# Patient Record
Sex: Male | Born: 1955 | Race: White | Hispanic: No | Smoking: Never smoker
Health system: Southern US, Community
[De-identification: ages and names within clinical notes are randomized; demographics above are authoritative.]

## PROBLEM LIST (undated history)

## (undated) DIAGNOSIS — Z973 Presence of spectacles and contact lenses: Secondary | ICD-10-CM

## (undated) DIAGNOSIS — C61 Malignant neoplasm of prostate: Secondary | ICD-10-CM

## (undated) DIAGNOSIS — Z8679 Personal history of other diseases of the circulatory system: Secondary | ICD-10-CM

## (undated) DIAGNOSIS — Z8781 Personal history of (healed) traumatic fracture: Secondary | ICD-10-CM

## (undated) DIAGNOSIS — R002 Palpitations: Secondary | ICD-10-CM

## (undated) DIAGNOSIS — Z87442 Personal history of urinary calculi: Secondary | ICD-10-CM

## (undated) DIAGNOSIS — I4891 Unspecified atrial fibrillation: Secondary | ICD-10-CM

## (undated) HISTORY — PX: APPENDECTOMY: SHX54

## (undated) HISTORY — PX: OTHER SURGICAL HISTORY: SHX169

---

## 1998-02-09 ENCOUNTER — Ambulatory Visit (HOSPITAL_COMMUNITY): Admission: RE | Admit: 1998-02-09 | Discharge: 1998-02-09 | Payer: Self-pay | Admitting: Orthopedic Surgery

## 2003-01-09 ENCOUNTER — Emergency Department (HOSPITAL_COMMUNITY): Admission: EM | Admit: 2003-01-09 | Discharge: 2003-01-10 | Payer: Self-pay | Admitting: Emergency Medicine

## 2003-03-08 ENCOUNTER — Emergency Department (HOSPITAL_COMMUNITY): Admission: EM | Admit: 2003-03-08 | Discharge: 2003-03-08 | Payer: Self-pay | Admitting: Emergency Medicine

## 2003-03-08 ENCOUNTER — Encounter: Payer: Self-pay | Admitting: Emergency Medicine

## 2015-01-25 ENCOUNTER — Ambulatory Visit: Payer: Self-pay | Admitting: Orthopedic Surgery

## 2015-01-26 ENCOUNTER — Ambulatory Visit (HOSPITAL_COMMUNITY)
Admission: RE | Admit: 2015-01-26 | Discharge: 2015-01-26 | Disposition: A | Discharge status: Home / Self Care | Payer: BLUE CROSS/BLUE SHIELD | Source: Ambulatory Visit | Attending: Orthopedic Surgery | Admitting: Orthopedic Surgery

## 2015-01-26 ENCOUNTER — Encounter (HOSPITAL_COMMUNITY): Payer: Self-pay

## 2015-01-26 ENCOUNTER — Ambulatory Visit: Payer: Self-pay | Admitting: Orthopedic Surgery

## 2015-01-26 ENCOUNTER — Encounter (HOSPITAL_COMMUNITY)
Admission: RE | Admit: 2015-01-26 | Discharge: 2015-01-26 | Disposition: A | Discharge status: Home / Self Care | Payer: BLUE CROSS/BLUE SHIELD | Source: Ambulatory Visit | Attending: Specialist | Admitting: Specialist

## 2015-01-26 DIAGNOSIS — M5126 Other intervertebral disc displacement, lumbar region: Secondary | ICD-10-CM | POA: Insufficient documentation

## 2015-01-26 DIAGNOSIS — Z01812 Encounter for preprocedural laboratory examination: Secondary | ICD-10-CM | POA: Diagnosis not present

## 2015-01-26 DIAGNOSIS — Z01818 Encounter for other preprocedural examination: Secondary | ICD-10-CM | POA: Diagnosis not present

## 2015-01-26 LAB — CBC
HCT: 49.5 % (ref 39.0–52.0)
HEMOGLOBIN: 16.8 g/dL (ref 13.0–17.0)
MCH: 31.3 pg (ref 26.0–34.0)
MCHC: 33.9 g/dL (ref 30.0–36.0)
MCV: 92.4 fL (ref 78.0–100.0)
Platelets: 191 10*3/uL (ref 150–400)
RBC: 5.36 MIL/uL (ref 4.22–5.81)
RDW: 12.8 % (ref 11.5–15.5)
WBC: 6.8 10*3/uL (ref 4.0–10.5)

## 2015-01-26 LAB — BASIC METABOLIC PANEL
Anion gap: 9 (ref 5–15)
BUN: 19 mg/dL (ref 6–23)
CALCIUM: 9.2 mg/dL (ref 8.4–10.5)
CO2: 29 mmol/L (ref 19–32)
CREATININE: 1.26 mg/dL (ref 0.50–1.35)
Chloride: 99 mmol/L (ref 96–112)
GFR calc Af Amer: 71 mL/min — ABNORMAL LOW (ref >90)
GFR calc non Af Amer: 61 mL/min — ABNORMAL LOW (ref >90)
Glucose, Bld: 100 mg/dL — ABNORMAL HIGH (ref 70–99)
Potassium: 4.5 mmol/L (ref 3.5–5.1)
SODIUM: 137 mmol/L (ref 135–145)

## 2015-01-26 LAB — SURGICAL PCR SCREEN
MRSA, PCR: NEGATIVE
Staphylococcus aureus: NEGATIVE

## 2015-01-26 NOTE — Progress Notes (Signed)
Your patient has screened at an elevated risk for Obstructive Sleep Apnea using the Stop-Bang Tool during a pre-surgical vist. A score of 4 or greater is an elevated risk. Score of 4. 

## 2015-01-26 NOTE — H&P (Signed)
Arthur Barrett is an 59 y.o. male.   Chief Complaint: back and L leg pain HPI: The patient is a 59 year old male who presents today for follow up of their back. The patient is being followed for their low back pain. They are now 4 week(s) out from injury. Symptoms reported today include: pain. Current treatment includes: NSAIDs (Mobic). The following medication has been used for pain control: Percocet. The patient presents today following MRI.  [name] follows up to discuss his MRI. He actually has a disc herniation at L5-S1, compressing his S1 nerve root. He reports increasing numbness and pain radiating down to the leg, requiring Percocet. He is here with his wife. He has significantly reduced his level of activity. Has not had much in terms of improvement.    No past medical history on file.  Past Surgical History  Procedure Laterality Date  . Appendectomy    . Colonscopy       No family history on file. Social History:  reports that he has never smoked. He has never used smokeless tobacco. He reports that he does not drink alcohol or use illicit drugs.  Allergies: No Known Allergies   (Not in a hospital admission)  Results for orders placed or performed during the hospital encounter of 01/26/15 (from the past 48 hour(s))  Surgical pcr screen     Status: None   Collection Time: 01/26/15  9:31 AM  Result Value Ref Range   MRSA, PCR NEGATIVE NEGATIVE   Staphylococcus aureus NEGATIVE NEGATIVE    Comment:        The Xpert SA Assay (FDA approved for NASAL specimens in patients over 65 years of age), is one component of a comprehensive surveillance program.  Test performance has been validated by [facility] for patients greater than or equal to 77 year old. It is not intended to diagnose infection nor to guide or monitor treatment.   Basic metabolic panel     Status: Abnormal   Collection Time: 01/26/15  9:50 AM  Result Value Ref Range   Sodium 137 135 - 145  mmol/L   Potassium 4.5 3.5 - 5.1 mmol/L   Chloride 99 96 - 112 mmol/L   CO2 29 19 - 32 mmol/L   Glucose, Bld 100 (H) 70 - 99 mg/dL   BUN 19 6 - 23 mg/dL   Creatinine, Ser 1.26 0.50 - 1.35 mg/dL   Calcium 9.2 8.4 - 10.5 mg/dL   GFR calc non Af Amer 61 (L) >90 mL/min   GFR calc Af Amer 71 (L) >90 mL/min    Comment: (NOTE) The eGFR has been calculated using the CKD EPI equation. This calculation has not been validated in all clinical situations. eGFR's persistently <90 mL/min signify possible Chronic Kidney Disease.    Anion gap 9 5 - 15  CBC     Status: None   Collection Time: 01/26/15  9:50 AM  Result Value Ref Range   WBC 6.8 4.0 - 10.5 K/uL   RBC 5.36 4.22 - 5.81 MIL/uL   Hemoglobin 16.8 13.0 - 17.0 g/dL   HCT 49.5 39.0 - 52.0 %   MCV 92.4 78.0 - 100.0 fL   MCH 31.3 26.0 - 34.0 pg   MCHC 33.9 30.0 - 36.0 g/dL   RDW 12.8 11.5 - 15.5 %   Platelets 191 150 - 400 K/uL   Dg Lumbar Spine 2-3 Views  01/26/2015   CLINICAL DATA:  59 year old male preoperative study for spine surgery.  Initial encounter.  EXAM: LUMBAR SPINE - 2-3 VIEW  COMPARISON:  None.  FINDINGS: Normal lumbar segmentation, with small ribs at T12 and full size ribs at T11. Mild to moderate dextro convex lumbar scoliosis. Chronic wedging of the L3 vertebral body. Incidental spina bifida occulta at L5. No acute osseous abnormality identified.  IMPRESSION: No acute osseous abnormality identified in the lumbar spine. Normal lumbar segmentation. The lumbar levels on these images were numbered in anticipation of surgery.   Electronically Signed   By: Genevie Ann M.D.   On: 01/26/2015 10:15    Review of Systems  Constitutional: Negative.   HENT: Negative.   Eyes: Negative.   Respiratory: Negative.   Cardiovascular: Negative.   Gastrointestinal: Negative.   Genitourinary: Negative.   Musculoskeletal: Positive for back pain.  Skin: Negative.   Neurological: Positive for sensory change and focal weakness.   Psychiatric/Behavioral: Negative.     There were no vitals taken for this visit. Physical Exam  Constitutional: He is oriented to person, place, and time. He appears well-developed and well-nourished.  HENT:  Head: Normocephalic and atraumatic.  Eyes: Conjunctivae and EOM are normal. Pupils are equal, round, and reactive to light.  Neck: Normal range of motion. Neck supple.  Cardiovascular: Normal rate and regular rhythm.   Respiratory: Effort normal and breath sounds normal.  GI: Soft. Bowel sounds are normal.  Musculoskeletal:  On exam, moderate distress. Walks with an antalgic gait. Mood and affect is anxious. His straight leg raises, buttocks, thigh, and calf pain on the left, negative on the right. He has an absent Achilles reflex on the left. He has decreased sensation on S1 dermatome. Diminished plantar flexion, EHL weakness. Pain will essentially remain in the lumbar spine. Lumbar spine exam reveals no evidence of soft tissue swelling, deformity or skin ecchymosis. On palpation there is no tenderness of the lumbar spine. No flank pain with percussion. The abdomen is soft and nontender. Nontender over the trochanters. No cellulitis or lymphadenopathy. Good range of motion of the lumbar spine. Straight leg raise is negative. Motor is 5/5 including tibialis anterior, quadriceps and hamstrings. Patient is normoreflexic. There is no Babinski or clonus. Patient has good distal pulses. No DVT. No pain and normal range of motion without instability of the hips, knees and ankles.   Neurological: He is alert and oriented to person, place, and time. He displays abnormal reflex.  Skin: Skin is warm and dry.    MRI demonstrates a paracentral disc herniation at L5-S1 with an extruded fragment compressing the S1 nerve root. There is perhaps a Schmorl node at S1. L3 has a chronic fracture.  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  Given the severity of this pain, the presence of a neurologic deficit as well as a neural compressive lesion at four weeks status post, it would be wise to consider lumbar decompressions.  I had an extensive discussion of the risks and benefits of the lumbar decompression with the patient including bleeding, infection, damage to neurovascular structures, epidural fibrosis, CSF leak requiring repair. We also discussed increase in pain, adjacent segment disease, recurrent disc herniation, need for future surgery including repeat decompression and/or fusion. We also discussed risks of postoperative hematoma, paralysis, anesthetic complications including DVT, PE, death, cardiopulmonary dysfunction. In addition, the perioperative and postoperative courses were discussed in detail including the rehabilitative time and  return to functional activity and work. I provided the patient with an illustrated handout and utilized the appropriate surgical models.  I have sent him to physical therapy first for a visit to see if there is any help that can do with a Mckenzie type of an approach, but he has been fairly painful. His L3 fracture is old and is not new. I discussed this with he and his wife. I did discuss the postoperative course in detail overnight in the hospital. We did discuss it would take some time for the nerve to recover. I do feel it will.  Plan microlumbar decompression L5-S1 left  Aundrey Elahi M. [name] for Dr. Tonita Cong 01/26/2015, 2:31 PM

## 2015-01-26 NOTE — Patient Instructions (Signed)
[place]  01/26/2015   Your procedure is scheduled on:    Wednesday January 31, 2015   Report to [facility] Main Entrance and follow signs to  [place] arrive at 9:30 AM.   Call this number if you have problems the morning of surgery [phone] or Presurgical Testing [phone].   Remember:  Do not eat food or drink liquids :After Midnight.  For Living Will and/or Health Care Power Attorney Forms: please provide copy for your medical record, may bring AM of surgery (forms should be already notarized-we do not provide this service).     Take these medicines the morning of surgery with A SIP OF WATER: Oxycodone-Acetaminophen if needed                               You may not have any metal on your body including hair pins and piercings  Do not wear jewelry, colognes,lotions, powders, or deodorant.  Men may shave face and neck.               Do not bring valuables to the hospital. [place].  Contacts, dentures or bridgework may not be worn into surgery.  Leave suitcase in the car. After surgery it may be brought to your room.  For patients admitted to the hospital, checkout time is 11:00 AM the day of discharge.     Special Instructions: review fact sheets for MRSA information, Incentive Spirometry. ________________________________________________________________________  [facility] - Preparing for Surgery Before surgery, you can play an important role.  Because skin is not sterile, your skin needs to be as free of germs as possible.  You can reduce the number of germs on your skin by washing with CHG (chlorahexidine gluconate) soap before surgery.  CHG is an antiseptic cleaner which kills germs and bonds with the skin to continue killing germs even after washing. Please DO NOT use if you have an allergy to CHG or antibacterial soaps.  If your skin becomes reddened/irritated stop using the CHG and inform your nurse when  you arrive at Short Stay. Do not shave (including legs and underarms) for at least 48 hours prior to the first CHG shower.  You may shave your face/neck. Please follow these instructions carefully:  1.  Shower with CHG Soap the night before surgery and the  morning of Surgery.  2.  If you choose to wash your hair, wash your hair first as usual with your  normal  shampoo.  3.  After you shampoo, rinse your hair and body thoroughly to remove the  shampoo.                           4.  Use CHG as you would any other liquid soap.  You can apply chg directly  to the skin and wash                       Gently with a scrungie or clean washcloth.  5.  Apply the CHG Soap to your body ONLY FROM THE NECK DOWN.   Do not use on face/ open                           Wound or open sores. Avoid contact with eyes, ears mouth and genitals (private  parts).                       Wash face,  Genitals (private parts) with your normal soap.             6.  Wash thoroughly, paying special attention to the area where your surgery  will be performed.  7.  Thoroughly rinse your body with warm water from the neck down.  8.  DO NOT shower/wash with your normal soap after using and rinsing off  the CHG Soap.                9.  Pat yourself dry with a clean towel.            10.  Wear clean pajamas.            11.  Place clean sheets on your bed the night of your first shower and do not  sleep with pets. Day of Surgery : Do not apply any lotions/deodorants the morning of surgery.  Please wear clean clothes to the hospital/surgery center.  FAILURE TO FOLLOW THESE INSTRUCTIONS MAY RESULT IN THE CANCELLATION OF YOUR SURGERY PATIENT SIGNATURE_________________________________  NURSE SIGNATURE__________________________________  ________________________________________________________________________   Adam Phenix  An incentive spirometer is a tool that can help keep your lungs clear and active. This tool measures  how well you are filling your lungs with each breath. Taking long deep breaths may help reverse or decrease the chance of developing breathing (pulmonary) problems (especially infection) following:  A long period of time when you are unable to move or be active. BEFORE THE PROCEDURE   If the spirometer includes an indicator to show your best effort, your nurse or respiratory therapist will set it to a desired goal.  If possible, sit up straight or lean slightly forward. Try not to slouch.  Hold the incentive spirometer in an upright position. INSTRUCTIONS FOR USE  1. Sit on the edge of your bed if possible, or sit up as far as you can in bed or on a chair. 2. Hold the incentive spirometer in an upright position. 3. Breathe out normally. 4. Place the mouthpiece in your mouth and seal your lips tightly around it. 5. Breathe in slowly and as deeply as possible, raising the piston or the ball toward the top of the column. 6. Hold your breath for 3-5 seconds or for as long as possible. Allow the piston or ball to fall to the bottom of the column. 7. Remove the mouthpiece from your mouth and breathe out normally. 8. Rest for a few seconds and repeat Steps 1 through 7 at least 10 times every 1-2 hours when you are awake. Take your time and take a few normal breaths between deep breaths. 9. The spirometer may include an indicator to show your best effort. Use the indicator as a goal to work toward during each repetition. 10. After each set of 10 deep breaths, practice coughing to be sure your lungs are clear. If you have an incision (the cut made at the time of surgery), support your incision when coughing by placing a pillow or rolled up towels firmly against it. Once you are able to get out of bed, walk around indoors and cough well. You may stop using the incentive spirometer when instructed by your caregiver.  RISKS AND COMPLICATIONS  Take your time so you do not get dizzy or light-headed.  If  you are in pain, you may need  to take or ask for pain medication before doing incentive spirometry. It is harder to take a deep breath if you are having pain. AFTER USE  Rest and breathe slowly and easily.  It can be helpful to keep track of a log of your progress. Your caregiver can provide you with a simple table to help with this. If you are using the spirometer at home, follow these instructions: [place] IF:   You are having difficultly using the spirometer.  You have trouble using the spirometer as often as instructed.  Your pain medication is not giving enough relief while using the spirometer.  You develop fever of 100.5 F (38.1 C) or higher. SEEK IMMEDIATE MEDICAL CARE IF:   You cough up bloody sputum that had not been present before.  You develop fever of 102 F (38.9 C) or greater.  You develop worsening pain at or near the incision site. MAKE SURE YOU:   Understand these instructions.  Will watch your condition.  Will get help right away if you are not doing well or get worse. Document Released: 02/16/2007 Document Revised: 12/29/2011 Document Reviewed: 04/19/2007 [facility] Patient Information 2014 [place], [place].   ________________________________________________________________________

## 2015-01-31 ENCOUNTER — Ambulatory Visit (HOSPITAL_COMMUNITY): Payer: BLUE CROSS/BLUE SHIELD | Admitting: Certified Registered Nurse Anesthetist

## 2015-01-31 ENCOUNTER — Encounter (HOSPITAL_COMMUNITY)
Admission: RE | Disposition: A | Discharge status: Home / Self Care | Payer: Self-pay | Source: Ambulatory Visit | Attending: Specialist

## 2015-01-31 ENCOUNTER — Ambulatory Visit (HOSPITAL_COMMUNITY): Payer: BLUE CROSS/BLUE SHIELD

## 2015-01-31 ENCOUNTER — Encounter (HOSPITAL_COMMUNITY): Payer: Self-pay | Admitting: Anesthesiology

## 2015-01-31 ENCOUNTER — Ambulatory Visit (HOSPITAL_COMMUNITY)
Admission: RE | Admit: 2015-01-31 | Discharge: 2015-02-01 | Disposition: A | Discharge status: Home / Self Care | Payer: BLUE CROSS/BLUE SHIELD | Source: Ambulatory Visit | Attending: Specialist | Admitting: Specialist

## 2015-01-31 DIAGNOSIS — M4806 Spinal stenosis, lumbar region: Secondary | ICD-10-CM | POA: Insufficient documentation

## 2015-01-31 DIAGNOSIS — M5127 Other intervertebral disc displacement, lumbosacral region: Secondary | ICD-10-CM | POA: Insufficient documentation

## 2015-01-31 DIAGNOSIS — M48061 Spinal stenosis, lumbar region without neurogenic claudication: Secondary | ICD-10-CM | POA: Diagnosis present

## 2015-01-31 DIAGNOSIS — M79605 Pain in left leg: Secondary | ICD-10-CM | POA: Diagnosis present

## 2015-01-31 DIAGNOSIS — Z419 Encounter for procedure for purposes other than remedying health state, unspecified: Secondary | ICD-10-CM

## 2015-01-31 HISTORY — PX: LUMBAR LAMINECTOMY/DECOMPRESSION MICRODISCECTOMY: SHX5026

## 2015-01-31 SURGERY — LUMBAR LAMINECTOMY/DECOMPRESSION MICRODISCECTOMY 1 LEVEL
Anesthesia: General | Site: Back | Laterality: Left

## 2015-01-31 MED ORDER — METHOCARBAMOL 1000 MG/10ML IJ SOLN
500.0000 mg | Freq: Four times a day (QID) | INTRAVENOUS | Status: DC | PRN
  Filled 2015-01-31: qty 5

## 2015-01-31 MED ORDER — NEOSTIGMINE METHYLSULFATE 10 MG/10ML IV SOLN
INTRAVENOUS | Status: DC | PRN
  Administered 2015-01-31: 4 mg via INTRAVENOUS

## 2015-01-31 MED ORDER — SENNOSIDES-DOCUSATE SODIUM 8.6-50 MG PO TABS: 1.0000 | ORAL_TABLET | Freq: Every evening | ORAL | Status: DC | PRN

## 2015-01-31 MED ORDER — ACETAMINOPHEN 325 MG PO TABS: 650.0000 mg | ORAL_TABLET | ORAL | Status: DC | PRN

## 2015-01-31 MED ORDER — CEFAZOLIN SODIUM-DEXTROSE 2-3 GM-% IV SOLR
2.0000 g | Freq: Three times a day (TID) | INTRAVENOUS | Status: DC
  Administered 2015-01-31 – 2015-02-01 (×2): 2 g via INTRAVENOUS
  Filled 2015-01-31 (×3): qty 50

## 2015-01-31 MED ORDER — SODIUM CHLORIDE 0.9 % IJ SOLN
INTRAMUSCULAR | Status: AC
  Filled 2015-01-31: qty 10

## 2015-01-31 MED ORDER — METHOCARBAMOL 500 MG PO TABS: 500.0000 mg | ORAL_TABLET | Freq: Three times a day (TID) | ORAL | Status: DC | PRN

## 2015-01-31 MED ORDER — SUCCINYLCHOLINE CHLORIDE 20 MG/ML IJ SOLN
INTRAMUSCULAR | Status: DC | PRN
  Administered 2015-01-31: 100 mg via INTRAVENOUS

## 2015-01-31 MED ORDER — OXYCODONE-ACETAMINOPHEN 5-325 MG PO TABS
1.0000 | ORAL_TABLET | ORAL | Status: DC | PRN
  Administered 2015-02-01 (×2): 1 via ORAL
  Filled 2015-01-31 (×2): qty 1

## 2015-01-31 MED ORDER — BISACODYL 5 MG PO TBEC: 5.0000 mg | DELAYED_RELEASE_TABLET | Freq: Every day | ORAL | Status: DC | PRN

## 2015-01-31 MED ORDER — CEFAZOLIN SODIUM-DEXTROSE 2-3 GM-% IV SOLR
INTRAVENOUS | Status: AC
  Filled 2015-01-31: qty 50

## 2015-01-31 MED ORDER — BUPIVACAINE-EPINEPHRINE 0.5% -1:200000 IJ SOLN
INTRAMUSCULAR | Status: DC | PRN
  Administered 2015-01-31: 11 mL

## 2015-01-31 MED ORDER — MIDAZOLAM HCL 2 MG/2ML IJ SOLN
INTRAMUSCULAR | Status: AC
  Filled 2015-01-31: qty 2

## 2015-01-31 MED ORDER — ALUM & MAG HYDROXIDE-SIMETH 200-200-20 MG/5ML PO SUSP: 30.0000 mL | Freq: Four times a day (QID) | ORAL | Status: DC | PRN

## 2015-01-31 MED ORDER — DOCUSATE SODIUM 100 MG PO CAPS: 100.0000 mg | ORAL_CAPSULE | Freq: Two times a day (BID) | ORAL | Status: DC | PRN

## 2015-01-31 MED ORDER — ROCURONIUM BROMIDE 100 MG/10ML IV SOLN
INTRAVENOUS | Status: AC
  Filled 2015-01-31: qty 1

## 2015-01-31 MED ORDER — GLYCOPYRROLATE 0.2 MG/ML IJ SOLN
INTRAMUSCULAR | Status: DC | PRN
  Administered 2015-01-31: 0.6 mg via INTRAVENOUS

## 2015-01-31 MED ORDER — THROMBIN 5000 UNITS EX SOLR
OROMUCOSAL | Status: DC | PRN
  Administered 2015-01-31: 10000 mL via TOPICAL

## 2015-01-31 MED ORDER — SODIUM CHLORIDE 0.9 % IR SOLN
Status: AC
  Filled 2015-01-31: qty 1

## 2015-01-31 MED ORDER — MIDAZOLAM HCL 5 MG/5ML IJ SOLN
INTRAMUSCULAR | Status: DC | PRN
  Administered 2015-01-31: 2 mg via INTRAVENOUS

## 2015-01-31 MED ORDER — PROPOFOL 10 MG/ML IV BOLUS
INTRAVENOUS | Status: DC | PRN
  Administered 2015-01-31: 170 mg via INTRAVENOUS

## 2015-01-31 MED ORDER — OXYCODONE-ACETAMINOPHEN 5-325 MG PO TABS: 1.0000 | ORAL_TABLET | ORAL | Status: DC | PRN

## 2015-01-31 MED ORDER — EPHEDRINE SULFATE 50 MG/ML IJ SOLN
INTRAMUSCULAR | Status: DC | PRN
  Administered 2015-01-31: 10 mg via INTRAVENOUS
  Administered 2015-01-31 (×3): 5 mg via INTRAVENOUS

## 2015-01-31 MED ORDER — SODIUM CHLORIDE 0.9 % IR SOLN
Status: DC | PRN
  Administered 2015-01-31: 500 mL

## 2015-01-31 MED ORDER — ONDANSETRON HCL 4 MG/2ML IJ SOLN
INTRAMUSCULAR | Status: DC | PRN
  Administered 2015-01-31: 4 mg via INTRAVENOUS

## 2015-01-31 MED ORDER — ONDANSETRON HCL 4 MG/2ML IJ SOLN
INTRAMUSCULAR | Status: AC
  Filled 2015-01-31: qty 2

## 2015-01-31 MED ORDER — DOCUSATE SODIUM 100 MG PO CAPS
100.0000 mg | ORAL_CAPSULE | Freq: Two times a day (BID) | ORAL | Status: DC
  Administered 2015-01-31 – 2015-02-01 (×2): 100 mg via ORAL

## 2015-01-31 MED ORDER — DIPHENHYDRAMINE HCL 25 MG PO TABS
25.0000 mg | ORAL_TABLET | Freq: Every day | ORAL | Status: DC
  Administered 2015-01-31: 25 mg via ORAL
  Filled 2015-01-31 (×2): qty 1

## 2015-01-31 MED ORDER — GLYCOPYRROLATE 0.2 MG/ML IJ SOLN
INTRAMUSCULAR | Status: AC
  Filled 2015-01-31: qty 3

## 2015-01-31 MED ORDER — PHENOL 1.4 % MT LIQD
1.0000 | OROMUCOSAL | Status: DC | PRN
Start: 2015-01-31 — End: 2015-02-01

## 2015-01-31 MED ORDER — EPHEDRINE SULFATE 50 MG/ML IJ SOLN
INTRAMUSCULAR | Status: AC
  Filled 2015-01-31: qty 1

## 2015-01-31 MED ORDER — CEFAZOLIN SODIUM-DEXTROSE 2-3 GM-% IV SOLR
2.0000 g | INTRAVENOUS | Status: AC
  Administered 2015-01-31: 2 g via INTRAVENOUS

## 2015-01-31 MED ORDER — KCL IN DEXTROSE-NACL 20-5-0.45 MEQ/L-%-% IV SOLN
INTRAVENOUS | Status: DC
  Administered 2015-01-31: 18:00:00 via INTRAVENOUS
  Filled 2015-01-31 (×2): qty 1000

## 2015-01-31 MED ORDER — LACTATED RINGERS IV SOLN
INTRAVENOUS | Status: DC
  Administered 2015-01-31 (×2): via INTRAVENOUS

## 2015-01-31 MED ORDER — ACETAMINOPHEN 650 MG RE SUPP: 650.0000 mg | RECTAL | Status: DC | PRN

## 2015-01-31 MED ORDER — MAGNESIUM CITRATE PO SOLN: 1.0000 | Freq: Once | ORAL | Status: AC | PRN

## 2015-01-31 MED ORDER — ONDANSETRON HCL 4 MG/2ML IJ SOLN: 4.0000 mg | INTRAMUSCULAR | Status: DC | PRN

## 2015-01-31 MED ORDER — FENTANYL CITRATE 0.05 MG/ML IJ SOLN
INTRAMUSCULAR | Status: AC
  Filled 2015-01-31: qty 5

## 2015-01-31 MED ORDER — ACETAMINOPHEN 10 MG/ML IV SOLN
1000.0000 mg | Freq: Once | INTRAVENOUS | Status: AC
  Administered 2015-01-31: 1000 mg via INTRAVENOUS
  Filled 2015-01-31: qty 100

## 2015-01-31 MED ORDER — LIDOCAINE HCL (CARDIAC) 20 MG/ML IV SOLN
INTRAVENOUS | Status: AC
  Filled 2015-01-31: qty 5

## 2015-01-31 MED ORDER — HYDROMORPHONE HCL 1 MG/ML IJ SOLN: 0.5000 mg | INTRAMUSCULAR | Status: DC | PRN

## 2015-01-31 MED ORDER — FENTANYL CITRATE 0.05 MG/ML IJ SOLN
INTRAMUSCULAR | Status: DC | PRN
  Administered 2015-01-31: 50 ug via INTRAVENOUS
  Administered 2015-01-31: 100 ug via INTRAVENOUS
  Administered 2015-01-31 (×2): 50 ug via INTRAVENOUS

## 2015-01-31 MED ORDER — LORATADINE 10 MG PO TABS
10.0000 mg | ORAL_TABLET | Freq: Every day | ORAL | Status: DC
  Administered 2015-02-01: 10 mg via ORAL
  Filled 2015-01-31 (×2): qty 1

## 2015-01-31 MED ORDER — MENTHOL 3 MG MT LOZG: 1.0000 | LOZENGE | OROMUCOSAL | Status: DC | PRN

## 2015-01-31 MED ORDER — HYDROMORPHONE HCL 1 MG/ML IJ SOLN: 0.2500 mg | INTRAMUSCULAR | Status: DC | PRN

## 2015-01-31 MED ORDER — BUPIVACAINE-EPINEPHRINE (PF) 0.5% -1:200000 IJ SOLN
INTRAMUSCULAR | Status: AC
  Filled 2015-01-31: qty 30

## 2015-01-31 MED ORDER — LIDOCAINE HCL (CARDIAC) 20 MG/ML IV SOLN
INTRAVENOUS | Status: DC | PRN
  Administered 2015-01-31: 80 mg via INTRAVENOUS

## 2015-01-31 MED ORDER — HYDROCODONE-ACETAMINOPHEN 5-325 MG PO TABS: 1.0000 | ORAL_TABLET | ORAL | Status: DC | PRN

## 2015-01-31 MED ORDER — METHOCARBAMOL 500 MG PO TABS: 500.0000 mg | ORAL_TABLET | Freq: Four times a day (QID) | ORAL | Status: DC | PRN

## 2015-01-31 MED ORDER — ROCURONIUM BROMIDE 100 MG/10ML IV SOLN
INTRAVENOUS | Status: DC | PRN
  Administered 2015-01-31: 40 mg via INTRAVENOUS

## 2015-01-31 MED ORDER — THROMBIN 5000 UNITS EX SOLR
CUTANEOUS | Status: AC
  Filled 2015-01-31: qty 10000

## 2015-01-31 MED ORDER — PHENYLEPHRINE HCL 10 MG/ML IJ SOLN
INTRAMUSCULAR | Status: DC | PRN
  Administered 2015-01-31: 80 ug via INTRAVENOUS
  Administered 2015-01-31: 40 ug via INTRAVENOUS
  Administered 2015-01-31: 240 ug via INTRAVENOUS
  Administered 2015-01-31 (×2): 120 ug via INTRAVENOUS
  Administered 2015-01-31: 40 ug via INTRAVENOUS
  Administered 2015-01-31: 80 ug via INTRAVENOUS

## 2015-01-31 MED ORDER — NEOSTIGMINE METHYLSULFATE 10 MG/10ML IV SOLN
INTRAVENOUS | Status: AC
  Filled 2015-01-31: qty 1

## 2015-01-31 MED ORDER — PROPOFOL 10 MG/ML IV BOLUS
INTRAVENOUS | Status: AC
  Filled 2015-01-31: qty 20

## 2015-01-31 SURGICAL SUPPLY — 47 items
BAG ZIPLOCK 12X15 (MISCELLANEOUS) IMPLANT
CLEANER TIP ELECTROSURG 2X2 (MISCELLANEOUS) ×3 IMPLANT
CLOSURE WOUND 1/2 X4 (GAUZE/BANDAGES/DRESSINGS) ×1
CLOTH 2% CHLOROHEXIDINE 3PK (PERSONAL CARE ITEMS) ×3 IMPLANT
DRAPE MICROSCOPE LEICA (MISCELLANEOUS) ×3 IMPLANT
DRAPE POUCH INSTRU U-SHP 10X18 (DRAPES) ×3 IMPLANT
DRAPE SURG 17X11 SM STRL (DRAPES) ×3 IMPLANT
DRAPE UTILITY XL STRL (DRAPES) ×3 IMPLANT
DRSG AQUACEL AG ADV 3.5X 4 (GAUZE/BANDAGES/DRESSINGS) ×3 IMPLANT
DRSG AQUACEL AG ADV 3.5X 6 (GAUZE/BANDAGES/DRESSINGS) IMPLANT
DURAPREP 26ML APPLICATOR (WOUND CARE) ×3 IMPLANT
DURASEAL SPINE SEALANT 3ML (MISCELLANEOUS) IMPLANT
ELECT BLADE TIP CTD 4 INCH (ELECTRODE) IMPLANT
ELECT REM PT RETURN 9FT ADLT (ELECTROSURGICAL) ×3
ELECTRODE REM PT RTRN 9FT ADLT (ELECTROSURGICAL) ×1 IMPLANT
GLOVE BIOGEL PI IND STRL 7.5 (GLOVE) ×1 IMPLANT
GLOVE BIOGEL PI IND STRL 8 (GLOVE) ×1 IMPLANT
GLOVE BIOGEL PI INDICATOR 7.5 (GLOVE) ×2
GLOVE BIOGEL PI INDICATOR 8 (GLOVE) ×2
GLOVE SURG SS PI 7.5 STRL IVOR (GLOVE) ×3 IMPLANT
GLOVE SURG SS PI 8.0 STRL IVOR (GLOVE) ×9 IMPLANT
GOWN BRE IMP PREV XXLGXLNG (GOWN DISPOSABLE) ×3 IMPLANT
GOWN STRL REUS W/TWL XL LVL3 (GOWN DISPOSABLE) ×6 IMPLANT
IV CATH 14GX2 1/4 (CATHETERS) ×3 IMPLANT
KIT BASIN OR (CUSTOM PROCEDURE TRAY) ×3 IMPLANT
KIT POSITIONING SURG ANDREWS (MISCELLANEOUS) ×3 IMPLANT
MANIFOLD NEPTUNE II (INSTRUMENTS) ×3 IMPLANT
NEEDLE SPNL 18GX3.5 QUINCKE PK (NEEDLE) ×6 IMPLANT
PACK LAMINECTOMY ORTHO (CUSTOM PROCEDURE TRAY) ×3 IMPLANT
PATTIES SURGICAL .5 X.5 (GAUZE/BANDAGES/DRESSINGS) IMPLANT
PATTIES SURGICAL .75X.75 (GAUZE/BANDAGES/DRESSINGS) ×3 IMPLANT
PATTIES SURGICAL 1X1 (DISPOSABLE) IMPLANT
SPONGE SURGIFOAM ABS GEL 100 (HEMOSTASIS) ×3 IMPLANT
STAPLER VISISTAT (STAPLE) IMPLANT
STRIP CLOSURE SKIN 1/2X4 (GAUZE/BANDAGES/DRESSINGS) ×2 IMPLANT
SUT NURALON 4 0 TR CR/8 (SUTURE) IMPLANT
SUT PROLENE 3 0 PS 2 (SUTURE) ×3 IMPLANT
SUT VIC AB 1 CT1 27 (SUTURE) ×2
SUT VIC AB 1 CT1 27XBRD ANTBC (SUTURE) ×1 IMPLANT
SUT VIC AB 1-0 CT2 27 (SUTURE) ×3 IMPLANT
SUT VIC AB 2-0 CT1 27 (SUTURE)
SUT VIC AB 2-0 CT1 TAPERPNT 27 (SUTURE) IMPLANT
SUT VIC AB 2-0 CT2 27 (SUTURE) ×3 IMPLANT
SYR 3ML LL SCALE MARK (SYRINGE) ×3 IMPLANT
TOWEL OR 17X26 10 PK STRL BLUE (TOWEL DISPOSABLE) ×3 IMPLANT
TOWEL OR NON WOVEN STRL DISP B (DISPOSABLE) ×3 IMPLANT
YANKAUER SUCT BULB TIP NO VENT (SUCTIONS) ×3 IMPLANT

## 2015-01-31 NOTE — Transfer of Care (Signed)
Immediate Anesthesia Transfer of Care Note  Patient: Arthur Barrett  Procedure(s) Performed: Procedure(s): MICRO LUMBAR DECOMPRESSION LUMBAR FIVE TO SACRAL ONE ON LEFT (Left)  Patient Location: PACU  Anesthesia Type:General  Level of Consciousness:  sedated, patient cooperative and responds to stimulation  Airway & Oxygen Therapy:Patient Spontanous Breathing and Patient connected to face mask oxgen  Post-op Assessment:  Report given to PACU RN and Post -op Vital signs reviewed and stable  Post vital signs:  Reviewed and stable  Last Vitals:  Filed Vitals:   01/31/15 1006  BP:   Pulse: 60  Temp:   Resp:     Complications: No apparent anesthesia complications

## 2015-01-31 NOTE — H&P (View-Only) (Signed)
Basil Buffin is an 59 y.o. male.   Chief Complaint: back and L leg pain HPI: The patient is a 59 year old male who presents today for follow up of their back. The patient is being followed for their low back pain. They are now 4 week(s) out from injury. Symptoms reported today include: pain. Current treatment includes: NSAIDs (Mobic). The following medication has been used for pain control: Percocet. The patient presents today following MRI.  [name] follows up to discuss his MRI. He actually has a disc herniation at L5-S1, compressing his S1 nerve root. He reports increasing numbness and pain radiating down to the leg, requiring Percocet. He is here with his wife. He has significantly reduced his level of activity. Has not had much in terms of improvement.    No past medical history on file.  Past Surgical History  Procedure Laterality Date  . Appendectomy    . Colonscopy       No family history on file. Social History:  reports that he has never smoked. He has never used smokeless tobacco. He reports that he does not drink alcohol or use illicit drugs.  Allergies: No Known Allergies   (Not in a hospital admission)  Results for orders placed or performed during the hospital encounter of 01/26/15 (from the past 48 hour(s))  Surgical pcr screen     Status: None   Collection Time: 01/26/15  9:31 AM  Result Value Ref Range   MRSA, PCR NEGATIVE NEGATIVE   Staphylococcus aureus NEGATIVE NEGATIVE    Comment:        The Xpert SA Assay (FDA approved for NASAL specimens in patients over 65 years of age), is one component of a comprehensive surveillance program.  Test performance has been validated by [facility] for patients greater than or equal to 77 year old. It is not intended to diagnose infection nor to guide or monitor treatment.   Basic metabolic panel     Status: Abnormal   Collection Time: 01/26/15  9:50 AM  Result Value Ref Range   Sodium 137 135 - 145  mmol/L   Potassium 4.5 3.5 - 5.1 mmol/L   Chloride 99 96 - 112 mmol/L   CO2 29 19 - 32 mmol/L   Glucose, Bld 100 (H) 70 - 99 mg/dL   BUN 19 6 - 23 mg/dL   Creatinine, Ser 1.26 0.50 - 1.35 mg/dL   Calcium 9.2 8.4 - 10.5 mg/dL   GFR calc non Af Amer 61 (L) >90 mL/min   GFR calc Af Amer 71 (L) >90 mL/min    Comment: (NOTE) The eGFR has been calculated using the CKD EPI equation. This calculation has not been validated in all clinical situations. eGFR's persistently <90 mL/min signify possible Chronic Kidney Disease.    Anion gap 9 5 - 15  CBC     Status: None   Collection Time: 01/26/15  9:50 AM  Result Value Ref Range   WBC 6.8 4.0 - 10.5 K/uL   RBC 5.36 4.22 - 5.81 MIL/uL   Hemoglobin 16.8 13.0 - 17.0 g/dL   HCT 49.5 39.0 - 52.0 %   MCV 92.4 78.0 - 100.0 fL   MCH 31.3 26.0 - 34.0 pg   MCHC 33.9 30.0 - 36.0 g/dL   RDW 12.8 11.5 - 15.5 %   Platelets 191 150 - 400 K/uL   Dg Lumbar Spine 2-3 Views  01/26/2015   CLINICAL DATA:  59 year old male preoperative study for spine surgery.  Initial encounter.  EXAM: LUMBAR SPINE - 2-3 VIEW  COMPARISON:  None.  FINDINGS: Normal lumbar segmentation, with small ribs at T12 and full size ribs at T11. Mild to moderate dextro convex lumbar scoliosis. Chronic wedging of the L3 vertebral body. Incidental spina bifida occulta at L5. No acute osseous abnormality identified.  IMPRESSION: No acute osseous abnormality identified in the lumbar spine. Normal lumbar segmentation. The lumbar levels on these images were numbered in anticipation of surgery.   Electronically Signed   By: Genevie Ann M.D.   On: 01/26/2015 10:15    Review of Systems  Constitutional: Negative.   HENT: Negative.   Eyes: Negative.   Respiratory: Negative.   Cardiovascular: Negative.   Gastrointestinal: Negative.   Genitourinary: Negative.   Musculoskeletal: Positive for back pain.  Skin: Negative.   Neurological: Positive for sensory change and focal weakness.   Psychiatric/Behavioral: Negative.     There were no vitals taken for this visit. Physical Exam  Constitutional: He is oriented to person, place, and time. He appears well-developed and well-nourished.  HENT:  Head: Normocephalic and atraumatic.  Eyes: Conjunctivae and EOM are normal. Pupils are equal, round, and reactive to light.  Neck: Normal range of motion. Neck supple.  Cardiovascular: Normal rate and regular rhythm.   Respiratory: Effort normal and breath sounds normal.  GI: Soft. Bowel sounds are normal.  Musculoskeletal:  On exam, moderate distress. Walks with an antalgic gait. Mood and affect is anxious. His straight leg raises, buttocks, thigh, and calf pain on the left, negative on the right. He has an absent Achilles reflex on the left. He has decreased sensation on S1 dermatome. Diminished plantar flexion, EHL weakness. Pain will essentially remain in the lumbar spine. Lumbar spine exam reveals no evidence of soft tissue swelling, deformity or skin ecchymosis. On palpation there is no tenderness of the lumbar spine. No flank pain with percussion. The abdomen is soft and nontender. Nontender over the trochanters. No cellulitis or lymphadenopathy. Good range of motion of the lumbar spine. Straight leg raise is negative. Motor is 5/5 including tibialis anterior, quadriceps and hamstrings. Patient is normoreflexic. There is no Babinski or clonus. Patient has good distal pulses. No DVT. No pain and normal range of motion without instability of the hips, knees and ankles.   Neurological: He is alert and oriented to person, place, and time. He displays abnormal reflex.  Skin: Skin is warm and dry.    MRI demonstrates a paracentral disc herniation at L5-S1 with an extruded fragment compressing the S1 nerve root. There is perhaps a Schmorl node at S1. L3 has a chronic fracture.  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  Given the severity of this pain, the presence of a neurologic deficit as well as a neural compressive lesion at four weeks status post, it would be wise to consider lumbar decompressions.  I had an extensive discussion of the risks and benefits of the lumbar decompression with the patient including bleeding, infection, damage to neurovascular structures, epidural fibrosis, CSF leak requiring repair. We also discussed increase in pain, adjacent segment disease, recurrent disc herniation, need for future surgery including repeat decompression and/or fusion. We also discussed risks of postoperative hematoma, paralysis, anesthetic complications including DVT, PE, death, cardiopulmonary dysfunction. In addition, the perioperative and postoperative courses were discussed in detail including the rehabilitative time and  return to functional activity and work. I provided the patient with an illustrated handout and utilized the appropriate surgical models.  I have sent him to physical therapy first for a visit to see if there is any help that can do with a Mckenzie type of an approach, but he has been fairly painful. His L3 fracture is old and is not new. I discussed this with he and his wife. I did discuss the postoperative course in detail overnight in the hospital. We did discuss it would take some time for the nerve to recover. I do feel it will.  Plan microlumbar decompression L5-S1 left  Bassheva Flury M. [name] for Dr. Tonita Cong 01/26/2015, 2:31 PM

## 2015-01-31 NOTE — Brief Op Note (Signed)
01/31/2015  1:01 PM  PATIENT:  Din Bookwalter  59 y.o. male  PRE-OPERATIVE DIAGNOSIS:  HNP L5-S1 left  POST-OPERATIVE DIAGNOSIS:  HNP L5-S1 left  PROCEDURE:  Procedure(s): MICRO LUMBAR DECOMPRESSION LUMBAR FIVE TO SACRAL ONE ON LEFT (Left)  SURGEON:  Surgeon(s) and Role:    * Susa Day, MD - Primary  PHYSICIAN ASSISTANT:   ASSISTANTS: [place]   ANESTHESIA:   general  EBL:  Total I/O In: 1000 [I.V.:1000] Out: -   BLOOD ADMINISTERED:none  DRAINS: none   LOCAL MEDICATIONS USED:  MARCAINE     SPECIMEN:  Source of Specimen:  L5S1  DISPOSITION OF SPECIMEN:  PATHOLOGY  COUNTS:  YES  TOURNIQUET:  * No tourniquets in log *  DICTATION: .Other Dictation: Dictation Number [id number]  PLAN OF CARE: Admit for overnight observation  PATIENT DISPOSITION:  PACU - hemodynamically stable.   Delay start of Pharmacological VTE agent (>24hrs) due to surgical blood loss or risk of bleeding: yes

## 2015-01-31 NOTE — Interval H&P Note (Signed)
History and Physical Interval Note:  01/31/2015 7:29 AM  Arthur Barrett  has presented today for surgery, with the diagnosis of HP L5-S1  The various methods of treatment have been discussed with the patient and family. After consideration of risks, benefits and other options for treatment, the patient has consented to  Procedure(s): LUMBAR LAMINECTOMY/DECOMPRESSION MICRODISCECTOMY 1 LEVEL L5-S1 ON LEFT (Left) as a surgical intervention .  The patient's history has been reviewed, patient examined, no change in status, stable for surgery.  I have reviewed the patient's chart and labs.  Questions were answered to the patient's satisfaction.     Elva Mauro C

## 2015-01-31 NOTE — Anesthesia Preprocedure Evaluation (Signed)
Anesthesia Evaluation  Patient identified by MRN, date of birth, ID band Patient awake    Reviewed: Allergy & Precautions, NPO status , Patient's Chart, lab work & pertinent test results  Airway Mallampati: II  TM Distance: >3 FB Neck ROM: Full    Dental no notable dental hx.    Pulmonary neg pulmonary ROS,  breath sounds clear to auscultation  Pulmonary exam normal       Cardiovascular negative cardio ROS  Rhythm:Regular Rate:Normal     Neuro/Psych negative neurological ROS  negative psych ROS   GI/Hepatic negative GI ROS, Neg liver ROS,   Endo/Other  negative endocrine ROS  Renal/GU negative Renal ROS  negative genitourinary   Musculoskeletal negative musculoskeletal ROS (+)   Abdominal   Peds negative pediatric ROS (+)  Hematology negative hematology ROS (+)   Anesthesia Other Findings   Reproductive/Obstetrics negative OB ROS                             Anesthesia Physical Anesthesia Plan  ASA: II  Anesthesia Plan: General   Post-op Pain Management:    Induction: Intravenous  Airway Management Planned: Oral ETT  Additional Equipment:   Intra-op Plan:   Post-operative Plan: Extubation in OR  Informed Consent: I have reviewed the patients History and Physical, chart, labs and discussed the procedure including the risks, benefits and alternatives for the proposed anesthesia with the patient or authorized representative who has indicated his/her understanding and acceptance.   Dental advisory given  Plan Discussed with: CRNA  Anesthesia Plan Comments:         Anesthesia Quick Evaluation

## 2015-01-31 NOTE — Discharge Instructions (Signed)
Walk As Tolerated utilizing back precautions.  No bending, twisting, or lifting.  No driving for 2 weeks.   °Aquacel dressing may remain in place until follow up. May shower with aquacel dressing in place. If the dressing peels off or becomes saturated, you may remove aquacel dressing and place gauze and tape dressing which should be kept clean and dry and changed daily. Do not remove steri-strips if they are present. °See Dr. Chrysta Fulcher in office in 10 to 14 days. Begin taking aspirin 81mg per day starting 4 days after your surgery if not allergic to aspirin or on another blood thinner. °Walk daily even outside. Use a cane or walker only if necessary. °Avoid sitting on soft sofas. ° °

## 2015-01-31 NOTE — Plan of Care (Signed)
Problem: Consults Goal: Diagnosis - Spinal Surgery lamincetomy L4-L5

## 2015-01-31 NOTE — Anesthesia Postprocedure Evaluation (Signed)
  Anesthesia Post-op Note  Patient: Arthur Barrett  Procedure(s) Performed: Procedure(s) (LRB): MICRO LUMBAR DECOMPRESSION LUMBAR FIVE TO SACRAL ONE ON LEFT (Left)  Patient Location: PACU  Anesthesia Type: General  Level of Consciousness: awake and alert   Airway and Oxygen Therapy: Patient Spontanous Breathing  Post-op Pain: mild  Post-op Assessment: Post-op Vital signs reviewed, Patient's Cardiovascular Status Stable, Respiratory Function Stable, Patent Airway and No signs of Nausea or vomiting  Last Vitals:  Filed Vitals:   01/31/15 1424  BP: 118/68  Pulse: 62  Temp: 36.5 C  Resp:     Post-op Vital Signs: stable   Complications: No apparent anesthesia complications

## 2015-01-31 NOTE — Anesthesia Procedure Notes (Signed)
Procedure Name: Intubation Date/Time: 01/31/2015 11:36 AM Performed by: Montel Clock Pre-anesthesia Checklist: Patient identified, Emergency Drugs available, Suction available and Patient being monitored Patient Re-evaluated:Patient Re-evaluated prior to inductionOxygen Delivery Method: Circle System Utilized Preoxygenation: Pre-oxygenation with 100% oxygen Intubation Type: IV induction Ventilation: Mask ventilation without difficulty Tube type: Oral Tube size: 7.5 mm Number of attempts: 1 Airway Equipment and Method: Stylet Placement Confirmation: ETT inserted through vocal cords under direct vision,  positive ETCO2 and breath sounds checked- equal and bilateral Secured at: 23 cm Tube secured with: Tape Dental Injury: Teeth and Oropharynx as per pre-operative assessment

## 2015-01-31 NOTE — Plan of Care (Signed)
Problem: Consults Goal: Diagnosis - Spinal Surgery Outcome: Completed/Met Date Met:  01/31/15 Microdiscectomy L5-S1

## 2015-02-01 ENCOUNTER — Encounter (HOSPITAL_COMMUNITY): Payer: Self-pay | Admitting: Specialist

## 2015-02-01 DIAGNOSIS — M5127 Other intervertebral disc displacement, lumbosacral region: Secondary | ICD-10-CM | POA: Diagnosis not present

## 2015-02-01 MED ORDER — MELOXICAM 7.5 MG PO TABS: 7.5000 mg | ORAL_TABLET | Freq: Every day | ORAL | Status: DC

## 2015-02-01 MED ORDER — ONDANSETRON HCL 4 MG PO TABS
4.0000 mg | ORAL_TABLET | Freq: Once | ORAL | Status: DC
  Administered 2015-02-01: 4 mg via ORAL
  Filled 2015-02-01: qty 1

## 2015-02-01 NOTE — Op Note (Signed)
NAMEOSIAH, HARING NO.:  [id number]  MEDICAL RECORD NO.:  [id number]  LOCATION:  2025                         FACILITY:  [facility]  PHYSICIAN:  Susa Day, M.D.    DATE OF BIRTH:  02-Dec-1955  DATE OF PROCEDURE:  01/31/2015 DATE OF DISCHARGE:                              OPERATIVE REPORT   PREOPERATIVE DIAGNOSIS:  Spinal stenosis, herniated nucleus pulposus, L5- S1, left.  POSTOPERATIVE DIAGNOSIS:  Spinal stenosis, herniated nucleus pulposus, L5-S1, left.  Extensive epidural venous plexus.  PROCEDURES PERFORMED: 1. Microlumbar decompression, L5-S1, left. 2. Foraminotomies, L5-S1. 3. Lysis of epidural venous plexus. 4. Microdiskectomy, L5-S1.  ANESTHESIA:  General.  SURGEON:  Susa Day, M.D.  ASSISTANT:  Cleophas Dunker, PA.  SPECIMEN:  L5-S1 disk to Pathology.  HISTORY:  A 59 year old with progressive L5-S1 radiculopathy secondary to extruded fragment, L5-S1, to the left.  Myotomal weakness, dermatomal dysesthesias, indicated for microlumbar decompression, L5-S1.  Risk and benefits discussed including bleeding, infection, damage to neurovascular structures, DVT, PE, anesthetic complications, etc.  TECHNIQUE:  With the patient in supine position, after induction of adequate general anesthesia, 2 g Kefzol, placed prone on the Bagley frame.  All bony prominences well padded.  Lumbar region was prepped and draped in usual sterile fashion.  An 18-gauge spinal needle was utilized to localize the L5-S1 interspace.  Incision was made from spinous process of L5-S1.  Subcutaneous tissue was carefully dissected. Electrocautery was utilized to achieve hemostasis.  Dorsolumbar fascia identified and divided in line with the skin incision.  Paraspinous muscle was gently elevated from lamina of L5 and S1.  He had an incomplete spinous process.  He had L5 spina bifida occulta.  Therefore, I identified digitally the lamina of L5 and of S1.  I placed  a McCullough retractor, confirmed by x-ray.  Operating microscope was draped, brought on the surgical field.  I detached the ligamentum flavum from the facet laterally and from the lamina of S1 distally with a straight curette.  Used a 2 mm Kerrison.  We removed the ligamentum flavum from the interspace.  Noted was compression of the S1 nerve root in the lateral recess.  There was an extensive epidural venous plexus, traversing the S1 nerve root and the L5 nerve root.  I performed a generous foraminotomy of S1.  I identified the S1 nerve root distally, gently mobilized it medially with D'Errico.  We identified the L5 nerve root proximally and the L5 nerve root as well as the vascular plexus. Came over the top of the L5 nerve root, performed foraminotomy of L5. There was some stenosis noted laterally.  Down distally, we clearly identified the epidural venous plexus.  This was cauterized and lysed, mobilizing the S1 nerve root.  Proximal to gain access to the disk.  We carefully placed the retractor up into the axilla of the L5 root, protecting the L5 root and the thecal sac at all times.  Small fragment was noted there, extending out from the disk space.  I performed an annulotomy.  Removed this small free fragment, and then entered the disc space with a straight pituitary with 2 additional fragments retrieved. I irrigated the disk space with antibiotic irrigation and retrieving the distal  small fragment.  I then checked beneath the thecal sac.  At the shoulder of the L5 root, beneath the L5 root out the foramen of S1, no residual disk herniation.  1 cm of excursion of the S1 nerve root was noted medial to be the pedicle without tension.  L5 and S1 nerve roots were intact.  No evidence of CSF leakage or active bleeding.  Copiously irrigated the wound.  The S1 nerve root was found to be erythematous and edematous.  He had decompressed lateral recess to the medial border of the pedicle.   Thrombin-soaked Gelfoam was placed in the laminotomy defect after confirmatory radiograph obtained.  We removed the [employer] retractor, irrigated the paraspinous musculature.  No evidence of active bleeding.  Dorsolumbar fascia reapproximated with 1 Vicryl, subcu with 2-0, and skin with Prolene.  Sterile dressing applied.  Placed supine on the hospital bed, extubated without difficulty, and transported to the recovery room in satisfactory condition.  The patient tolerated the procedure well.  No complications.  Assistant is Cleophas Dunker, [place].  Minimal blood loss.     Susa Day, M.D.     Geralynn Rile  D:  01/31/2015  T:  02/01/2015  Job:  [id number]

## 2015-02-01 NOTE — Evaluation (Signed)
Physical Therapy Evaluation Patient Details Name: Arthur Barrett MRN: [id number] DOB: 1956-07-21 Today's Date: 02/01/2015   History of Present Illness  s/p decompression L5-S1  Clinical Impression  Patient evaluated by Physical Therapy with no further acute PT needs identified. All education has been completed and the patient has no further questions.  See below for any follow-up Physial Therapy or equipment needs. PT is signing off. Thank you for this referral.     Follow Up Recommendations No PT follow up    Equipment Recommendations  None recommended by PT    Recommendations for Other Services       Precautions / Restrictions Precautions Precautions: Back Restrictions Weight Bearing Restrictions: No      Mobility  Bed Mobility Overal bed mobility: Needs Assistance Bed Mobility: Rolling;Sidelying to Sit Rolling: Supervision Sidelying to sit: Supervision       General bed mobility comments: cues for log roll technique  Transfers Overall transfer level: Needs assistance Equipment used: None Transfers: Sit to/from Stand Sit to Stand: Supervision;Modified independent (Device/Increase time)         General transfer comment: cues for posture  Ambulation/Gait Ambulation/Gait assistance: Supervision;Modified independent (Device/Increase time) Ambulation Distance (Feet): 350 Feet Assistive device: None Gait Pattern/deviations: Step-through pattern     General Gait Details: decr trunk rotation, decr UE swing; pt reports he has been walking with "a limp" for a long time; cues to avoid this and demo more equial step length and wt shift  Stairs Stairs: Yes Stairs assistance: Supervision Stair Management: One rail Right;Forwards;Step to pattern   General stair comments: cues for sequence, pt uses door frame at home for support  Wheelchair Mobility    Modified Rankin (Stroke Patients Only)       Balance                                             Pertinent Vitals/Pain Pain Assessment: No/denies pain    Home Living Family/patient expects to be discharged to:: Private residence Living Arrangements: Spouse/significant other Available Help at Discharge: Family Type of Home: House Home Access: Stairs to enter   Technical brewer of Steps: 2 Home Layout: One level Home Equipment: None      Prior Function Level of Independence: Independent               Hand Dominance        Extremity/Trunk Assessment   Upper Extremity Assessment: Defer to OT evaluation           Lower Extremity Assessment: LLE deficits/detail   LLE Deficits / Details: grossly WFL, pt reports RLE weakness, slight numbness  R lower leg as before surgery; discussed normalcy of this with pt and wife     Communication   Communication: No difficulties  Cognition Arousal/Alertness: Awake/alert Behavior During Therapy: WFL for tasks assessed/performed Overall Cognitive Status: Within Functional Limits for tasks assessed                      General Comments      Exercises        Assessment/Plan    PT Assessment Patent does not need any further PT services  PT Diagnosis Difficulty walking   PT Problem List    PT Treatment Interventions     PT Goals (Current goals can be found in the Care Plan section)  Frequency     Barriers to discharge        Co-evaluation               End of Session   Activity Tolerance: Patient tolerated treatment well Patient left: in chair;with call bell/phone within reach;with family/visitor present Nurse Communication: Mobility status    Functional Assessment Tool Used: clinical judgement Functional Limitation: Mobility: Walking and moving around Mobility: Walking and Moving Around Current Status (M0947): At least 1 percent but less than 20 percent impaired, limited or restricted Mobility: Walking and Moving Around Goal Status [phone]): 0 percent impaired,  limited or restricted Mobility: Walking and Moving Around Discharge Status [phone]): 0 percent impaired, limited or restricted    Time: 4765-4650 PT Time Calculation (min) (ACUTE ONLY): 17 min   Charges:   PT Evaluation $Initial PT Evaluation Tier I: 1 Procedure     PT G Codes:   PT G-Codes **NOT FOR INPATIENT CLASS** Functional Assessment Tool Used: clinical judgement Functional Limitation: Mobility: Walking and moving around Mobility: Walking and Moving Around Current Status (P5465): At least 1 percent but less than 20 percent impaired, limited or restricted Mobility: Walking and Moving Around Goal Status [phone]): 0 percent impaired, limited or restricted Mobility: Walking and Moving Around Discharge Status [phone]): 0 percent impaired, limited or restricted    [facility] 02/01/2015, 10:45 AM

## 2015-02-01 NOTE — Discharge Summary (Signed)
Physician Discharge Summary   Patient ID: Arthur Barrett MRN: [id number] DOB/AGE: 01/13/1956 59 y.o.  Admit date: 01/31/2015 Discharge date: 02/01/2015  Primary Diagnosis:   HNP L5-S1 left  Admission Diagnoses:  History reviewed. No pertinent past medical history. Discharge Diagnoses:   Active Problems:   Spinal stenosis at L4-L5 level  Procedure:  Procedure(s) (LRB): MICRO LUMBAR DECOMPRESSION LUMBAR FIVE TO SACRAL ONE ON LEFT (Left)   Consults: None  HPI:  see H&P    Laboratory Data: No results found for any previous visit. No results for input(s): HGB in the last 72 hours. No results for input(s): WBC, RBC, HCT, PLT in the last 72 hours. No results for input(s): NA, K, CL, CO2, BUN, CREATININE, GLUCOSE, CALCIUM in the last 72 hours. No results for input(s): LABPT, INR in the last 72 hours.  X-Rays:Dg Lumbar Spine 2-3 Views  01/26/2015   CLINICAL DATA:  59 year old male preoperative study for spine surgery. Initial encounter.  EXAM: LUMBAR SPINE - 2-3 VIEW  COMPARISON:  None.  FINDINGS: Normal lumbar segmentation, with small ribs at T12 and full size ribs at T11. Mild to moderate dextro convex lumbar scoliosis. Chronic wedging of the L3 vertebral body. Incidental spina bifida occulta at L5. No acute osseous abnormality identified.  IMPRESSION: No acute osseous abnormality identified in the lumbar spine. Normal lumbar segmentation. The lumbar levels on these images were numbered in anticipation of surgery.   Electronically Signed   By: Genevie Ann M.D.   On: 01/26/2015 10:15   Dg Spine Portable 1 View  01/31/2015   CLINICAL DATA:  59 year old male undergoing lumbar surgery. Initial encounter.  EXAM: PORTABLE SPINE - 1 VIEW  COMPARISON:  1200 hours today and earlier.  FINDINGS: Same numbering system as on the prior studies. Intraoperative portable cross-table lateral view of the lumbar spine at 1250 hours.  Surgical probe projects over the L5-S1 disc space.  IMPRESSION:  Intraoperative localization at L5-S1.   Electronically Signed   By: Genevie Ann M.D.   On: 01/31/2015 13:07   Dg Spine Portable 1 View  01/31/2015   CLINICAL DATA:  59 year old male undergoing lumbar surgery. Initial encounter.  EXAM: PORTABLE SPINE - 1 VIEW  COMPARISON:  1138 hours today and earlier.  FINDINGS: Same numbering system as on the prior studies, with normal lumbar segmentation noted on 01/26/2015.  Intraoperative portable cross-table lateral view of the lumbar spine 1200 hours.  Surgical probe projects at the L5 inferior articulating facet level, nearest the L5-S1 disc space.  IMPRESSION: Intraoperative localization at L5-S1.   Electronically Signed   By: Genevie Ann M.D.   On: 01/31/2015 12:16   Dg Spine Portable 1 View  01/31/2015   CLINICAL DATA:  L5-S1 lumbar spine surgery  EXAM: PORTABLE SPINE - 1 VIEW  COMPARISON:  Portable exam#1 at 1138 hours compared to 01/26/2015  FINDINGS: Prior exam labeled with 5 lumbar vertebra, current exam labeled similarly.  Metallic probe via dorsal approach projects dorsal to the spinous process of L5.  Chronic superior endplate height loss L3 vertebral body again noted.  IMPRESSION: Dorsal intraoperative localization of the level of the spinous process of L5.   Electronically Signed   By: Lavonia Dana M.D.   On: 01/31/2015 12:05    EKG:No orders found for this or any previous visit.   Hospital Course: Patient was admitted to [facility] and taken to the OR and underwent the above state procedure without complications.  Patient tolerated the procedure well and was later  transferred to the recovery room and then to the orthopaedic floor for postoperative care.  They were given PO and IV analgesics for pain control following their surgery.  They were given 24 hours of postoperative antibiotics.   PT was consulted postop to assist with mobility and transfers.  The patient was allowed to be WBAT with therapy and was taught back precautions. Discharge planning  was consulted to help with postop disposition and equipment needs.  Patient had a fair night on the evening of surgery and started to get up OOB with therapy on day one. Patient was seen in rounds and was ready to go home on day one.  They were given discharge instructions and dressing directions.  They were instructed on when to follow up in the office with Dr. Tonita Cong.   Diet: Regular diet Activity:WBAT; Lspine precautions Follow-up:in 10-14 days Disposition - Home Discharged Condition: good   Discharge Instructions    Call MD / Call 911    Complete by:  As directed   If you experience chest pain or shortness of breath, CALL 911 and be transported to the hospital emergency room.  If you develope a fever above 101 F, pus (white drainage) or increased drainage or redness at the wound, or calf pain, call your surgeon's office.     Constipation Prevention    Complete by:  As directed   Drink plenty of fluids.  Prune juice may be helpful.  You may use a stool softener, such as Colace (over the counter) 100 mg twice a day.  Use MiraLax (over the counter) for constipation as needed.     Diet - low sodium heart healthy    Complete by:  As directed      Increase activity slowly as tolerated    Complete by:  As directed             Medication List    TAKE these medications        cetirizine 10 MG tablet  Commonly known as:  ZYRTEC  Take 10 mg by mouth at bedtime.     diphenhydrAMINE 25 MG tablet  Commonly known as:  BENADRYL  Take 25 mg by mouth at bedtime.     docusate sodium 100 MG capsule  Commonly known as:  COLACE  Take 1 capsule (100 mg total) by mouth 2 (two) times daily as needed for mild constipation.     meloxicam 7.5 MG tablet  Commonly known as:  MOBIC  Take 1 tablet (7.5 mg total) by mouth daily. May resume 5 days post-op     methocarbamol 500 MG tablet  Commonly known as:  ROBAXIN  Take 1 tablet (500 mg total) by mouth every 8 (eight) hours as needed for muscle  spasms.     oxyCODONE-acetaminophen 5-325 MG per tablet  Commonly known as:  PERCOCET  Take 1 tablet by mouth every 4 (four) hours as needed.           Follow-up Information    Follow up with BEANE,JEFFREY C, MD In 2 weeks.   Specialty:  Orthopedic Surgery   Why:  For suture removal   Contact information:   [address] [place] [address] [phone]       Signed: Lacie Draft, [name] Orthopaedic Surgery 02/01/2015, 9:47 AM

## 2015-02-01 NOTE — Progress Notes (Signed)
Subjective: 1 Day Post-Op Procedure(s) (LRB): MICRO LUMBAR DECOMPRESSION LUMBAR FIVE TO SACRAL ONE ON LEFT (Left) Patient reports pain as mild. Reports incisional pain, did not sleep much overnight. Would like to go home today. Voiding without difficulty. Seen by myself and Dr. Tonita Cong  Objective: Vital signs in last 24 hours: Temp:  [97.5 F (36.4 C)-99.3 F (37.4 C)] 99.1 F (37.3 C) (04/14 0928) Pulse Rate:  [60-93] 74 (04/14 0928) Resp:  [13-18] 17 (04/14 0928) BP: (101-131)/(60-79) 107/67 mmHg (04/14 0928) SpO2:  [93 %-100 %] 93 % (04/14 0928) Weight:  [74.844 kg (165 lb)-75.07 kg (165 lb 8 oz)] 74.844 kg (165 lb) (04/13 1435)  Intake/Output from previous day: 04/13 0701 - 04/14 0700 In: 3566.3 [P.O.:960; I.V.:2506.3; IV Piggyback:100] Out: 1870 [Urine:1850; Blood:20] Intake/Output this shift: Total I/O In: 360 [P.O.:360] Out: 350 [Urine:350]  No results for input(s): HGB in the last 72 hours. No results for input(s): WBC, RBC, HCT, PLT in the last 72 hours. No results for input(s): NA, K, CL, CO2, BUN, CREATININE, GLUCOSE, CALCIUM in the last 72 hours. No results for input(s): LABPT, INR in the last 72 hours.  Neurologically intact ABD soft Neurovascular intact Sensation intact distally Intact pulses distally Dorsiflexion/Plantar flexion intact Incision: dressing C/D/I and no drainage No cellulitis present Compartment soft no calf pain or sign of DVT  Assessment/Plan: 1 Day Post-Op Procedure(s) (LRB): MICRO LUMBAR DECOMPRESSION LUMBAR FIVE TO SACRAL ONE ON LEFT (Left) Advance diet Up with therapy D/C IV fluids  Discussed D/C instructions, Lspine precautions, dressing instructions D/C home today Follow up in office in 2 weeks for suture removal  Glada Wickstrom M. 02/01/2015, 9:44 AM

## 2015-02-01 NOTE — Evaluation (Addendum)
Occupational Therapy One Time Evaluation Patient Details Name: Arthur Barrett MRN: [id number] DOB: May 15, 1956 Today's Date: 02/01/2015    History of Present Illness s/p decompression L5-S1   Clinical Impression   Pt doing well. All education completed and pt ok to d/c from OT standpoint. No DME needs.     Follow Up Recommendations  No OT follow up;Supervision - Intermittent    Equipment Recommendations  None recommended by OT    Recommendations for Other Services       Precautions / Restrictions Precautions Precautions: Back Restrictions Weight Bearing Restrictions: No      Mobility Bed Mobility            Transfers Overall transfer level: Needs assistance Equipment used: None Transfers: Sit to/from Stand Sit to Stand: Supervision         General transfer comment: cues for back precautions.    Balance                                            ADL Overall ADL's : Needs assistance/impaired Eating/Feeding: Independent;Sitting   Grooming: Wash/dry hands;Supervision/safety;Standing   Upper Body Bathing: Set up;Sitting   Lower Body Bathing: Minimal assistance;Sit to/from stand   Upper Body Dressing : Set up;Sitting   Lower Body Dressing: Moderate assistance;Sit to/from stand   Toilet Transfer: Supervision/safety;Comfort height toilet;Grab bars   Toileting- Clothing Manipulation and Hygiene: Supervision/safety;Sit to/from stand   Tub/ Shower Transfer: Walk-in Public librarian Details (indicate cue type and reason): side step over shower ledge   General ADL Comments: Pt able to state all back precautions but did need occassional cueing to apply precautions during ADL. Wife present for education. Pt states his commode is similar height to the one here and pt able to stand from comfort height with bar (has vanity on L side at home) and feels he can manage fine. Educated on where to obtain 3in1 if  needed once home. Pt not interested in AE for LB self care and wife assisted with donning pants, socks, shoes. pt doing well.      Vision     Perception     Praxis      Pertinent Vitals/Pain Pain Assessment: 0-10 Pain Score: 3  Pain Descriptors / Indicators: Sore Pain Intervention(s): Repositioned     Hand Dominance     Extremity/Trunk Assessment Upper Extremity Assessment Upper Extremity Assessment: Overall WFL for tasks assessed          Communication Communication Communication: No difficulties   Cognition Arousal/Alertness: Awake/alert Behavior During Therapy: WFL for tasks assessed/performed Overall Cognitive Status: Within Functional Limits for tasks assessed                     General Comments       Exercises       Shoulder Instructions      Home Living Family/patient expects to be discharged to:: Private residence Living Arrangements: Spouse/significant other Available Help at Discharge: Family Type of Home: House Home Access: Stairs to enter Technical brewer of Steps: 2   Home Layout: One level     Bathroom Shower/Tub: Occupational psychologist:  (comfort height)     Home Equipment: None          Prior Functioning/Environment Level of Independence: Independent             OT Diagnosis: Generalized  weakness   OT Problem List:     OT Treatment/Interventions:      OT Goals(Current goals can be found in the care plan section) Acute Rehab OT Goals Patient Stated Goal: home OT Goal Formulation: With patient/family  OT Frequency:     Barriers to D/C:            Co-evaluation              End of Session    Activity Tolerance: Patient tolerated treatment well Patient left: in chair;with call bell/phone within reach;with family/visitor present   Time: 1030-1053 OT Time Calculation (min): 23 min Charges:  OT General Charges $OT Visit: 1 Procedure OT Evaluation $Initial OT Evaluation Tier I: 1  Procedure OT Treatments $Therapeutic Activity: 8-22 mins G-Codes:    Jules Schick  [phone] 02/01/2015, 11:05 AM

## 2015-02-01 NOTE — Progress Notes (Signed)
   02/01/15 1104  OT G-codes **NOT FOR INPATIENT CLASS**  Functional Assessment Tool Used clinical judgement  Functional Limitation Self care  Self Care Current Status [phone]) CJ  Self Care Goal Status ([id number]) [facility]  Self Care Discharge Status [phone]) Ninetta Lights OTR/L [phone] 02/01/2015

## 2021-03-20 DIAGNOSIS — Z8616 Personal history of COVID-19: Secondary | ICD-10-CM

## 2021-03-20 HISTORY — DX: Personal history of COVID-19: Z86.16

## 2021-12-03 DIAGNOSIS — R1031 Right lower quadrant pain: Secondary | ICD-10-CM | POA: Diagnosis not present

## 2021-12-04 ENCOUNTER — Emergency Department (HOSPITAL_COMMUNITY): Payer: PPO

## 2021-12-04 ENCOUNTER — Encounter (HOSPITAL_COMMUNITY): Payer: Self-pay | Admitting: Emergency Medicine

## 2021-12-04 ENCOUNTER — Inpatient Hospital Stay (HOSPITAL_COMMUNITY)
Admission: EM | Admit: 2021-12-04 | Discharge: 2021-12-06 | DRG: 309 | Disposition: A | Discharge status: Home / Self Care | Payer: PPO | Attending: Internal Medicine | Admitting: Internal Medicine

## 2021-12-04 ENCOUNTER — Observation Stay (HOSPITAL_BASED_OUTPATIENT_CLINIC_OR_DEPARTMENT_OTHER): Payer: PPO

## 2021-12-04 ENCOUNTER — Other Ambulatory Visit: Payer: Self-pay

## 2021-12-04 DIAGNOSIS — I483 Typical atrial flutter: Secondary | ICD-10-CM | POA: Diagnosis not present

## 2021-12-04 DIAGNOSIS — Z7982 Long term (current) use of aspirin: Secondary | ICD-10-CM | POA: Diagnosis not present

## 2021-12-04 DIAGNOSIS — I480 Paroxysmal atrial fibrillation: Secondary | ICD-10-CM | POA: Diagnosis not present

## 2021-12-04 DIAGNOSIS — E871 Hypo-osmolality and hyponatremia: Secondary | ICD-10-CM | POA: Diagnosis not present

## 2021-12-04 DIAGNOSIS — Z9049 Acquired absence of other specified parts of digestive tract: Secondary | ICD-10-CM | POA: Diagnosis not present

## 2021-12-04 DIAGNOSIS — I700 Atherosclerosis of aorta: Secondary | ICD-10-CM | POA: Diagnosis present

## 2021-12-04 DIAGNOSIS — I4891 Unspecified atrial fibrillation: Secondary | ICD-10-CM | POA: Diagnosis present

## 2021-12-04 DIAGNOSIS — I77810 Thoracic aortic ectasia: Secondary | ICD-10-CM | POA: Diagnosis present

## 2021-12-04 DIAGNOSIS — Z9289 Personal history of other medical treatment: Secondary | ICD-10-CM

## 2021-12-04 DIAGNOSIS — I4892 Unspecified atrial flutter: Secondary | ICD-10-CM | POA: Diagnosis not present

## 2021-12-04 DIAGNOSIS — E785 Hyperlipidemia, unspecified: Secondary | ICD-10-CM | POA: Diagnosis not present

## 2021-12-04 DIAGNOSIS — Z87442 Personal history of urinary calculi: Secondary | ICD-10-CM | POA: Diagnosis not present

## 2021-12-04 DIAGNOSIS — M5137 Other intervertebral disc degeneration, lumbosacral region: Secondary | ICD-10-CM | POA: Diagnosis not present

## 2021-12-04 DIAGNOSIS — N433 Hydrocele, unspecified: Secondary | ICD-10-CM | POA: Diagnosis not present

## 2021-12-04 DIAGNOSIS — N132 Hydronephrosis with renal and ureteral calculous obstruction: Secondary | ICD-10-CM | POA: Diagnosis not present

## 2021-12-04 DIAGNOSIS — Z79899 Other long term (current) drug therapy: Secondary | ICD-10-CM | POA: Diagnosis not present

## 2021-12-04 DIAGNOSIS — R739 Hyperglycemia, unspecified: Secondary | ICD-10-CM | POA: Diagnosis present

## 2021-12-04 DIAGNOSIS — Z791 Long term (current) use of non-steroidal anti-inflammatories (NSAID): Secondary | ICD-10-CM | POA: Diagnosis not present

## 2021-12-04 DIAGNOSIS — N200 Calculus of kidney: Secondary | ICD-10-CM

## 2021-12-04 DIAGNOSIS — N201 Calculus of ureter: Secondary | ICD-10-CM | POA: Diagnosis present

## 2021-12-04 DIAGNOSIS — N289 Disorder of kidney and ureter, unspecified: Secondary | ICD-10-CM

## 2021-12-04 DIAGNOSIS — I081 Rheumatic disorders of both mitral and tricuspid valves: Secondary | ICD-10-CM | POA: Diagnosis not present

## 2021-12-04 HISTORY — DX: Unspecified atrial fibrillation: I48.91

## 2021-12-04 HISTORY — DX: Personal history of other medical treatment: Z92.89

## 2021-12-04 LAB — URINALYSIS, ROUTINE W REFLEX MICROSCOPIC
Bilirubin Urine: NEGATIVE
Glucose, UA: NEGATIVE mg/dL
Ketones, ur: NEGATIVE mg/dL
Leukocytes,Ua: NEGATIVE
Nitrite: NEGATIVE
Protein, ur: NEGATIVE mg/dL
Specific Gravity, Urine: 1.016 (ref 1.005–1.030)
pH: 5 (ref 5.0–8.0)

## 2021-12-04 LAB — CBC WITH DIFFERENTIAL/PLATELET
Abs Immature Granulocytes: 0.05 10*3/uL (ref 0.00–0.07)
Basophils Absolute: 0 10*3/uL (ref 0.0–0.1)
Basophils Relative: 0 %
Eosinophils Absolute: 0 10*3/uL (ref 0.0–0.5)
Eosinophils Relative: 0 %
HCT: 45.7 % (ref 39.0–52.0)
Hemoglobin: 16 g/dL (ref 13.0–17.0)
Immature Granulocytes: 1 %
Lymphocytes Relative: 14 %
Lymphs Abs: 1.3 10*3/uL (ref 0.7–4.0)
MCH: 32.3 pg (ref 26.0–34.0)
MCHC: 35 g/dL (ref 30.0–36.0)
MCV: 92.3 fL (ref 80.0–100.0)
Monocytes Absolute: 0.7 10*3/uL (ref 0.1–1.0)
Monocytes Relative: 8 %
Neutro Abs: 7 10*3/uL (ref 1.7–7.7)
Neutrophils Relative %: 77 %
Platelets: 197 10*3/uL (ref 150–400)
RBC: 4.95 MIL/uL (ref 4.22–5.81)
RDW: 12.6 % (ref 11.5–15.5)
WBC: 9 10*3/uL (ref 4.0–10.5)
nRBC: 0 % (ref 0.0–0.2)

## 2021-12-04 LAB — ECHOCARDIOGRAM COMPLETE
Area-P 1/2: 3.28 cm2
Calc EF: 49.7 %
S' Lateral: 3 cm
Single Plane A2C EF: 51.7 %
Single Plane A4C EF: 48 %

## 2021-12-04 LAB — COMPREHENSIVE METABOLIC PANEL
ALT: 25 U/L (ref 0–44)
AST: 26 U/L (ref 15–41)
Albumin: 3.8 g/dL (ref 3.5–5.0)
Alkaline Phosphatase: 46 U/L (ref 38–126)
Anion gap: 11 (ref 5–15)
BUN: 15 mg/dL (ref 8–23)
CO2: 23 mmol/L (ref 22–32)
Calcium: 8.6 mg/dL — ABNORMAL LOW (ref 8.9–10.3)
Chloride: 105 mmol/L (ref 98–111)
Creatinine, Ser: 1.41 mg/dL — ABNORMAL HIGH (ref 0.61–1.24)
GFR, Estimated: 55 mL/min — ABNORMAL LOW (ref >60)
Glucose, Bld: 138 mg/dL — ABNORMAL HIGH (ref 70–99)
Potassium: 3.5 mmol/L (ref 3.5–5.1)
Sodium: 139 mmol/L (ref 135–145)
Total Bilirubin: 0.8 mg/dL (ref 0.3–1.2)
Total Protein: 6.7 g/dL (ref 6.5–8.1)

## 2021-12-04 LAB — MAGNESIUM: Magnesium: 2 mg/dL (ref 1.7–2.4)

## 2021-12-04 LAB — HEMOGLOBIN A1C
Hgb A1c MFr Bld: 5.1 % (ref 4.8–5.6)
Mean Plasma Glucose: 99.67 mg/dL

## 2021-12-04 LAB — TSH: TSH: 1.355 u[IU]/mL (ref 0.350–4.500)

## 2021-12-04 MED ORDER — ACETAMINOPHEN 325 MG PO TABS
650.0000 mg | ORAL_TABLET | Freq: Four times a day (QID) | ORAL | Status: DC | PRN
  Administered 2021-12-05: 650 mg via ORAL
  Filled 2021-12-04: qty 2

## 2021-12-04 MED ORDER — SODIUM CHLORIDE 0.9 % IV BOLUS
1000.0000 mL | Freq: Once | INTRAVENOUS | Status: AC
  Administered 2021-12-04: 1000 mL via INTRAVENOUS

## 2021-12-04 MED ORDER — DILTIAZEM HCL-DEXTROSE 125-5 MG/125ML-% IV SOLN (PREMIX)
5.0000 mg/h | INTRAVENOUS | Status: DC
  Administered 2021-12-04: 7.5 mg/h via INTRAVENOUS
  Administered 2021-12-04: 5 mg/h via INTRAVENOUS
  Filled 2021-12-04 (×3): qty 125

## 2021-12-04 MED ORDER — MORPHINE SULFATE (PF) 2 MG/ML IV SOLN: 2.0000 mg | INTRAVENOUS | Status: DC | PRN

## 2021-12-04 MED ORDER — APIXABAN 5 MG PO TABS
5.0000 mg | ORAL_TABLET | Freq: Two times a day (BID) | ORAL | Status: DC
  Administered 2021-12-04 – 2021-12-06 (×5): 5 mg via ORAL
  Filled 2021-12-04 (×5): qty 1

## 2021-12-04 MED ORDER — ONDANSETRON HCL 4 MG PO TABS: 4.0000 mg | ORAL_TABLET | Freq: Four times a day (QID) | ORAL | Status: DC | PRN

## 2021-12-04 MED ORDER — ACETAMINOPHEN 650 MG RE SUPP: 650.0000 mg | Freq: Four times a day (QID) | RECTAL | Status: DC | PRN

## 2021-12-04 MED ORDER — DILTIAZEM LOAD VIA INFUSION
20.0000 mg | Freq: Once | INTRAVENOUS | Status: AC
  Administered 2021-12-04: 20 mg via INTRAVENOUS
  Filled 2021-12-04: qty 20

## 2021-12-04 MED ORDER — ONDANSETRON HCL 4 MG/2ML IJ SOLN: 4.0000 mg | Freq: Four times a day (QID) | INTRAMUSCULAR | Status: DC | PRN

## 2021-12-04 MED ORDER — LACTATED RINGERS IV SOLN: INTRAVENOUS | Status: DC

## 2021-12-04 MED ORDER — TAMSULOSIN HCL 0.4 MG PO CAPS
0.4000 mg | ORAL_CAPSULE | Freq: Every day | ORAL | Status: DC
  Administered 2021-12-04 – 2021-12-05 (×2): 0.4 mg via ORAL
  Filled 2021-12-04 (×2): qty 1

## 2021-12-04 MED ORDER — HYDRALAZINE HCL 20 MG/ML IJ SOLN: 5.0000 mg | INTRAMUSCULAR | Status: DC | PRN

## 2021-12-04 NOTE — H&P (Signed)
History and Physical    Patient: Arthur Barrett RUE:[id number] DOB: June 18, 1956 DOA: 12/04/2021 DOS: the patient was seen and examined on 12/04/2021 PCP: None Patient coming from: Home - lives with wife; NOK: Wife, [phone]   Chief Complaint: Abdominal pain  HPI: Arthur Barrett is a 67 y.o. male with no known medical history significant presenting with abdominal pain.  He reports pain that started in his testicle.  It went up into his R groin and today into his back.  Symptoms started night before last about 1230 and he took hydrocodone with improvement after a few hours.  He went to UC yesterday AM and was given a shot of Toradol and an rx for antibiotics and more pain medication.  It was better yesterday and awoke him again about 0230.  He finally came in and the pain eased off in the ER.  He hasn't had too much pain while in the ER.  He had been cutting wood Monday and so thought he might have pulled a muscle.  He has not had infectious symptoms.  No chest discomfort and has not noticed a fast heart rate.  However, for his bday he got a FitBit and it did report that his HR was a little high - but it didn't seem to go up with exertion but also didn't decrease at rest.  It was in the 130-140s all the time.  It buzzed twice during sleep, detected 10 minutes of HR >150.  No symptoms.      ER Course:  New-onset afib and kidney stone with pain control visits.  Urology recommends pain control and monitoring, call back if needed.  New afib, started on Eliquis, Dilt drip, HR 110.  Cardiology will consult.     Review of Systems: As mentioned in the history of present illness. All other systems reviewed and are negative. Past Medical History:  Diagnosis Date   Atrial fibrillation [facility])    Past Surgical History:  Procedure Laterality Date   APPENDECTOMY     colonscopy      LUMBAR LAMINECTOMY/DECOMPRESSION MICRODISCECTOMY Left 01/31/2015   Procedure: MICRO LUMBAR DECOMPRESSION LUMBAR FIVE  TO SACRAL ONE ON LEFT;  Surgeon: Susa Day, MD;  Location: [facility] OR[facility];  Service: Orthopedics;  Laterality: Left;   Social History:  reports that he has never smoked. He has never used smokeless tobacco. He reports that he does not drink alcohol and does not use drugs.  No Known Allergies  History reviewed. No pertinent family history.  Prior to Admission medications   Medication Sig Start Date End Date Taking? Authorizing Provider  cetirizine (ZYRTEC) 10 MG tablet Take 10 mg by mouth at bedtime.    [provider]  diphenhydrAMINE (BENADRYL) 25 MG tablet Take 25 mg by mouth at bedtime.    [provider]  docusate sodium (COLACE) 100 MG capsule Take 1 capsule (100 mg total) by mouth 2 (two) times daily as needed for mild constipation. 01/31/15   Susa Day, MD  meloxicam (MOBIC) 7.5 MG tablet Take 1 tablet (7.5 mg total) by mouth daily. May resume 5 days post-op 02/01/15   Lacie Draft M, [name]  methocarbamol (ROBAXIN) 500 MG tablet Take 1 tablet (500 mg total) by mouth every 8 (eight) hours as needed for muscle spasms. 01/31/15   Susa Day, MD  oxyCODONE-acetaminophen (PERCOCET) 5-325 MG per tablet Take 1 tablet by mouth every 4 (four) hours as needed. 01/31/15   Susa Day, MD    Physical Exam: Vitals:   12/04/21  1430 12/04/21 1530 12/04/21 1549 12/04/21 1559  BP: 135/84 (!) 130/94 (!) 131/99   Pulse: 72 73 74   Resp: 19 19 18    Temp:   98.7 F (37.1 C)   TempSrc:   Oral   SpO2: 95% 99% 97% 97%   General:  Appears calm and comfortable and is in NAD Eyes:  PERRL, EOMI, normal lids, iris ENT:  grossly normal hearing, lips & tongue, mmm Neck:  no LAD, masses or thyromegaly Cardiovascular:  Irregularly irregular with tachycardia in 110 range, +ectopy, no m/r/g. No LE edema.  Respiratory:   CTA bilaterally with no wheezes/rales/rhonchi.  Normal respiratory effort. Abdomen:  soft, NT, ND Back:   no CVAT Skin:  no rash or induration seen on limited  exam Musculoskeletal:  grossly normal tone BUE/BLE, good ROM, no bony abnormality Psychiatric:  grossly normal mood and affect, speech fluent and appropriate, AOx3 Neurologic:  CN 2-12 grossly intact, moves all extremities in coordinated fashion   Radiological Exams on Admission: Independently reviewed - see discussion in A/P where applicable  CT Renal Stone Study  Result Date: 12/04/2021 CLINICAL DATA:  Flank pain.  Rule out kidney stone. EXAM: CT ABDOMEN AND PELVIS WITHOUT CONTRAST TECHNIQUE: Multidetector CT imaging of the abdomen and pelvis was performed following the standard protocol without IV contrast. RADIATION DOSE REDUCTION: This exam was performed according to the departmental dose-optimization program which includes automated exposure control, adjustment of the mA and/or kV according to patient size and/or use of iterative reconstruction technique. COMPARISON:  None. FINDINGS: Lower chest: No acute abnormality. Hepatobiliary: No focal liver abnormality.  Gallbladder normal. Pancreas: Unremarkable. No pancreatic ductal dilatation or surrounding inflammatory changes. Spleen: Normal in size without focal abnormality. Adrenals/Urinary Tract: Normal adrenal glands. Upper pole right renal calculus measures 4 mm. No left renal calculi. There is right-sided perinephric fat stranding and hydronephrosis with hydroureter. Within the proximal right ureter there is a stone measuring 4 mm, image 78/6 and image 42/3. No left-sided hydronephrosis or hydroureter. Urinary bladder is unremarkable. Stomach/Bowel: Stomach is within normal limits. Status post appendectomy. No evidence of bowel wall thickening, distention, or inflammatory changes. Vascular/Lymphatic: Mild aortic atherosclerosis. No aneurysm. No abdominopelvic adenopathy. Reproductive: Prostate is unremarkable. Partially visualized small left hydrocele. Other: No free fluid or fluid collections identified. Musculoskeletal: Degenerative disc disease  is noted at L5-S1. Chronic superior endplate deformity involving the L3 vertebral body is unchanged from 2016. IMPRESSION: 1. Right-sided hydronephrosis and hydroureter secondary to 4 mm proximal right ureteral calculus. 2. Aortic Atherosclerosis (ICD10-I70.0). Electronically Signed   By: Kerby Moors M.D.   On: 12/04/2021 07:21    EKG: Independently reviewed.  Atrial flutter with rate 136; no evidence of acute ischemia   Labs on Admission: I have personally reviewed the available labs and imaging studies at the time of the admission.  Pertinent labs:    Glucose 138 BUN 15/Creatinine 1.41/GFR 55 Normal CBC TSH 1.355 UA: moderate Hgb, rare bacteria    Assessment and Plan: * New onset atrial fibrillation ([place])- (present on admission) -Patient presenting with new-onset afib.  -He has been wearing a FitBit since last weekend and has continuous HR 130-150 regardless of exertion or rest, without symptoms, so he thought it was a glitch  -Etiology is unknown; he hasn't seen a doctor in years so it is not clear how long this has been present.  -Will plan to place in observation status in SDU for Diltiazem drip as per protocol with plan to transition to PO Diltiazem once  heart rate is controlled (resting HR 110 or lower). He is having ectopy and so may be close to spontaneously converting.  If he does not convert, cardioversion could be considered once he is adequately anticoagulated. -Will request Echocardiogram for further evaluation  -Will risk stratify with FLP and HgbA1c; will also order UDS.  -Will consult cardiology  -CHA2DS2-VASc Score is 1 and so patient would benefit from at least short-term oral anticoagulation. There is evidence of net benefit even in the extremely elderly population. -Eliquis was started in the ER.  Ureteral calculus, right- (present on admission) -Patient presented with R renal colic and was found to have a 67mm proximal R ureteral calculus -Will hydrate with LR  at 100 cc/hr until patient passes stone -Will start Flomax 0.4 mg PO qhs -Urology was consulted by the EDP and supportive care was recommended -If not improving or if worsening, reconsult urology; otherwise this stone is anticipated to pass spontaneously  Renal dysfunction -He has not seen a doctor in years and does not have recent labs -Current creatinine is 1.41 with GFR 55 -This may be related to AKI in the setting of renal colic but could also be CKD -Hydration as noted -Recheck BMP in AM -Needs PCP, will place [facility] team consult  Hyperglycemia without h/o DM -Check A1c -Will follow without intervention for now, may be stress-induced    Advance Care Planning:   Code Status: Full Code   Consults: Urology (telephone only); Cardiology; [facility] team  DVT Prophylaxis: Eliquis  Family Communication: Wife was present throughout evaluation  Severity of Illness: The appropriate patient status for this patient is OBSERVATION. Observation status is judged to be reasonable and necessary in order to provide the required intensity of service to ensure the patient's safety. The patient's presenting symptoms, physical exam findings, and initial radiographic and laboratory data in the context of their medical condition is felt to place them at decreased risk for further clinical deterioration. Furthermore, it is anticipated that the patient will be medically stable for discharge from the hospital within 2 midnights of admission.   Author: Karmen Bongo, MD 12/04/2021 6:16 PM  For on call review [email or web address].

## 2021-12-04 NOTE — ED Provider Triage Note (Signed)
Emergency Medicine Provider Triage Evaluation Note  Arthur Barrett , a 66 y.o. male  was evaluated in triage.  Pt complains of abdominal pain over the past 24 hours. Pain to right lower abdomen.  Waxing/waning in severity. Seen @ UC for similar yesterday and got IM toradol which helped his pain, received prescriptions for analgesics, took this before bed tonight but woke up around 2AM with worsening pain that is now occurring in the right testicle and radiating into the right flank. Has had associated N/V. He has not urinated since waking up but has not had the urge to go. He reports there was some blood in his urine @ UC yesterday. Pain has significantly eased off since arrival to the ED.   Review of Systems  Positive: Abdominal pain, flank pain, testicle pain, N/V Negative: Fever, chills, dysuria.   Physical Exam  BP 110/82 (BP Location: Left Arm)    Pulse (!) 119    Temp 98.9 F (37.2 C)    Resp (!) 22    SpO2 95%  Gen:   Awake, no distress   Resp:  Normal effort  MSK:   Moves extremities without difficulty  Other:  No peritoneal signs on abdominal exam, GU exam w/ chaperone, no testicular tenderness or obvious torsion.   Medical Decision Making  Medically screening exam initiated at 6:07 AM.  Appropriate orders placed.  Arthur Barrett was informed that the remainder of the evaluation will be completed by another provider, this initial triage assessment does not replace that evaluation, and the importance of remaining in the ED until their evaluation is complete.  Initially checked in with "unable to urinate" but patient states he has not urinated since waking up but also does not have the urge to do so.   Plan for labs & CT renal stone study.     Arthur Barrett, [place] 12/04/21 [phone]

## 2021-12-04 NOTE — ED Provider Notes (Signed)
[facility] EMERGENCY DEPARTMENT Provider Note   CSN: [id number] Arrival date & time: 12/04/21  0524     History  Chief Complaint  Patient presents with   Abdominal Pain    Arthur Barrett is a 66 y.o. male.  HPI Patient presents with his wife who assists with the history.  He presents with concern for abdominal pain, right-sided, severe with associated nausea.  Onset of illness was 4 days ago, since that time he has been intermittently incapacitated by pain.  He has been seen and evaluated by urgent care yesterday, discharged after receiving IM analgesics.  Today he presents due to severe recurrence overnight.  He denies chest pain or dyspnea, fever. He notes that he has not seen a physician in possibly 6 years, but takes aspirin daily.  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 Authorizing Provider  cetirizine (ZYRTEC) 10 MG tablet Take 10 mg by mouth at bedtime.    [provider]  diphenhydrAMINE (BENADRYL) 25 MG tablet Take 25 mg by mouth at bedtime.    [provider]  docusate sodium (COLACE) 100 MG capsule Take 1 capsule (100 mg total) by mouth 2 (two) times daily as needed for mild constipation. 01/31/15   Susa Day, MD  meloxicam (MOBIC) 7.5 MG tablet Take 1 tablet (7.5 mg total) by mouth daily. May resume 5 days post-op 02/01/15   Lacie Draft M, [name]  methocarbamol (ROBAXIN) 500 MG tablet Take 1 tablet (500 mg total) by mouth every 8 (eight) hours as needed for muscle spasms. 01/31/15   Susa Day, MD  oxyCODONE-acetaminophen (PERCOCET) 5-325 MG per tablet Take 1 tablet by mouth every 4 (four) hours as needed. 01/31/15   Susa Day, MD      Allergies    Patient has no known allergies.    Review of Systems   Review of Systems  Constitutional:        Per HPI, otherwise negative  HENT:         Per HPI, otherwise negative  Respiratory:          Per HPI, otherwise negative  Cardiovascular:        Per HPI, otherwise negative  Gastrointestinal:  Positive for nausea. Negative for vomiting.  Endocrine:       Negative aside from HPI  Genitourinary:        Neg aside from HPI   Musculoskeletal:        Per HPI, otherwise negative  Skin: Negative.   Neurological:  Negative for syncope.  All other systems reviewed and are negative.  Physical Exam Updated Vital Signs BP 103/74    Pulse (!) 143    Temp 98.9 F (37.2 C)    Resp 19    SpO2 100%  Physical Exam Vitals and nursing note reviewed.  Constitutional:      General: He is not in acute distress.    Appearance: He is well-developed.  HENT:     Head: Normocephalic and atraumatic.  Eyes:     Conjunctiva/sclera: Conjunctivae normal.  Cardiovascular:     Rate and Rhythm: Regular rhythm. Tachycardia present.  Pulmonary:     Effort: Pulmonary effort is normal. No respiratory distress.     Breath sounds: No stridor.  Abdominal:     General: There is no distension.     Tenderness: There is abdominal tenderness in the right lower quadrant.  Skin:  General: Skin is warm and dry.  Neurological:     Mental Status: He is alert and oriented to person, place, and time.    ED Results / Procedures / Treatments   Labs (all labs ordered are listed, but only abnormal results are displayed) Labs Reviewed  COMPREHENSIVE METABOLIC PANEL - Abnormal; Notable for the following components:      Result Value   Glucose, Bld 138 (*)    Creatinine, Ser 1.41 (*)    Calcium 8.6 (*)    GFR, Estimated 55 (*)    All other components within normal limits  CBC WITH DIFFERENTIAL/PLATELET  URINALYSIS, ROUTINE W REFLEX MICROSCOPIC  MAGNESIUM  TSH    EKG EKG Interpretation  Date/Time:  Wednesday December 04 2021 08:42:27 EST Ventricular Rate:  136 PR Interval:    QRS Duration: 104 QT Interval:  317 QTC Calculation: 488 R Axis:   13 Text Interpretation: Atrial flutter RSR' in V1 or  V2, right VCD or RVH Artifact Abnormal ECG Confirmed by Carmin Muskrat [phone]) on 12/04/2021 9:17:44 AM  Radiology CT Renal Stone Study  Result Date: 12/04/2021 CLINICAL DATA:  Flank pain.  Rule out kidney stone. EXAM: CT ABDOMEN AND PELVIS WITHOUT CONTRAST TECHNIQUE: Multidetector CT imaging of the abdomen and pelvis was performed following the standard protocol without IV contrast. RADIATION DOSE REDUCTION: This exam was performed according to the departmental dose-optimization program which includes automated exposure control, adjustment of the mA and/or kV according to patient size and/or use of iterative reconstruction technique. COMPARISON:  None. FINDINGS: Lower chest: No acute abnormality. Hepatobiliary: No focal liver abnormality.  Gallbladder normal. Pancreas: Unremarkable. No pancreatic ductal dilatation or surrounding inflammatory changes. Spleen: Normal in size without focal abnormality. Adrenals/Urinary Tract: Normal adrenal glands. Upper pole right renal calculus measures 4 mm. No left renal calculi. There is right-sided perinephric fat stranding and hydronephrosis with hydroureter. Within the proximal right ureter there is a stone measuring 4 mm, image 78/6 and image 42/3. No left-sided hydronephrosis or hydroureter. Urinary bladder is unremarkable. Stomach/Bowel: Stomach is within normal limits. Status post appendectomy. No evidence of bowel wall thickening, distention, or inflammatory changes. Vascular/Lymphatic: Mild aortic atherosclerosis. No aneurysm. No abdominopelvic adenopathy. Reproductive: Prostate is unremarkable. Partially visualized small left hydrocele. Other: No free fluid or fluid collections identified. Musculoskeletal: Degenerative disc disease is noted at L5-S1. Chronic superior endplate deformity involving the L3 vertebral body is unchanged from 2016. IMPRESSION: 1. Right-sided hydronephrosis and hydroureter secondary to 4 mm proximal right ureteral calculus. 2. Aortic  Atherosclerosis (ICD10-I70.0). Electronically Signed   By: Kerby Moors M.D.   On: 12/04/2021 07:21    Procedures Procedures    Medications Ordered in ED Medications  diltiazem (CARDIZEM) 1 mg/mL load via infusion 20 mg (20 mg Intravenous Bolus from Bag 12/04/21 0857)    And  diltiazem (CARDIZEM) 125 mg in dextrose 5% 125 mL (1 mg/mL) infusion (5 mg/hr Intravenous New Bag/Given 12/04/21 0856)  apixaban (ELIQUIS) tablet 5 mg (has no administration in time range)  sodium chloride 0.9 % bolus 1,000 mL (1,000 mLs Intravenous New Bag/Given 12/04/21 0850)    ED Course/ Medical Decision Making/ A&P  This patient presents to the ED for concern of abdominal pain, nausea, this involves an extensive number of treatment options, and is a complaint that carries with it a high risk of complications and morbidity.  The differential diagnosis includes kidney stone, appendicitis, diverticulitis, hepatobiliary dysfunction   Co morbidities that complicate the patient evaluation  Age   Social Determinants of  Health:  Age   Additional history obtained:  Additional history and/or information obtained from wife chart review External records from outside source obtained and reviewed including microdiscectomy 6 years ago on EMR   After the initial evaluation, orders, including: Additional labs initial orders were from triage were initiated.  Patient found to have evidence for atrial fibrillation with rapid ventricular response versus atrial flutter on my initial evaluation, was placed on continuous cardiac monitoring, pulse oximetry as below.  Patient placed on Cardiac and Pulse-Oximetry Monitors. The patient was maintained on a cardiac monitor.  The cardiac monitored showed an rhythm of a flutter, 150 The patient was also maintained on pulse oximetry. The readings were typically 99% room air  9:19 AM I discussed the patient's case with our urology colleagues after I interpreted the CT scan myself.   With consideration of new a flutter patient will start anticoagulant, rate control with Cardizem bolus, drip have been initiated On repeat evaluation of the patient improved  Lab Tests:  I personally interpreted labs.  The pertinent results include: Urinalysis consistent with hemoglobinuria, consistent with stones  Imaging Studies ordered:  I independently visualized and interpreted imaging which showed right ureteral 4 mm stone I agree with the radiologist interpretation  Consultations Obtained:  I requested consultation with the urology and cardiology teams,  and discussed lab and imaging findings as well as pertinent plan - they recommend: Initiation of anticoagulation given the patient's new A-fib, urology can be consulted as needed for stents or other interventions should the patient's pain not be controlled, prior to discharge.  Cardiology will consult on the patient  Dispostion / Final MDM:  After consideration of the diagnostic results and the patient's response to treatment, he will require admission.  Initially patient was in atrial fibs/a flutter, though is presenting with abdominal pain, and found to have a kidney stone.  Patient also found to have dehydration, mildly.  Patient required initiation of diltiazem bolus, drip, substantial reduction in heart rate, though not with conversion to sinus rhythm.  Patient received anticoagulant initiation here by myself.  Given the commendation of new A-fib with kidney stone requiring ongoing analgesics, patient required admission, after conversation with our internal medicine colleague..  Final Clinical Impression(s) / ED Diagnoses Final diagnoses:  Atrial fibrillation, new onset ([place])  Nephrolithiasis   CRITICAL CARE Performed by: Carmin Muskrat Total critical care time: 35 minutes Critical care time was exclusive of separately billable procedures and treating other patients. Critical care was necessary to treat or prevent imminent  or life-threatening deterioration. Critical care was time spent personally by me on the following activities: development of treatment plan with patient and/or surrogate as well as nursing, discussions with consultants, evaluation of patient's response to treatment, examination of patient, obtaining history from patient or surrogate, ordering and performing treatments and interventions, ordering and review of laboratory studies, ordering and review of radiographic studies, pulse oximetry and re-evaluation of patient's condition.    Carmin Muskrat, MD 12/04/21 1515

## 2021-12-04 NOTE — Consult Note (Signed)
Cardiology Consultation:   Patient ID: Arthur Barrett MRN: [id number]; DOB: 08/14/1956  Admit date: 12/04/2021 Date of Consult: 12/04/2021  PCP:  Shon Baton, MD   [facility] Providers Cardiologist:  None        Patient Profile:   Arthur Barrett is a 66 y.o. male with a hx no reported medical history who is being seen 12/04/2021 for the evaluation of atrial fibrillation at the request of Dr Karmen Bongo.  History of Present Illness:   [name] with no reported medical history presented to the emergency department to be evaluated for abdominal pain.  On presentation to the ED he was noted to be in atrial fibrillation with rapid ventricular rate.  He was started on Eliquis as well as Cardizem drip with cardiology consultation.  I did see the patient by his bedside in the emergency department.  He denied any history of cardiovascular conditions.  He tells me that about 30 years ago he did see a cardiologist at that time he thinks that he had a stress test but was told this was all normal and did not need to follow up again.  He has not had any issues.  He has been pretty healthy he tells me.  His wife is at the bedside.  He denies any chest pain or shortness of breath.  He notes that his abdominal pain is what brought him to the emergency department.  He describes his abdominal pain and right-sided severe pain that is associated with nausea.  He notes that this started about 4 days ago he was seen at the urgent care he was discharged home after he received IM education.  He notes that the pain came back and got significantly worse therefore he wanted to be evaluated.  Of note he has not seen any physician for over 5 years he tells me.  History reviewed. No pertinent past medical history.  Past Surgical History:  Procedure Laterality Date   APPENDECTOMY     colonscopy      LUMBAR LAMINECTOMY/DECOMPRESSION MICRODISCECTOMY Left 01/31/2015   Procedure: MICRO LUMBAR  DECOMPRESSION LUMBAR FIVE TO SACRAL ONE ON LEFT;  Surgeon: Susa Day, MD;  Location: [facility];  Service: Orthopedics;  Laterality: Left;     Home Medications:  Prior to Admission medications   Medication Sig Start Date End Date Taking? Authorizing Provider  cetirizine (ZYRTEC) 10 MG tablet Take 10 mg by mouth at bedtime.    [provider]  diphenhydrAMINE (BENADRYL) 25 MG tablet Take 25 mg by mouth at bedtime.    [provider]  docusate sodium (COLACE) 100 MG capsule Take 1 capsule (100 mg total) by mouth 2 (two) times daily as needed for mild constipation. 01/31/15   Susa Day, MD  meloxicam (MOBIC) 7.5 MG tablet Take 1 tablet (7.5 mg total) by mouth daily. May resume 5 days post-op 02/01/15   Lacie Draft M, [name]  methocarbamol (ROBAXIN) 500 MG tablet Take 1 tablet (500 mg total) by mouth every 8 (eight) hours as needed for muscle spasms. 01/31/15   Susa Day, MD  oxyCODONE-acetaminophen (PERCOCET) 5-325 MG per tablet Take 1 tablet by mouth every 4 (four) hours as needed. 01/31/15   Susa Day, MD    Inpatient Medications: Scheduled Meds:  apixaban  5 mg Oral BID   Continuous Infusions:  diltiazem (CARDIZEM) infusion 5 mg/hr (12/04/21 0856)   PRN Meds:   Allergies:   No Known Allergies  Social History:   Social History   Socioeconomic History  Marital status: Married    Spouse name: Not on file   Number of children: Not on file   Years of education: Not on file   Highest education level: Not on file  Occupational History   Not on file  Tobacco Use   Smoking status: Never   Smokeless tobacco: Never  Substance and Sexual Activity   Alcohol use: No   Drug use: No   Sexual activity: Not on file  Other Topics Concern   Not on file  Social History Narrative   Not on file   Social Determinants of Health   Financial Resource Strain: Not on file  Food Insecurity: Not on file  Transportation Needs: Not on file  Physical Activity: Not  on file  Stress: Not on file  Social Connections: Not on file  Intimate Partner Violence: Not on file    Family History:   History reviewed. No pertinent family history.   ROS:  Review of Systems  Constitution: Negative for decreased appetite, fever and weight gain.  HENT: Negative for congestion, ear discharge, hoarse voice and sore throat.   Eyes: Negative for discharge, redness, vision loss in right eye and visual halos.  Cardiovascular: Negative for chest pain, dyspnea on exertion, leg swelling, orthopnea and palpitations.  Respiratory: Negative for cough, hemoptysis, shortness of breath and snoring.   Endocrine: Negative for heat intolerance and polyphagia.  Hematologic/Lymphatic: Negative for bleeding problem. Does not bruise/bleed easily.  Skin: Negative for flushing, nail changes, rash and suspicious lesions.  Musculoskeletal: Negative for arthritis, joint pain, muscle cramps, myalgias, neck pain and stiffness.  Gastrointestinal: Reports abdominal pain.  Negative for bowel incontinence, diarrhea and excessive appetite.  Genitourinary: Negative for decreased libido, genital sores and incomplete emptying.  Neurological: Negative for brief paralysis, focal weakness, headaches and loss of balance.  Psychiatric/Behavioral: Negative for altered mental status, depression and suicidal ideas.  Allergic/Immunologic: Negative for HIV exposure and persistent infections.    Physical Exam/Data:   Vitals:   12/04/21 1230 12/04/21 1330 12/04/21 1400 12/04/21 1430  BP: 120/82 112/84 121/90 135/84  Pulse: (!) 130 (!) 41 70 72  Resp: 17 18 18 19   Temp:      SpO2: 95% 93% 96% 95%    Intake/Output Summary (Last 24 hours) at 12/04/2021 1448 Last data filed at 12/04/2021 1106 Gross per 24 hour  Intake 999 ml  Output --  Net 999 ml   Last 3 Weights 01/31/2015 01/31/2015 01/26/2015  Weight (lbs) 165 lb 165 lb 8 oz 165 lb 8 oz  Weight (kg) 74.844 kg 75.07 kg 75.07 kg     There is no height or  weight on file to calculate BMI.  General:  Well nourished, well developed, in no acute distress HEENT: normal Neck: no JVD Vascular: No carotid bruits; Distal pulses 2+ bilaterally Cardiac:  normal S1, S2; RRR; no murmur  Lungs:  clear to auscultation bilaterally, no wheezing, rhonchi or rales  Abd: soft, nontender, no hepatomegaly  Ext: no edema Musculoskeletal:  No deformities, BUE and BLE strength normal and equal Skin: warm and dry  Neuro:  CNs 2-12 intact, no focal abnormalities noted Psych:  Normal affect   EKG:  The EKG was personally reviewed and demonstrates:  atrial flutter with 2:1 conduction Telemetry:  Telemetry was personally reviewed and demonstrates:  with atrial flutter  Relevant CV Studies: None  Laboratory Data:  High Sensitivity Troponin:  No results for input(s): TROPONINIHS in the last 720 hours.   Chemistry Recent Labs  Lab 12/04/21 0552 12/04/21 0851  NA 139  --   K 3.5  --   CL 105  --   CO2 23  --   GLUCOSE 138*  --   BUN 15  --   CREATININE 1.41*  --   CALCIUM 8.6*  --   MG  --  2.0  GFRNONAA 55*  --   ANIONGAP 11  --     Recent Labs  Lab 12/04/21 0552  PROT 6.7  ALBUMIN 3.8  AST 26  ALT 25  ALKPHOS 46  BILITOT 0.8   Lipids No results for input(s): CHOL, TRIG, HDL, LABVLDL, LDLCALC, CHOLHDL in the last 168 hours.  Hematology Recent Labs  Lab 12/04/21 0552  WBC 9.0  RBC 4.95  HGB 16.0  HCT 45.7  MCV 92.3  MCH 32.3  MCHC 35.0  RDW 12.6  PLT 197   Thyroid  Recent Labs  Lab 12/04/21 0851  TSH 1.355    BNPNo results for input(s): BNP, PROBNP in the last 168 hours.  DDimer No results for input(s): DDIMER in the last 168 hours.   Radiology/Studies:  CT Renal Stone Study  Result Date: 12/04/2021 CLINICAL DATA:  Flank pain.  Rule out kidney stone. EXAM: CT ABDOMEN AND PELVIS WITHOUT CONTRAST TECHNIQUE: Multidetector CT imaging of the abdomen and pelvis was performed following the standard protocol without IV contrast.  RADIATION DOSE REDUCTION: This exam was performed according to the departmental dose-optimization program which includes automated exposure control, adjustment of the mA and/or kV according to patient size and/or use of iterative reconstruction technique. COMPARISON:  None. FINDINGS: Lower chest: No acute abnormality. Hepatobiliary: No focal liver abnormality.  Gallbladder normal. Pancreas: Unremarkable. No pancreatic ductal dilatation or surrounding inflammatory changes. Spleen: Normal in size without focal abnormality. Adrenals/Urinary Tract: Normal adrenal glands. Upper pole right renal calculus measures 4 mm. No left renal calculi. There is right-sided perinephric fat stranding and hydronephrosis with hydroureter. Within the proximal right ureter there is a stone measuring 4 mm, image 78/6 and image 42/3. No left-sided hydronephrosis or hydroureter. Urinary bladder is unremarkable. Stomach/Bowel: Stomach is within normal limits. Status post appendectomy. No evidence of bowel wall thickening, distention, or inflammatory changes. Vascular/Lymphatic: Mild aortic atherosclerosis. No aneurysm. No abdominopelvic adenopathy. Reproductive: Prostate is unremarkable. Partially visualized small left hydrocele. Other: No free fluid or fluid collections identified. Musculoskeletal: Degenerative disc disease is noted at L5-S1. Chronic superior endplate deformity involving the L3 vertebral body is unchanged from 2016. IMPRESSION: 1. Right-sided hydronephrosis and hydroureter secondary to 4 mm proximal right ureteral calculus. 2. Aortic Atherosclerosis (ICD10-I70.0). Electronically Signed   By: Kerby Moors M.D.   On: 12/04/2021 07:21     Assessment and Plan:   Atrial flutter with rapid ventricular rate -he is currently on Cardizem drip and continues to be in rapid ventricular rate.  I am going to add Lopressor 25 mg twice daily.  His CHA2DS2-VASc score is 1, he has been started on anticoagulation with a CHA2DS2-VASc  score of 1 anticoagulation is not indicated however we will keep the patient on the Eliquis because if he does not convert spontaneously he will be a great candidate for TEE/cardioversion.  I have talked to the patient and his wife about this they are in agreement with this plan.  For now we will get an echocardiogram to assess LV function.  In the meantime we will have to wait to see what needs to be done with his kidney stone before pursuing TEE cardioversion as  if he gets cardioverted he is committed to 4 weeks of anticoagulation.  So we will continue with rate control agents for now.  Right-sided hydronephrosis and hydroureter right ureteral calculus-management will be deferred to the primary team as well as the urology team. Elevated creatine: AKI vs Chronic Kidney disease-2016 his creatinine seems to have been slightly elevated suggesting chronic kidney disease at the time his GFR was 61 and creatinine 1.26.  This could be likely acute on chronic AKI in the setting of his kidney stones or just progression of coronary disease.  Will defer to the primary team and urology.  Hyponatremia-we will follow repeat labs.   Risk Assessment/Risk Scores:          CHA2DS2-VASc Score = 1   This indicates a 0.6% annual risk of stroke. The patient's score is based upon: CHF History: 0 HTN History: 0 Diabetes History: 0 Stroke History: 0 Vascular Disease History: 0 Age Score: 1 Gender Score: 0        For questions or updates, please contact [place] Please consult [email or web address] for contact info under    SignedBerniece Salines, DO  12/04/2021 2:48 PM

## 2021-12-04 NOTE — Assessment & Plan Note (Addendum)
Patient presented with R renal colic and was found to have a 55mm proximal R ureteral calculus.  ED physician consulted urology with recommendations of supportive care, IV fluid hydration as renal stone small only 4 mm with likely to pass spontaneously.  Started on tamsulosin 0.4 mg p.o. daily.  Outpatient follow-up with urology in 2-3 weeks.

## 2021-12-04 NOTE — Assessment & Plan Note (Addendum)
Patient has not seen a physician in many years and no recent labs to establish baseline.  On admission, creatinine up to 1.41 with a GFR of 55.  Patient was started on IV fluid hydration with improvement of creatinine to 1.10 at time of discharge.  Recommend repeat BMP 1 week to ensure creatinine remains stable.

## 2021-12-04 NOTE — Assessment & Plan Note (Addendum)
Patient presenting to ED and was found to be in new onset A-fib with RVR.  Recently bought a Fitbit and has had continuous heart rates in the 130s-150s regardless of exercise/rest.  Has been asymptomatic.  Has not seen a physician in multiple years.  TSH within normal limits.  UDS negative.  TTE with LVEF 40-45%, LV mild decreased function, no regional wall motion abnormalities LV, mild LVH, biatrial enlargement, trivial MR, IVC normal in size, aortic ascending dilatation 40 mm.  CHA2DS2-VASc score = 1.  Cardiology was consulted and followed during hospital course.  Patient was initially started on a Cardizem drip and underwent TEE/DCCV with successful cardioversion back to normal sinus rhythm.  Patient will discharge on metoprolol tartrate 50 mg p.o. twice daily and Eliquis for anticoagulation.  Outpatient follow-up with cardiology as scheduled.

## 2021-12-04 NOTE — Progress Notes (Signed)
°  Echocardiogram 2D Echocardiogram has been performed.  Bobbye Charleston 12/04/2021, 3:26 PM

## 2021-12-04 NOTE — ED Notes (Signed)
RN and NT bladder scanned patient 3 times and scanner showed 71ml all 3 times. Pt states he usually urinates every hour and has not since 0300. 5/10 pain.

## 2021-12-04 NOTE — ED Triage Notes (Signed)
Pt reported to ED with c/o severe pain to lower abdomen since approximately 0230 this am. Pt repots having similar pain x24 hrs and was seen at urgent care yesterday, given meds for kidney stone. Report IM Toradol injection with therapeutic effects until pain returned this am. Also endorses difficulty urinating since 0300 this am.

## 2021-12-05 ENCOUNTER — Other Ambulatory Visit (HOSPITAL_COMMUNITY): Payer: Self-pay

## 2021-12-05 DIAGNOSIS — I4892 Unspecified atrial flutter: Secondary | ICD-10-CM | POA: Diagnosis not present

## 2021-12-05 DIAGNOSIS — I081 Rheumatic disorders of both mitral and tricuspid valves: Secondary | ICD-10-CM | POA: Diagnosis not present

## 2021-12-05 DIAGNOSIS — I483 Typical atrial flutter: Secondary | ICD-10-CM | POA: Diagnosis present

## 2021-12-05 DIAGNOSIS — E785 Hyperlipidemia, unspecified: Secondary | ICD-10-CM | POA: Diagnosis present

## 2021-12-05 DIAGNOSIS — I77810 Thoracic aortic ectasia: Secondary | ICD-10-CM | POA: Diagnosis present

## 2021-12-05 DIAGNOSIS — I4891 Unspecified atrial fibrillation: Secondary | ICD-10-CM | POA: Diagnosis present

## 2021-12-05 DIAGNOSIS — Z79899 Other long term (current) drug therapy: Secondary | ICD-10-CM | POA: Diagnosis not present

## 2021-12-05 DIAGNOSIS — I480 Paroxysmal atrial fibrillation: Secondary | ICD-10-CM | POA: Diagnosis present

## 2021-12-05 DIAGNOSIS — Z87442 Personal history of urinary calculi: Secondary | ICD-10-CM | POA: Diagnosis not present

## 2021-12-05 DIAGNOSIS — Z791 Long term (current) use of non-steroidal anti-inflammatories (NSAID): Secondary | ICD-10-CM | POA: Diagnosis not present

## 2021-12-05 DIAGNOSIS — N132 Hydronephrosis with renal and ureteral calculous obstruction: Secondary | ICD-10-CM | POA: Diagnosis present

## 2021-12-05 DIAGNOSIS — R739 Hyperglycemia, unspecified: Secondary | ICD-10-CM | POA: Diagnosis present

## 2021-12-05 DIAGNOSIS — Z9049 Acquired absence of other specified parts of digestive tract: Secondary | ICD-10-CM | POA: Diagnosis not present

## 2021-12-05 DIAGNOSIS — E871 Hypo-osmolality and hyponatremia: Secondary | ICD-10-CM | POA: Diagnosis present

## 2021-12-05 DIAGNOSIS — Z7982 Long term (current) use of aspirin: Secondary | ICD-10-CM | POA: Diagnosis not present

## 2021-12-05 DIAGNOSIS — I700 Atherosclerosis of aorta: Secondary | ICD-10-CM | POA: Diagnosis present

## 2021-12-05 LAB — BASIC METABOLIC PANEL
Anion gap: 7 (ref 5–15)
BUN: 16 mg/dL (ref 8–23)
CO2: 26 mmol/L (ref 22–32)
Calcium: 8.4 mg/dL — ABNORMAL LOW (ref 8.9–10.3)
Chloride: 107 mmol/L (ref 98–111)
Creatinine, Ser: 1.24 mg/dL (ref 0.61–1.24)
GFR, Estimated: >60 mL/min (ref >60)
Glucose, Bld: 108 mg/dL — ABNORMAL HIGH (ref 70–99)
Potassium: 3.7 mmol/L (ref 3.5–5.1)
Sodium: 140 mmol/L (ref 135–145)

## 2021-12-05 LAB — LIPID PANEL
Cholesterol: 178 mg/dL (ref 0–200)
HDL: 38 mg/dL — ABNORMAL LOW (ref >40)
LDL Cholesterol: 124 mg/dL — ABNORMAL HIGH (ref 0–99)
Total CHOL/HDL Ratio: 4.7 RATIO
Triglycerides: 80 mg/dL (ref <150)
VLDL: 16 mg/dL (ref 0–40)

## 2021-12-05 LAB — HIV ANTIBODY (ROUTINE TESTING W REFLEX): HIV Screen 4th Generation wRfx: NONREACTIVE

## 2021-12-05 LAB — CBC
HCT: 40.3 % (ref 39.0–52.0)
Hemoglobin: 14.3 g/dL (ref 13.0–17.0)
MCH: 32.4 pg (ref 26.0–34.0)
MCHC: 35.5 g/dL (ref 30.0–36.0)
MCV: 91.2 fL (ref 80.0–100.0)
Platelets: 182 10*3/uL (ref 150–400)
RBC: 4.42 MIL/uL (ref 4.22–5.81)
RDW: 12.5 % (ref 11.5–15.5)
WBC: 6.5 10*3/uL (ref 4.0–10.5)
nRBC: 0 % (ref 0.0–0.2)

## 2021-12-05 LAB — PROTIME-INR
INR: 1.2 (ref 0.8–1.2)
Prothrombin Time: 15.2 seconds (ref 11.4–15.2)

## 2021-12-05 LAB — RAPID URINE DRUG SCREEN, HOSP PERFORMED
Amphetamines: NOT DETECTED
Barbiturates: NOT DETECTED
Benzodiazepines: NOT DETECTED
Cocaine: NOT DETECTED
Opiates: NOT DETECTED
Tetrahydrocannabinol: NOT DETECTED

## 2021-12-05 MED ORDER — POLYETHYLENE GLYCOL 3350 17 G PO PACK
17.0000 g | PACK | Freq: Once | ORAL | Status: AC
  Administered 2021-12-05: 17 g via ORAL
  Filled 2021-12-05: qty 1

## 2021-12-05 MED ORDER — ATORVASTATIN CALCIUM 10 MG PO TABS
20.0000 mg | ORAL_TABLET | Freq: Every day | ORAL | Status: DC
  Administered 2021-12-05: 22:00:00 20 mg via ORAL
  Filled 2021-12-05: qty 2

## 2021-12-05 MED ORDER — SODIUM CHLORIDE 0.9 % IV SOLN: INTRAVENOUS | Status: DC

## 2021-12-05 MED ORDER — METOPROLOL TARTRATE 25 MG PO TABS
25.0000 mg | ORAL_TABLET | Freq: Three times a day (TID) | ORAL | Status: DC
  Administered 2021-12-05 – 2021-12-06 (×3): 25 mg via ORAL
  Filled 2021-12-05 (×3): qty 1

## 2021-12-05 NOTE — Assessment & Plan Note (Addendum)
Total cholesterol 178, HDL 38, LDL 124, triglycerides 80.  CT renal stone study with incidental finding of aortic atherosclerosis.  Start atorvastatin 20 mg p.o. daily.

## 2021-12-05 NOTE — Progress Notes (Signed)
Progress Note  Patient Name: Arthur Barrett Date of Encounter: 12/05/2021  Primary Cardiologist: None   Subjective   Patient was seen and examined at his bedside. His wife was present in the room during my encounter.  Inpatient Medications    Scheduled Meds:  apixaban  5 mg Oral BID   atorvastatin  20 mg Oral QHS   tamsulosin  0.4 mg Oral QPC supper   Continuous Infusions:  diltiazem (CARDIZEM) infusion 7.5 mg/hr (12/04/21 2109)   lactated ringers 100 mL/hr at 12/05/21 0717   PRN Meds: acetaminophen **OR** acetaminophen, hydrALAZINE, morphine injection, ondansetron **OR** ondansetron (ZOFRAN) IV   Vital Signs    Vitals:   12/04/21 1559 12/04/21 2108 12/05/21 0550 12/05/21 0810  BP:  115/73 118/77 122/83  Pulse:  (!) 58 (!) 54 100  Resp:   18 17  Temp:  98.5 F (36.9 C) 97.7 F (36.5 C) 98.7 F (37.1 C)  TempSrc:  Oral Oral Oral  SpO2: 97% 95% 95% 97%    Intake/Output Summary (Last 24 hours) at 12/05/2021 1149 Last data filed at 12/05/2021 1100 Gross per 24 hour  Intake 790.56 ml  Output 3000 ml  Net -2209.44 ml   There were no vitals filed for this visit.  Telemetry    Atrial fibrillation - Personally Reviewed  ECG    None today  - Personally Reviewed  Physical Exam     General: Comfortable Head: Atraumatic, normal size  Eyes: PEERLA, EOMI  Neck: Supple, normal JVD Cardiac: Normal S1, S2; RRR; no murmurs, rubs, or gallops Lungs: Clear to auscultation bilaterally Abd: Soft, nontender, no hepatomegaly  Ext: warm, no edema Musculoskeletal: No deformities, BUE and BLE strength normal and equal Skin: Warm and dry, no rashes   Neuro: Alert and oriented to person, place, time, and situation, CNII-XII grossly intact, no focal deficits  Psych: Normal mood and affect   Labs    Chemistry Recent Labs  Lab 12/04/21 0552 12/05/21 0331  NA 139 140  K 3.5 3.7  CL 105 107  CO2 23 26  GLUCOSE 138* 108*  BUN 15 16  CREATININE 1.41* 1.24  CALCIUM  8.6* 8.4*  PROT 6.7  --   ALBUMIN 3.8  --   AST 26  --   ALT 25  --   ALKPHOS 46  --   BILITOT 0.8  --   GFRNONAA 55* >60  ANIONGAP 11 7     Hematology Recent Labs  Lab 12/04/21 0552 12/05/21 0331  WBC 9.0 6.5  RBC 4.95 4.42  HGB 16.0 14.3  HCT 45.7 40.3  MCV 92.3 91.2  MCH 32.3 32.4  MCHC 35.0 35.5  RDW 12.6 12.5  PLT 197 182    Cardiac EnzymesNo results for input(s): TROPONINI in the last 168 hours. No results for input(s): TROPIPOC in the last 168 hours.   BNPNo results for input(s): BNP, PROBNP in the last 168 hours.   DDimer No results for input(s): DDIMER in the last 168 hours.   Radiology    ECHOCARDIOGRAM COMPLETE  Result Date: 12/04/2021    ECHOCARDIOGRAM REPORT   Patient Name:   Arthur Barrett Date of Exam: 12/04/2021 Medical Rec #:  [id number]         Height:       69.0 in Accession #:    [id number]        Weight:       165.0 lb Date of Birth:  02-28-1956  BSA:          1.904 m Patient Age:    66 years          BP:           134/84 mmHg Patient Gender: M                 HR:           76 bpm. Exam Location:  Inpatient Procedure: Cardiac Doppler, Color Doppler, 3D Echo and 2D Echo Indications:    I48.92* Unspecified atrial flutter  History:        Patient has no prior history of Echocardiogram examinations.                 Abnormal ECG; Arrythmias:Atrial Fibrillation and Atrial Flutter.  Sonographer:    Roseanna Rainbow RDCS Referring Phys: [id number] [place]  1. Left ventricular ejection fraction, by estimation, is 40 to 45%. The left ventricle has mildly decreased function. The left ventricle has no regional wall motion abnormalities. There is mild left ventricular hypertrophy. Left ventricular diastolic function could not be evaluated.  2. Right ventricular systolic function is mildly reduced. The right ventricular size is normal.  3. Left atrial size was mild to moderately dilated.  4. Right atrial size was mild to moderately dilated.  5. The mitral  valve is normal in structure. Trivial mitral valve regurgitation. No evidence of mitral stenosis.  6. The aortic valve is tricuspid. There is mild calcification of the aortic valve. Aortic valve regurgitation is not visualized. No aortic stenosis is present.  7. Aortic dilatation noted. There is mild dilatation of the ascending aorta, measuring 40 mm.  8. The inferior vena cava is normal in size with greater than 50% respiratory variability, suggesting right atrial pressure of 3 mmHg.  9. EF hard to assess in the setting of AFL. Appears 40-45%. FINDINGS  Left Ventricle: Left ventricular ejection fraction, by estimation, is 40 to 45%. The left ventricle has mildly decreased function. The left ventricle has no regional wall motion abnormalities. The left ventricular internal cavity size was normal in size. There is mild left ventricular hypertrophy. Left ventricular diastolic function could not be evaluated due to atrial fibrillation. Left ventricular diastolic function could not be evaluated. Right Ventricle: The right ventricular size is normal. No increase in right ventricular wall thickness. Right ventricular systolic function is mildly reduced. Left Atrium: Left atrial size was mild to moderately dilated. Right Atrium: Right atrial size was mild to moderately dilated. Pericardium: There is no evidence of pericardial effusion. Mitral Valve: The mitral valve is normal in structure. Trivial mitral valve regurgitation. No evidence of mitral valve stenosis. Tricuspid Valve: The tricuspid valve is normal in structure. Tricuspid valve regurgitation is trivial. No evidence of tricuspid stenosis. Aortic Valve: The aortic valve is tricuspid. There is mild calcification of the aortic valve. Aortic valve regurgitation is not visualized. No aortic stenosis is present. Pulmonic Valve: The pulmonic valve was grossly normal. Pulmonic valve regurgitation is not visualized. No evidence of pulmonic stenosis. Aorta: The aortic root  is normal in size and structure and aortic dilatation noted. There is mild dilatation of the ascending aorta, measuring 40 mm. Venous: The inferior vena cava is normal in size with greater than 50% respiratory variability, suggesting right atrial pressure of 3 mmHg. IAS/Shunts: No atrial level shunt detected by color flow Doppler.  LEFT VENTRICLE PLAX 2D LVIDd:         4.30 cm LVIDs:  3.00 cm LV PW:         1.10 cm LV IVS:        1.50 cm LVOT diam:     2.20 cm LV SV:         75 LV SV Index:   40 LVOT Area:     3.80 cm  LV Volumes (MOD) LV vol d, MOD A2C: 60.9 ml LV vol d, MOD A4C: 82.0 ml LV vol s, MOD A2C: 29.4 ml LV vol s, MOD A4C: 42.6 ml LV SV MOD A2C:     31.5 ml LV SV MOD A4C:     82.0 ml LV SV MOD BP:      35.5 ml RIGHT VENTRICLE             IVC RV S prime:     11.70 cm/s  IVC diam: 1.70 cm TAPSE (M-mode): 1.8 cm LEFT ATRIUM             Index        RIGHT ATRIUM           Index LA diam:        3.80 cm 2.00 cm/m   RA Area:     14.20 cm LA Vol (A2C):   30.5 ml 16.02 ml/m  RA Volume:   31.20 ml  16.39 ml/m LA Vol (A4C):   28.2 ml 14.81 ml/m LA Biplane Vol: 31.4 ml 16.49 ml/m  AORTIC VALVE LVOT Vmax:   117.00 cm/s LVOT Vmean:  82.300 cm/s LVOT VTI:    0.198 m  AORTA Ao Root diam: 3.40 cm Ao Asc diam:  4.00 cm MITRAL VALVE MV Area (PHT): 3.28 cm    SHUNTS MV Decel Time: 232 msec    Systemic VTI:  0.20 m MV E velocity: 78.60 cm/s  Systemic Diam: 2.20 cm MV A velocity: 64.98 cm/s MV E/A ratio:  1.21 Glori Bickers MD Electronically signed by Glori Bickers MD Signature Date/Time: 12/04/2021/7:05:01 PM    Final    CT Renal Stone Study  Result Date: 12/04/2021 CLINICAL DATA:  Flank pain.  Rule out kidney stone. EXAM: CT ABDOMEN AND PELVIS WITHOUT CONTRAST TECHNIQUE: Multidetector CT imaging of the abdomen and pelvis was performed following the standard protocol without IV contrast. RADIATION DOSE REDUCTION: This exam was performed according to the departmental dose-optimization program which  includes automated exposure control, adjustment of the mA and/or kV according to patient size and/or use of iterative reconstruction technique. COMPARISON:  None. FINDINGS: Lower chest: No acute abnormality. Hepatobiliary: No focal liver abnormality.  Gallbladder normal. Pancreas: Unremarkable. No pancreatic ductal dilatation or surrounding inflammatory changes. Spleen: Normal in size without focal abnormality. Adrenals/Urinary Tract: Normal adrenal glands. Upper pole right renal calculus measures 4 mm. No left renal calculi. There is right-sided perinephric fat stranding and hydronephrosis with hydroureter. Within the proximal right ureter there is a stone measuring 4 mm, image 78/6 and image 42/3. No left-sided hydronephrosis or hydroureter. Urinary bladder is unremarkable. Stomach/Bowel: Stomach is within normal limits. Status post appendectomy. No evidence of bowel wall thickening, distention, or inflammatory changes. Vascular/Lymphatic: Mild aortic atherosclerosis. No aneurysm. No abdominopelvic adenopathy. Reproductive: Prostate is unremarkable. Partially visualized small left hydrocele. Other: No free fluid or fluid collections identified. Musculoskeletal: Degenerative disc disease is noted at L5-S1. Chronic superior endplate deformity involving the L3 vertebral body is unchanged from 2016. IMPRESSION: 1. Right-sided hydronephrosis and hydroureter secondary to 4 mm proximal right ureteral calculus. 2. Aortic Atherosclerosis (ICD10-I70.0). Electronically Signed   By: Lovena Le  Clovis Riley M.D.   On: 12/04/2021 07:21    Cardiac Studies   TTE 11/03/2021 IMPRESSIONS     1. Left ventricular ejection fraction, by estimation, is 40 to 45%. The  left ventricle has mildly decreased function. The left ventricle has no  regional wall motion abnormalities. There is mild left ventricular  hypertrophy. Left ventricular diastolic  function could not be evaluated.   2. Right ventricular systolic function is mildly  reduced. The right  ventricular size is normal.   3. Left atrial size was mild to moderately dilated.   4. Right atrial size was mild to moderately dilated.   5. The mitral valve is normal in structure. Trivial mitral valve  regurgitation. No evidence of mitral stenosis.   6. The aortic valve is tricuspid. There is mild calcification of the  aortic valve. Aortic valve regurgitation is not visualized. No aortic  stenosis is present.   7. Aortic dilatation noted. There is mild dilatation of the ascending  aorta, measuring 40 mm.   8. The inferior vena cava is normal in size with greater than 50%  respiratory variability, suggesting right atrial pressure of 3 mmHg.   9. EF hard to assess in the setting of AFL. Appears 40-45%.   FINDINGS   Left Ventricle: Left ventricular ejection fraction, by estimation, is 40  to 45%. The left ventricle has mildly decreased function. The left  ventricle has no regional wall motion abnormalities. The left ventricular  internal cavity size was normal in  size. There is mild left ventricular hypertrophy. Left ventricular  diastolic function could not be evaluated due to atrial fibrillation. Left  ventricular diastolic function could not be evaluated.   Right Ventricle: The right ventricular size is normal. No increase in  right ventricular wall thickness. Right ventricular systolic function is  mildly reduced.   Left Atrium: Left atrial size was mild to moderately dilated.   Right Atrium: Right atrial size was mild to moderately dilated.   Pericardium: There is no evidence of pericardial effusion.   Mitral Valve: The mitral valve is normal in structure. Trivial mitral  valve regurgitation. No evidence of mitral valve stenosis.   Tricuspid Valve: The tricuspid valve is normal in structure. Tricuspid  valve regurgitation is trivial. No evidence of tricuspid stenosis.   Aortic Valve: The aortic valve is tricuspid. There is mild calcification  of the  aortic valve. Aortic valve regurgitation is not visualized. No  aortic stenosis is present.   Pulmonic Valve: The pulmonic valve was grossly normal. Pulmonic valve  regurgitation is not visualized. No evidence of pulmonic stenosis.   Aorta: The aortic root is normal in size and structure and aortic  dilatation noted. There is mild dilatation of the ascending aorta,  measuring 40 mm.   Venous: The inferior vena cava is normal in size with greater than 50%  respiratory variability, suggesting right atrial pressure of 3 mmHg.   IAS/Shunts: No atrial level shunt detected by color flow Doppler.   Patient Profile     66 y.o. male recently diagnosed atrial fibrillation after presenting for abdominal pain now with depressed ejection fraction.  Assessment & Plan    Atrial flutter/fibrillation Depressed ejection fraction   Recently diagnosed atrial fibrillation yesterday started on Eliquis and Cardizem drip.  Has been on Cardizem drip overnight with no improvement in his heart rate.  We will transition off Cardizem drip however to a beta-blocker Lopressor 25 mg every 8 hours for now with plans to transition Toprol-XL prior to  discharge. He remains on Eliquis plan for TEE cardioversion tomorrow. Please keep patient NPO past midnight. I have talked with the patient and his wife they are both in agreement with pursuing the TEE cardioversion.  There are no invasive plans for the ureter calculus and right-sided hydronephrosis.  Shared Decision Making/Informed Consent The risks [stroke, cardiac arrhythmias rarely resulting in the need for a temporary or permanent pacemaker, skin irritation or burns, esophageal damage, perforation (1:10,000 risk), bleeding, pharyngeal hematoma as well as other potential complications associated with conscious sedation including aspiration, arrhythmia, respiratory failure and death], benefits (treatment guidance, restoration of normal sinus rhythm, diagnostic support) and  alternatives of a transesophageal echocardiogram guided cardioversion were discussed in detail with Mr. Venuto and he is willing to proceed.    I discussed also the newly found depressed ejection fraction 40 to 45%.  The patient is wife had questions about what this mean.  I was able to answer their questions to their satisfaction.  I suspect that his atrial flutter with rapid ventricular rate may be contributing to his depressed ejection fraction.  Plan to transition his beta-blocker to Toprol-XL prior to discharge, hopefully if his blood pressure can tolerate it low-dose Entresto will be started as well.  AKI has resolved.  Plan discussed with the patient, his wife, as well as the hospitalist taking care of patient.     For questions or updates, please contact Sharon Please consult [email or web address] for contact info under Cardiology/STEMI.      Signed, Berniece Salines, DO  12/05/2021, 11:49 AM

## 2021-12-05 NOTE — TOC Benefit Eligibility Note (Signed)
Patient Advocate Encounter ° °Insurance verification completed.   ° °The patient is currently admitted and upon discharge could be taking Eliquis 5 mg. ° °The current 30 day co-pay is, $45.00.  ° °The patient is insured through [employer] Part D  ° ° ° °Tremell Reimers, CPhT °Pharmacy Patient Advocate Specialist °[facility] Patient Advocate Team °Direct Number: [phone]  Fax: [phone] ° ° ° ° ° °  °

## 2021-12-05 NOTE — Assessment & Plan Note (Signed)
TTE with noted ascending aortic dilation 40 mm. -- Will need continued outpatient follow-up/surveillance with PCP/cardiology

## 2021-12-05 NOTE — Hospital Course (Signed)
Arthur Barrett is a 66 year old male with no previous reported medical history who presented to [facility] ED on 2/15 with severe lower abdominal pain.  Reports pain starts in his testicle and went up to his right groin and towards his back.  Was recently seen at urgent care day prior and diagnosed with a kidney stone and received IM Toradol with initial improvement of his symptoms.  Patient also endorsed difficulty urinating as well.  On arrival to the ED, he reports his pain has been easing off.  Initially thought he pulled a muscle while cutting wood on Monday.  Denies any infectious symptoms such as fever/chills.  Recently patient got a Fitbit and did report his heart rate was high at [place], did not seem to go up with exertion but did not decrease with rest; otherwise patient was asymptomatic.  In the ED, temperature 98.9 F, HR 144, RR 22, BP 110/82, SPO2 95% on room air.  Sodium 139, potassium 3.5, chloride 105, CO2 23, glucose 138, BUN 15, creatinine 1.41.  AST 26, ALT 25, total bilirubin 0.8.  WBC 9.0, hemoglobin 16.0, platelets 197.  Urinalysis with moderate hemoglobin, rare bacteria, 0-5 WBCs.  UDS negative.  CT renal stone study with right-sided hydronephrosis/hydroureter secondary to 4 mm proximal right ureteral calculus, aortic atherosclerosis.  EDP consulted urology, recommended supportive care, IV fluid hydration no urgent surgical needs at this small kidney stone should pass on its own.  Cardiology was consulted.  TRH consulted for further evaluation and management of new onset atrial fibrillation with RVR.

## 2021-12-05 NOTE — Progress Notes (Signed)
PROGRESS NOTE    Kaveh Kissinger  YPP:[id number] DOB: Apr 23, 1956 DOA: 12/04/2021 PCP: Shon Baton, MD    Brief Narrative:  Erique Arthur Barrett is a 66 year old male with no previous reported medical history who presented to [facility] ED on 2/15 with severe lower abdominal pain.  Reports pain starts in his testicle and went up to his right groin and towards his back.  Was recently seen at urgent care day prior and diagnosed with a kidney stone and received IM Toradol with initial improvement of his symptoms.  Patient also endorsed difficulty urinating as well.  On arrival to the ED, he reports his pain has been easing off.  Initially thought he pulled a muscle while cutting wood on Monday.  Denies any infectious symptoms such as fever/chills.  Recently patient got a Fitbit and did report his heart rate was high at [place], did not seem to go up with exertion but did not decrease with rest; otherwise patient was asymptomatic.  In the ED, temperature 98.9 F, HR 144, RR 22, BP 110/82, SPO2 95% on room air.  Sodium 139, potassium 3.5, chloride 105, CO2 23, glucose 138, BUN 15, creatinine 1.41.  AST 26, ALT 25, total bilirubin 0.8.  WBC 9.0, hemoglobin 16.0, platelets 197.  Urinalysis with moderate hemoglobin, rare bacteria, 0-5 WBCs.  UDS negative.  CT renal stone study with right-sided hydronephrosis/hydroureter secondary to 4 mm proximal right ureteral calculus, aortic atherosclerosis.  EDP consulted urology, recommended supportive care, IV fluid hydration no urgent surgical needs at this small kidney stone should pass on its own.  Cardiology was consulted.  TRH consulted for further evaluation and management of new onset atrial fibrillation with RVR.    Assessment & Plan:   Assessment and Plan: * New onset atrial fibrillation ([place])- (present on admission) Patient presenting to ED and was found to be in new onset A-fib with RVR.  Recently bought a Fitbit and has had continuous heart rates in the 130s-150s  regardless of exercise/rest.  Has been asymptomatic.  Has not seen a physician in multiple years.  TSH within normal limits.  UDS negative.  TTE with LVEF 40-45%, LV mild decreased function, no regional wall motion abnormalities LV, mild LVH, biatrial enlargement, trivial MR, IVC normal in size, aortic ascending dilatation 40 mm.  CHA2DS2-VASc score = 1. --Cardiology following, appreciate assistance --Cardizem drip --Eliquis 5 mg p.o. twice daily --Further recommendations per cardiology.  Ureteral calculus, right- (present on admission) Patient presented with R renal colic and was found to have a 14mm proximal R ureteral calculus.  ED physician consulted urology with recommendations of supportive care, IV fluid hydration as renal stone small only 4 mm with likely to pass spontaneously. --LR at 100 mL/h --Tamsulosin 0.4 mg p.o. daily --Strain urine --Pain control  Renal dysfunction Patient has not seen a physician in many years and no recent labs to establish baseline.  On admission, creatinine up to 1.41 with a GFR of 55. --Cr 1.41>1.24 --Continue IVF with LR at 100 mL/h  --Avoid nephrotoxins, renally dose all medications --Follow BMP daily  Hyperlipidemia, Aortic atherosclerosis- (present on admission) Total cholesterol 178, HDL 38, LDL 124, triglycerides 80.  CT renal stone study with incidental finding of aortic atherosclerosis. --Start atorvastatin 40 mg p.o. 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 -- Will need continued outpatient follow-up/surveillance with PCP/cardiology     DVT prophylaxis:  apixaban (ELIQUIS) tablet 5 mg    Code Status: Full Code  Family Communication: Updated daughter present at bedside this morning  Disposition Plan:  Level of care: Progressive Status is: Observation The patient remains OBS appropriate and will d/c before 2 midnights.   Consultants:  Cardiology, Dr.  Harriet Masson  Procedures:  TTE  Antimicrobials:  none   Subjective: Patient seen examined at bedside, resting comfortably.  No specific complaints this morning.  Daughter present at bedside.  Remains pain-free.  Does not believe he has passed the stone yet.  Heart rate remains controlled on Cardizem drip.  No other questions or concerns at this time.  Denies headache, no dizziness, no chest pain, no palpitations, no shortness of breath, no abdominal pain, no fever/chills/night sweats, no nausea/vomiting/diarrhea, no weakness, no fatigue, no paresthesias.  No acute events overnight per nursing staff.  Objective: Vitals:   12/04/21 1559 12/04/21 2108 12/05/21 0550 12/05/21 0810  BP:  115/73 118/77 122/83  Pulse:  (!) 58 (!) 54 100  Resp:   18 17  Temp:  98.5 F (36.9 C) 97.7 F (36.5 C) 98.7 F (37.1 C)  TempSrc:  Oral Oral Oral  SpO2: 97% 95% 95% 97%    Intake/Output Summary (Last 24 hours) at 12/05/2021 1040 Last data filed at 12/05/2021 1025 Gross per 24 hour  Intake 1789.56 ml  Output 2200 ml  Net -410.44 ml   There were no vitals filed for this visit.  Examination:  Physical Exam: GEN: NAD, alert and oriented x 3, wd/wn HEENT: NCAT, PERRL, EOMI, sclera clear, MMM PULM: CTAB w/o wheezes/crackles, normal respiratory effort, on room air CV: Irregularly irregular rhythm, normal rate w/o M/G/R GI: abd soft, NTND, NABS, no R/G/M MSK: no peripheral edema, muscle strength globally intact 5/5 bilateral upper/lower extremities NEURO: CN II-XII intact, no focal deficits, sensation to light touch intact PSYCH: normal mood/affect Integumentary: dry/intact, no rashes or wounds    Data Reviewed: I have personally reviewed following labs and imaging studies  CBC: Recent Labs  Lab 12/04/21 0552 12/05/21 0331  WBC 9.0 6.5  NEUTROABS 7.0  --   HGB 16.0 14.3  HCT 45.7 40.3  MCV 92.3 91.2  PLT 197 852   Basic Metabolic Panel: Recent Labs  Lab 12/04/21 0552 12/04/21 0851  12/05/21 0331  NA 139  --  140  K 3.5  --  3.7  CL 105  --  107  CO2 23  --  26  GLUCOSE 138*  --  108*  BUN 15  --  16  CREATININE 1.41*  --  1.24  CALCIUM 8.6*  --  8.4*  MG  --  2.0  --    GFR: CrCl cannot be calculated (Unknown ideal weight.). Liver Function Tests: Recent Labs  Lab 12/04/21 0552  AST 26  ALT 25  ALKPHOS 46  BILITOT 0.8  PROT 6.7  ALBUMIN 3.8   No results for input(s): LIPASE, AMYLASE in the last 168 hours. No results for input(s): AMMONIA in the last 168 hours. Coagulation Profile: No results for input(s): INR, PROTIME in the last 168 hours. Cardiac Enzymes: No results for input(s): CKTOTAL, CKMB, CKMBINDEX, TROPONINI in the last 168 hours. BNP (last 3 results) No results for input(s): PROBNP in the last 8760 hours. HbA1C: Recent Labs    12/04/21 1901  HGBA1C 5.1   CBG: No results for input(s): GLUCAP in the last 168 hours. Lipid Profile: Recent Labs    12/05/21 0331  CHOL 178  HDL 38*  LDLCALC 124*  TRIG 80  CHOLHDL 4.7   Thyroid Function  Tests: Recent Labs    12/04/21 0851  TSH 1.355   Anemia Panel: No results for input(s): VITAMINB12, FOLATE, FERRITIN, TIBC, IRON, RETICCTPCT in the last 72 hours. Sepsis Labs: No results for input(s): PROCALCITON, LATICACIDVEN in the last 168 hours.  No results found for this or any previous visit (from the past 240 hour(s)).       Radiology Studies: ECHOCARDIOGRAM COMPLETE  Result Date: 12/04/2021    ECHOCARDIOGRAM REPORT   Patient Name:   EPHRAM KORNEGAY Date of Exam: 12/04/2021 Medical Rec #:  [id number]         Height:       69.0 in Accession #:    [id number]        Weight:       165.0 lb Date of Birth:  26-Feb-1956         BSA:          1.904 m Patient Age:    52 years          BP:           134/84 mmHg Patient Gender: M                 HR:           76 bpm. Exam Location:  Inpatient Procedure: Cardiac Doppler, Color Doppler, 3D Echo and 2D Echo Indications:    I48.92* Unspecified  atrial flutter  History:        Patient has no prior history of Echocardiogram examinations.                 Abnormal ECG; Arrythmias:Atrial Fibrillation and Atrial Flutter.  Sonographer:    Roseanna Rainbow RDCS Referring Phys: [id number] [place]  1. Left ventricular ejection fraction, by estimation, is 40 to 45%. The left ventricle has mildly decreased function. The left ventricle has no regional wall motion abnormalities. There is mild left ventricular hypertrophy. Left ventricular diastolic function could not be evaluated.  2. Right ventricular systolic function is mildly reduced. The right ventricular size is normal.  3. Left atrial size was mild to moderately dilated.  4. Right atrial size was mild to moderately dilated.  5. The mitral valve is normal in structure. Trivial mitral valve regurgitation. No evidence of mitral stenosis.  6. The aortic valve is tricuspid. There is mild calcification of the aortic valve. Aortic valve regurgitation is not visualized. No aortic stenosis is present.  7. Aortic dilatation noted. There is mild dilatation of the ascending aorta, measuring 40 mm.  8. The inferior vena cava is normal in size with greater than 50% respiratory variability, suggesting right atrial pressure of 3 mmHg.  9. EF hard to assess in the setting of AFL. Appears 40-45%. FINDINGS  Left Ventricle: Left ventricular ejection fraction, by estimation, is 40 to 45%. The left ventricle has mildly decreased function. The left ventricle has no regional wall motion abnormalities. The left ventricular internal cavity size was normal in size. There is mild left ventricular hypertrophy. Left ventricular diastolic function could not be evaluated due to atrial fibrillation. Left ventricular diastolic function could not be evaluated. Right Ventricle: The right ventricular size is normal. No increase in right ventricular wall thickness. Right ventricular systolic function is mildly reduced. Left Atrium: Left atrial  size was mild to moderately dilated. Right Atrium: Right atrial size was mild to moderately dilated. Pericardium: There is no evidence of pericardial effusion. Mitral Valve: The mitral valve is normal in structure. Trivial mitral valve regurgitation. No  evidence of mitral valve stenosis. Tricuspid Valve: The tricuspid valve is normal in structure. Tricuspid valve regurgitation is trivial. No evidence of tricuspid stenosis. Aortic Valve: The aortic valve is tricuspid. There is mild calcification of the aortic valve. Aortic valve regurgitation is not visualized. No aortic stenosis is present. Pulmonic Valve: The pulmonic valve was grossly normal. Pulmonic valve regurgitation is not visualized. No evidence of pulmonic stenosis. Aorta: The aortic root is normal in size and structure and aortic dilatation noted. There is mild dilatation of the ascending aorta, measuring 40 mm. Venous: The inferior vena cava is normal in size with greater than 50% respiratory variability, suggesting right atrial pressure of 3 mmHg. IAS/Shunts: No atrial level shunt detected by color flow Doppler.  LEFT VENTRICLE PLAX 2D LVIDd:         4.30 cm LVIDs:         3.00 cm LV PW:         1.10 cm LV IVS:        1.50 cm LVOT diam:     2.20 cm LV SV:         75 LV SV Index:   40 LVOT Area:     3.80 cm  LV Volumes (MOD) LV vol d, MOD A2C: 60.9 ml LV vol d, MOD A4C: 82.0 ml LV vol s, MOD A2C: 29.4 ml LV vol s, MOD A4C: 42.6 ml LV SV MOD A2C:     31.5 ml LV SV MOD A4C:     82.0 ml LV SV MOD BP:      35.5 ml RIGHT VENTRICLE             IVC RV S prime:     11.70 cm/s  IVC diam: 1.70 cm TAPSE (M-mode): 1.8 cm LEFT ATRIUM             Index        RIGHT ATRIUM           Index LA diam:        3.80 cm 2.00 cm/m   RA Area:     14.20 cm LA Vol (A2C):   30.5 ml 16.02 ml/m  RA Volume:   31.20 ml  16.39 ml/m LA Vol (A4C):   28.2 ml 14.81 ml/m LA Biplane Vol: 31.4 ml 16.49 ml/m  AORTIC VALVE LVOT Vmax:   117.00 cm/s LVOT Vmean:  82.300 cm/s LVOT VTI:     0.198 m  AORTA Ao Root diam: 3.40 cm Ao Asc diam:  4.00 cm MITRAL VALVE MV Area (PHT): 3.28 cm    SHUNTS MV Decel Time: 232 msec    Systemic VTI:  0.20 m MV E velocity: 78.60 cm/s  Systemic Diam: 2.20 cm MV A velocity: 64.98 cm/s MV E/A ratio:  1.21 Glori Bickers MD Electronically signed by Glori Bickers MD Signature Date/Time: 12/04/2021/7:05:01 PM    Final    CT Renal Stone Study  Result Date: 12/04/2021 CLINICAL DATA:  Flank pain.  Rule out kidney stone. EXAM: CT ABDOMEN AND PELVIS WITHOUT CONTRAST TECHNIQUE: Multidetector CT imaging of the abdomen and pelvis was performed following the standard protocol without IV contrast. RADIATION DOSE REDUCTION: This exam was performed according to the departmental dose-optimization program which includes automated exposure control, adjustment of the mA and/or kV according to patient size and/or use of iterative reconstruction technique. COMPARISON:  None. FINDINGS: Lower chest: No acute abnormality. Hepatobiliary: No focal liver abnormality.  Gallbladder normal. Pancreas: Unremarkable. No pancreatic ductal dilatation or surrounding inflammatory  changes. Spleen: Normal in size without focal abnormality. Adrenals/Urinary Tract: Normal adrenal glands. Upper pole right renal calculus measures 4 mm. No left renal calculi. There is right-sided perinephric fat stranding and hydronephrosis with hydroureter. Within the proximal right ureter there is a stone measuring 4 mm, image 78/6 and image 42/3. No left-sided hydronephrosis or hydroureter. Urinary bladder is unremarkable. Stomach/Bowel: Stomach is within normal limits. Status post appendectomy. No evidence of bowel wall thickening, distention, or inflammatory changes. Vascular/Lymphatic: Mild aortic atherosclerosis. No aneurysm. No abdominopelvic adenopathy. Reproductive: Prostate is unremarkable. Partially visualized small left hydrocele. Other: No free fluid or fluid collections identified. 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 Chronic superior endplate deformity involving the L3 vertebral body is unchanged from 2016. IMPRESSION: 1. Right-sided hydronephrosis and hydroureter secondary to 4 mm proximal right ureteral calculus. 2. Aortic Atherosclerosis (ICD10-I70.0). Electronically Signed   By: Kerby Moors M.D.   On: 12/04/2021 07:21        Scheduled Meds:  apixaban  5 mg Oral BID   atorvastatin  20 mg Oral Daily   tamsulosin  0.4 mg Oral QPC supper   Continuous Infusions:  diltiazem (CARDIZEM) infusion 7.5 mg/hr (12/04/21 2109)   lactated ringers 100 mL/hr at 12/05/21 0717     LOS: 0 days    Time spent: 46 minutes spent on chart review, discussion with nursing staff, consultants, updating family and interview/physical exam; more than 50% of that time was spent in counseling and/or coordination of care.    Houa Ackert J [place] DO [facility] Available via Epic secure chat 7am-7pm After these hours, please refer to coverage provider listed on [email or web address] 12/05/2021, 10:40 AM

## 2021-12-05 NOTE — Discharge Instructions (Addendum)

## 2021-12-06 ENCOUNTER — Encounter (HOSPITAL_COMMUNITY)
Admission: EM | Disposition: A | Discharge status: Home / Self Care | Payer: Self-pay | Source: Home / Self Care | Attending: Internal Medicine

## 2021-12-06 ENCOUNTER — Other Ambulatory Visit (HOSPITAL_COMMUNITY): Payer: Self-pay

## 2021-12-06 ENCOUNTER — Inpatient Hospital Stay (HOSPITAL_COMMUNITY): Payer: PPO

## 2021-12-06 ENCOUNTER — Encounter (HOSPITAL_COMMUNITY): Payer: Self-pay | Admitting: Internal Medicine

## 2021-12-06 ENCOUNTER — Inpatient Hospital Stay (HOSPITAL_COMMUNITY): Payer: PPO | Admitting: Certified Registered Nurse Anesthetist

## 2021-12-06 DIAGNOSIS — I4891 Unspecified atrial fibrillation: Secondary | ICD-10-CM | POA: Diagnosis not present

## 2021-12-06 DIAGNOSIS — I4892 Unspecified atrial flutter: Secondary | ICD-10-CM | POA: Diagnosis present

## 2021-12-06 DIAGNOSIS — Z9289 Personal history of other medical treatment: Secondary | ICD-10-CM

## 2021-12-06 DIAGNOSIS — I081 Rheumatic disorders of both mitral and tricuspid valves: Secondary | ICD-10-CM

## 2021-12-06 HISTORY — DX: Personal history of other medical treatment: Z92.89

## 2021-12-06 HISTORY — PX: CARDIOVERSION: SHX1299

## 2021-12-06 HISTORY — PX: TEE WITHOUT CARDIOVERSION: SHX5443

## 2021-12-06 LAB — BASIC METABOLIC PANEL
Anion gap: 8 (ref 5–15)
BUN: 15 mg/dL (ref 8–23)
CO2: 26 mmol/L (ref 22–32)
Calcium: 9.1 mg/dL (ref 8.9–10.3)
Chloride: 105 mmol/L (ref 98–111)
Creatinine, Ser: 1.1 mg/dL (ref 0.61–1.24)
GFR, Estimated: >60 mL/min (ref >60)
Glucose, Bld: 108 mg/dL — ABNORMAL HIGH (ref 70–99)
Potassium: 3.9 mmol/L (ref 3.5–5.1)
Sodium: 139 mmol/L (ref 135–145)

## 2021-12-06 LAB — MAGNESIUM: Magnesium: 2 mg/dL (ref 1.7–2.4)

## 2021-12-06 LAB — ECHO TEE: Single Plane A4C EF: 52.1 %

## 2021-12-06 SURGERY — ECHOCARDIOGRAM, TRANSESOPHAGEAL
Anesthesia: Monitor Anesthesia Care

## 2021-12-06 MED ORDER — METOPROLOL TARTRATE 50 MG PO TABS
50.0000 mg | ORAL_TABLET | Freq: Two times a day (BID) | ORAL | 2 refills | Status: DC
  Filled 2021-12-06 – 2021-12-11 (×2): qty 60, 30d supply, fill #0
  Filled ????-??-??: fill #0

## 2021-12-06 MED ORDER — POTASSIUM CHLORIDE CRYS ER 20 MEQ PO TBCR
20.0000 meq | EXTENDED_RELEASE_TABLET | Freq: Once | ORAL | Status: AC
  Administered 2021-12-06: 20 meq via ORAL
  Filled 2021-12-06: qty 1

## 2021-12-06 MED ORDER — METOPROLOL TARTRATE 50 MG PO TABS: 50.0000 mg | ORAL_TABLET | Freq: Two times a day (BID) | ORAL | Status: DC

## 2021-12-06 MED ORDER — TAMSULOSIN HCL 0.4 MG PO CAPS
0.4000 mg | ORAL_CAPSULE | Freq: Every day | ORAL | 2 refills | Status: DC
Start: 2021-12-06 — End: 2022-02-04
  Filled 2021-12-06 – 2022-01-02 (×4): qty 30, 30d supply, fill #0
  Filled 2022-01-28: qty 30, 30d supply, fill #1
  Filled ????-??-??: fill #0

## 2021-12-06 MED ORDER — APIXABAN 5 MG PO TABS
5.0000 mg | ORAL_TABLET | Freq: Two times a day (BID) | ORAL | 2 refills | Status: DC
  Filled 2021-12-06 – 2022-01-02 (×4): qty 60, 30d supply, fill #0
  Filled 2022-01-28: qty 60, 30d supply, fill #1
  Filled 2022-03-12: qty 6, 3d supply, fill #2
  Filled ????-??-??: fill #0

## 2021-12-06 MED ORDER — METOPROLOL TARTRATE 25 MG PO TABS
25.0000 mg | ORAL_TABLET | Freq: Once | ORAL | Status: AC
  Administered 2021-12-06: 25 mg via ORAL
  Filled 2021-12-06: qty 1

## 2021-12-06 MED ORDER — PROPOFOL 500 MG/50ML IV EMUL
INTRAVENOUS | Status: DC | PRN
Start: 2021-12-06 — End: 2021-12-06
  Administered 2021-12-06: 125 ug/kg/min via INTRAVENOUS

## 2021-12-06 MED ORDER — PROPOFOL 10 MG/ML IV BOLUS
INTRAVENOUS | Status: DC | PRN
  Administered 2021-12-06 (×2): 20 mg via INTRAVENOUS

## 2021-12-06 MED ORDER — ATORVASTATIN CALCIUM 20 MG PO TABS
20.0000 mg | ORAL_TABLET | Freq: Every day | ORAL | 2 refills | Status: DC
  Filled 2021-12-06 – 2022-01-02 (×4): qty 30, 30d supply, fill #0
  Filled 2022-01-28: qty 30, 30d supply, fill #1
  Filled ????-??-??: fill #0

## 2021-12-06 NOTE — Progress Notes (Signed)
Progress Note  Patient Name: Arthur Barrett Date of Encounter: 12/06/2021  Primary Cardiologist: None   Subjective   Patient seen examined his bedside.  Still NPO.  Plan for TEE/cardioversion today.  Inpatient Medications    Scheduled Meds:  apixaban  5 mg Oral BID   atorvastatin  20 mg Oral QHS   metoprolol tartrate  50 mg Oral BID   tamsulosin  0.4 mg Oral QPC supper   Continuous Infusions:  sodium chloride     lactated ringers 100 mL/hr at 12/06/21 0249   PRN Meds: acetaminophen **OR** acetaminophen, hydrALAZINE, morphine injection, ondansetron **OR** ondansetron (ZOFRAN) IV   Vital Signs    Vitals:   12/05/21 1952 12/05/21 2216 12/06/21 0628 12/06/21 0804  BP: 134/87 (!) 138/104 (!) 120/98 120/86  Pulse: 90 71 (!) 140 (!) 140  Resp: 18 16  17   Temp: 98.2 F (36.8 C)  97.8 F (36.6 C)   TempSrc: Oral  Oral   SpO2: 95% 94% 98% 98%  Weight:      Height:        Intake/Output Summary (Last 24 hours) at 12/06/2021 0852 Last data filed at 12/06/2021 0851 Gross per 24 hour  Intake 360 ml  Output 3851 ml  Net -3491 ml   Filed Weights   12/05/21 1337  Weight: 80.2 kg    Telemetry    Atrial flutter- Personally Reviewed  ECG    None- Personally Reviewed  Physical Exam    General: Comfortable Head: Atraumatic, normal size  Eyes: PEERLA, EOMI  Neck: Supple, normal JVD Cardiac: Normal S1, S2; RRR; no murmurs, rubs, or gallops Lungs: Clear to auscultation bilaterally Abd: Soft, nontender, no hepatomegaly  Ext: warm, no edema Musculoskeletal: No deformities, BUE and BLE strength normal and equal Skin: Warm and dry, no rashes   Neuro: Alert and oriented to person, place, time, and situation, CNII-XII grossly intact, no focal deficits  Psych: Normal mood and affect   Labs    Chemistry Recent Labs  Lab 12/04/21 0552 12/05/21 0331 12/06/21 0304  NA 139 140 139  K 3.5 3.7 3.9  CL 105 107 105  CO2 23 26 26   GLUCOSE 138* 108* 108*  BUN 15 16  15   CREATININE 1.41* 1.24 1.10  CALCIUM 8.6* 8.4* 9.1  PROT 6.7  --   --   ALBUMIN 3.8  --   --   AST 26  --   --   ALT 25  --   --   ALKPHOS 46  --   --   BILITOT 0.8  --   --   GFRNONAA 55* >60 >60  ANIONGAP 11 7 8      Hematology Recent Labs  Lab 12/04/21 0552 12/05/21 0331  WBC 9.0 6.5  RBC 4.95 4.42  HGB 16.0 14.3  HCT 45.7 40.3  MCV 92.3 91.2  MCH 32.3 32.4  MCHC 35.0 35.5  RDW 12.6 12.5  PLT 197 182    Cardiac EnzymesNo results for input(s): TROPONINI in the last 168 hours. No results for input(s): TROPIPOC in the last 168 hours.   BNPNo results for input(s): BNP, PROBNP in the last 168 hours.   DDimer No results for input(s): DDIMER in the last 168 hours.   Radiology    ECHOCARDIOGRAM COMPLETE  Result Date: 12/04/2021    ECHOCARDIOGRAM REPORT   Patient Name:   Arthur Barrett Date of Exam: 12/04/2021 Medical Rec #:  [id number]         Height:  69.0 in Accession #:    [id number]        Weight:       165.0 lb Date of Birth:  1955/11/12         BSA:          1.904 m Patient Age:    66 years          BP:           134/84 mmHg Patient Gender: M                 HR:           76 bpm. Exam Location:  Inpatient Procedure: Cardiac Doppler, Color Doppler, 3D Echo and 2D Echo Indications:    I48.92* Unspecified atrial flutter  History:        Patient has no prior history of Echocardiogram examinations.                 Abnormal ECG; Arrythmias:Atrial Fibrillation and Atrial Flutter.  Sonographer:    Roseanna Rainbow RDCS Referring Phys: [id number] Kleberg  1. Left ventricular ejection fraction, by estimation, is 40 to 45%. The left ventricle has mildly decreased function. The left ventricle has no regional wall motion abnormalities. There is mild left ventricular hypertrophy. Left ventricular diastolic function could not be evaluated.  2. Right ventricular systolic function is mildly reduced. The right ventricular size is normal.  3. Left atrial size was mild to moderately  dilated.  4. Right atrial size was mild to moderately dilated.  5. The mitral valve is normal in structure. Trivial mitral valve regurgitation. No evidence of mitral stenosis.  6. The aortic valve is tricuspid. There is mild calcification of the aortic valve. Aortic valve regurgitation is not visualized. No aortic stenosis is present.  7. Aortic dilatation noted. There is mild dilatation of the ascending aorta, measuring 40 mm.  8. The inferior vena cava is normal in size with greater than 50% respiratory variability, suggesting right atrial pressure of 3 mmHg.  9. EF hard to assess in the setting of AFL. Appears 40-45%. FINDINGS  Left Ventricle: Left ventricular ejection fraction, by estimation, is 40 to 45%. The left ventricle has mildly decreased function. The left ventricle has no regional wall motion abnormalities. The left ventricular internal cavity size was normal in size. There is mild left ventricular hypertrophy. Left ventricular diastolic function could not be evaluated due to atrial fibrillation. Left ventricular diastolic function could not be evaluated. Right Ventricle: The right ventricular size is normal. No increase in right ventricular wall thickness. Right ventricular systolic function is mildly reduced. Left Atrium: Left atrial size was mild to moderately dilated. Right Atrium: Right atrial size was mild to moderately dilated. Pericardium: There is no evidence of pericardial effusion. Mitral Valve: The mitral valve is normal in structure. Trivial mitral valve regurgitation. No evidence of mitral valve stenosis. Tricuspid Valve: The tricuspid valve is normal in structure. Tricuspid valve regurgitation is trivial. No evidence of tricuspid stenosis. Aortic Valve: The aortic valve is tricuspid. There is mild calcification of the aortic valve. Aortic valve regurgitation is not visualized. No aortic stenosis is present. Pulmonic Valve: The pulmonic valve was grossly normal. Pulmonic valve  regurgitation is not visualized. No evidence of pulmonic stenosis. Aorta: The aortic root is normal in size and structure and aortic dilatation noted. There is mild dilatation of the ascending aorta, measuring 40 mm. Venous: The inferior vena cava is normal in size with greater than 50% respiratory variability,  suggesting right atrial pressure of 3 mmHg. IAS/Shunts: No atrial level shunt detected by color flow Doppler.  LEFT VENTRICLE PLAX 2D LVIDd:         4.30 cm LVIDs:         3.00 cm LV PW:         1.10 cm LV IVS:        1.50 cm LVOT diam:     2.20 cm LV SV:         75 LV SV Index:   40 LVOT Area:     3.80 cm  LV Volumes (MOD) LV vol d, MOD A2C: 60.9 ml LV vol d, MOD A4C: 82.0 ml LV vol s, MOD A2C: 29.4 ml LV vol s, MOD A4C: 42.6 ml LV SV MOD A2C:     31.5 ml LV SV MOD A4C:     82.0 ml LV SV MOD BP:      35.5 ml RIGHT VENTRICLE             IVC RV S prime:     11.70 cm/s  IVC diam: 1.70 cm TAPSE (M-mode): 1.8 cm LEFT ATRIUM             Index        RIGHT ATRIUM           Index LA diam:        3.80 cm 2.00 cm/m   RA Area:     14.20 cm LA Vol (A2C):   30.5 ml 16.02 ml/m  RA Volume:   31.20 ml  16.39 ml/m LA Vol (A4C):   28.2 ml 14.81 ml/m LA Biplane Vol: 31.4 ml 16.49 ml/m  AORTIC VALVE LVOT Vmax:   117.00 cm/s LVOT Vmean:  82.300 cm/s LVOT VTI:    0.198 m  AORTA Ao Root diam: 3.40 cm Ao Asc diam:  4.00 cm MITRAL VALVE MV Area (PHT): 3.28 cm    SHUNTS MV Decel Time: 232 msec    Systemic VTI:  0.20 m MV E velocity: 78.60 cm/s  Systemic Diam: 2.20 cm MV A velocity: 64.98 cm/s MV E/A ratio:  1.21 Glori Bickers MD Electronically signed by Glori Bickers MD Signature Date/Time: 12/04/2021/7:05:01 PM    Final     Cardiac Studies   TTE 11/03/2021 IMPRESSIONS     1. Left ventricular ejection fraction, by estimation, is 40 to 45%. The  left ventricle has mildly decreased function. The left ventricle has no  regional wall motion abnormalities. There is mild left ventricular  hypertrophy. Left  ventricular diastolic  function could not be evaluated.   2. Right ventricular systolic function is mildly reduced. The right  ventricular size is normal.   3. Left atrial size was mild to moderately dilated.   4. Right atrial size was mild to moderately dilated.   5. The mitral valve is normal in structure. Trivial mitral valve  regurgitation. No evidence of mitral stenosis.   6. The aortic valve is tricuspid. There is mild calcification of the  aortic valve. Aortic valve regurgitation is not visualized. No aortic  stenosis is present.   7. Aortic dilatation noted. There is mild dilatation of the ascending  aorta, measuring 40 mm.   8. The inferior vena cava is normal in size with greater than 50%  respiratory variability, suggesting right atrial pressure of 3 mmHg.   9. EF hard to assess in the setting of AFL. Appears 40-45%.   FINDINGS   Left Ventricle: Left ventricular ejection  fraction, by estimation, is 40  to 45%. The left ventricle has mildly decreased function. The left  ventricle has no regional wall motion abnormalities. The left ventricular  internal cavity size was normal in  size. There is mild left ventricular hypertrophy. Left ventricular  diastolic function could not be evaluated due to atrial fibrillation. Left  ventricular diastolic function could not be evaluated.   Right Ventricle: The right ventricular size is normal. No increase in  right ventricular wall thickness. Right ventricular systolic function is  mildly reduced.   Left Atrium: Left atrial size was mild to moderately dilated.   Right Atrium: Right atrial size was mild to moderately dilated.   Pericardium: There is no evidence of pericardial effusion.   Mitral Valve: The mitral valve is normal in structure. Trivial mitral  valve regurgitation. No evidence of mitral valve stenosis.   Tricuspid Valve: The tricuspid valve is normal in structure. Tricuspid  valve regurgitation is trivial. No evidence  of tricuspid stenosis.   Aortic Valve: The aortic valve is tricuspid. There is mild calcification  of the aortic valve. Aortic valve regurgitation is not visualized. No  aortic stenosis is present.   Pulmonic Valve: The pulmonic valve was grossly normal. Pulmonic valve  regurgitation is not visualized. No evidence of pulmonic stenosis.   Aorta: The aortic root is normal in size and structure and aortic  dilatation noted. There is mild dilatation of the ascending aorta,  measuring 40 mm.   Venous: The inferior vena cava is normal in size with greater than 50%  respiratory variability, suggesting right atrial pressure of 3 mmHg.   IAS/Shunts: No atrial level shunt detected by color flow Doppler.   Patient Profile     66 y.o. male diagnosed with atrial fibrillation now atrial flutter presenting with abdominal pain.  With newly recognized mild depressed ejection fraction.  Assessment & Plan    Atrial flutter/fibrillation Mild depressed ejection fraction  Still into the 1 flutter with rapid ventricular rate.  Transition the patient to Lopressor yesterday has not helped.  His metoprolol tartrate was increased to 50 mg twice daily agree with this. He is on the schedule for TEE cardioversion today.  He understands that he will remain on his Eliquis for 4 weeks or greater post cardioversion.  Shared Decision Making/Informed Consent The risks [stroke, cardiac arrhythmias rarely resulting in the need for a temporary or permanent pacemaker, skin irritation or burns, esophageal damage, perforation (1:10,000 risk), bleeding, pharyngeal hematoma as well as other potential complications associated with conscious sedation including aspiration, arrhythmia, respiratory failure and death], benefits (treatment guidance, restoration of normal sinus rhythm, diagnostic support) and alternatives of a transesophageal echocardiogram guided cardioversion were discussed in detail with Mr. Ewan and he is  willing to proceed.    Depressed ejection fraction-he is currently on Lopressor eventually transition to Toprol XL, if blood pressure can tolerate we will start the patient on low-dose Entresto 24-26 twice daily prior to discharge if not this can be addressed in the outpatient setting.     For questions or updates, please contact [facility] Please consult [email or web address] for contact info under Cardiology/STEMI.      Signed, Berniece Salines, DO  12/06/2021, 8:52 AM

## 2021-12-06 NOTE — Progress Notes (Signed)
°   12/06/21 0628  Assess: MEWS Score  Temp 97.8 F (36.6 C)  BP (!) 120/98  Pulse Rate (!) 140  ECG Heart Rate (!) 132  Level of Consciousness Alert  SpO2 98 %  O2 Device Room Air  Assess: MEWS Score  MEWS Temp 0  MEWS Systolic 0  MEWS Pulse 3  MEWS RR 0  MEWS LOC 0  MEWS Score 3  MEWS Score Color Yellow  Assess: if the MEWS score is Yellow or Red  Were vital signs taken at a resting state? Yes  Focused Assessment No change from prior assessment  Early Detection of Sepsis Score *See Row Information* Low  MEWS guidelines implemented *See Row Information* Yes  Take Vital Signs  Increase Vital Sign Frequency  Yellow: Q 2hr X 2 then Q 4hr X 2, if remains yellow, continue Q 4hrs  Escalate  MEWS: Escalate Yellow: discuss with charge nurse/RN and consider discussing with provider and RRT  Notify: Charge Nurse/RN  Name of Charge Nurse/RN Notified Marcelyn Ditty  Date Charge Nurse/RN Notified 12/06/21  Time Charge Nurse/RN Notified [id number]  Document  Patient Outcome Stabilized after interventions  Progress note created (see row info) Yes

## 2021-12-06 NOTE — CV Procedure (Signed)
TEE/CARDIOVERSION NOTE  TRANSESOPHAGEAL ECHOCARDIOGRAM (TEE):  Indictation: Atrial Flutter  Consent:   Informed consent was obtained prior to the procedure. The risks, benefits and alternatives for the procedure were discussed and the patient comprehended these risks.  Risks include, but are not limited to, cough, sore throat, vomiting, nausea, somnolence, esophageal and stomach trauma or perforation, bleeding, low blood pressure, aspiration, pneumonia, infection, trauma to the teeth and death.    Time Out: Verified patient identification, verified procedure, site/side was marked, verified correct patient position, special equipment/implants available, medications/allergies/relevent history reviewed, required imaging and test results available. Performed  Procedure:  After a procedural time-out, the patient was given propofol per anesthesia for sedation. The patient's heart rate, blood pressure, and oxygen saturation are monitored continuously during the procedure. The oropharynx was anesthetized with topical cetacaine.  The transesophageal probe was inserted in the esophagus and stomach without difficulty and multiple views were obtained. Agitated microbubble saline contrast was not administered.  Complications:    Complications: None Patient did tolerate procedure well.  Findings:  LEFT VENTRICLE: The left ventricular wall thickness is normal.  The left ventricular cavity is normal in size. Wall motion demonstrates global hypokinesis LVEF is 40-45%.  RIGHT VENTRICLE:  The right ventricle is normal in structure and function without any thrombus or masses.    LEFT ATRIUM:  The left atrium is moderately dilated in size without any thrombus or masses.  There is not spontaneous echo contrast ("smoke") in the left atrium consistent with a low flow state.  LEFT ATRIAL APPENDAGE:  The left atrial appendage is free of any thrombus or masses. The appendage has single lobes. Pulse doppler  indicates high flow in the appendage.  ATRIAL SEPTUM:  The atrial septum appears intact and is free of thrombus and/or masses.  There is no evidence for interatrial shunting by color doppler.  RIGHT ATRIUM:  The right atrium is moderately dilated in size and function without any thrombus or masses.  MITRAL VALVE:  The mitral valve is normal in structure and function with  trivial  regurgitation.  There were no vegetations or stenosis.  AORTIC VALVE:  The aortic valve is trileaflet, normal in structure and function with  no  regurgitation.  There were no vegetations or stenosis  TRICUSPID VALVE:  The tricuspid valve is normal in structure and function with  trivial  regurgitation.  There were no vegetations or stenosis   PULMONIC VALVE:  The pulmonic valve is normal in structure and function with  no  regurgitation.  There were no vegetations or stenosis.   AORTIC ARCH, ASCENDING AND DESCENDING AORTA:  There was no Ron Parker et. Al, 1992) atherosclerosis of the ascending aorta, aortic arch, or proximal descending aorta. Borderline dilated ascending aorta to 38 mm.  12. PULMONARY VEINS: Anomalous pulmonary venous return was not noted.  13. PERICARDIUM: The pericardium appeared normal and non-thickened.  There is no pericardial effusion.  CARDIOVERSION:     Second Time Out: Verified patient identification, verified procedure, site/side was marked, verified correct patient position, special equipment/implants available, medications/allergies/relevent history reviewed, required imaging and test results available.  Performed  Procedure:  Patient placed on cardiac monitor, pulse oximetry, supplemental oxygen as necessary.  Sedation administered per anesthesia Pacer pads placed anterior and posterior chest. Cardioverted 1 time(s).  Cardioverted at 120J biphasic.  Complications:  Complications: None Patient did tolerate procedure well.  Impression:  No LAA thrombus Negative for PFO by  color doppler Borderline dilated ascending aorta at 38 mm. LVEF 40-45%  pre-cardioversion. Successful DCCV with a single 120J biphasic shock to NSR.  LVEF improved to 50-55% by TTE after the procedure.  Recommendations:   As per cardiology service.  Time Spent Directly with the Patient:  45 minutes   Arthur Casino, MD, [facility], [place] Director of the [facility]  [facility] Diplomate of the American Board of Clinical Lipidology Attending Cardiologist  Direct Dial: [phone]   Fax: [phone]  Website:  [email or web address] 12/06/2021, 3:01 PM

## 2021-12-06 NOTE — Progress Notes (Signed)
Paged regarding HR 140s, will give an extra dose of metoprolol 25mg  this morning and change metoprolol to 50mg  BID.

## 2021-12-06 NOTE — Care Management (Signed)
°  Transition of Care Practice [facility]) Screening Note   Patient Details  Name: Arthur Barrett Date of Birth: Dec 05, 1955   Transition of Care [facility]) CM/SW Contact:    Bethena Roys, RN Phone Number: 12/06/2021, 11:55 AM  Transition of Care Department [facility]) has reviewed the patient and no TOC needs have been identified at this time. Case Manager did speak with the patient regarding PCP needs consult. Patient states he will see Dr. Virgina Jock on March 16.2023. Patient states he was independent prior to transitioning to the hospital. No further needs identified at this time.

## 2021-12-06 NOTE — Discharge Summary (Signed)
Physician Discharge Summary  Arthur Barrett XBJ:[id number] DOB: [birth year] DOA: 12/04/2021  PCP: Shon Baton, MD  Admit date: 12/04/2021 Discharge date: 12/06/2021  Admitted From: Home Disposition: Home  Recommendations for Outpatient Follow-up:  Follow up with PCP in 1-2 weeks Follow-up with cardiology outpatient as scheduled Recommend outpatient follow-up with urology to ensure passing of kidney stone Started on metoprolol tartrate 50 mg p.o. twice daily Started on anticoagulation with Eliquis for new onset atrial fibrillation   Home Health: No Equipment/Devices: None  Discharge Condition: Stable CODE STATUS: Full code Diet recommendation: Heart healthy diet  History of present illness:  Arthur Barrett is a 66 year old male with no previous reported medical history who presented to [facility] ED on 2/15 with severe lower abdominal pain.  Reports pain starts in his testicle and went up to his right groin and towards his back.  Was recently seen at urgent care day prior and diagnosed with a kidney stone and received IM Toradol with initial improvement of his symptoms.  Patient also endorsed difficulty urinating as well.  On arrival to the ED, he reports his pain has been easing off.  Initially thought he pulled a muscle while cutting wood on Monday.  Denies any infectious symptoms such as fever/chills.  Recently patient got a Fitbit and did report his heart rate was high at Barrett, did not seem to go up with exertion but did not decrease with rest; otherwise patient was asymptomatic.  In the ED, temperature 98.9 F, HR 144, RR 22, BP 110/82, SPO2 95% on room air.  Sodium 139, potassium 3.5, chloride 105, CO2 23, glucose 138, BUN 15, creatinine 1.41.  AST 26, ALT 25, total bilirubin 0.8.  WBC 9.0, hemoglobin 16.0, platelets 197.  Urinalysis with moderate hemoglobin, rare bacteria, 0-5 WBCs.  UDS negative.  CT renal stone study with right-sided hydronephrosis/hydroureter secondary to 4 mm  proximal right ureteral calculus, aortic atherosclerosis.  EDP consulted urology, recommended supportive care, IV fluid hydration no urgent surgical needs at this small kidney stone should pass on its own.  Cardiology was consulted.  TRH consulted for further evaluation and management of new onset atrial fibrillation with RVR.  Hospital course:  Assessment and Plan: * New onset atrial fibrillation ([place])- (present on admission) Patient presenting to ED and was found to be in new onset A-fib with RVR.  Recently bought a Fitbit and has had continuous heart rates in the 130s-150s regardless of exercise/rest.  Has been asymptomatic.  Has not seen a physician in multiple years.  TSH within normal limits.  UDS negative.  TTE with LVEF 40-45%, LV mild decreased function, no regional wall motion abnormalities LV, mild LVH, biatrial enlargement, trivial MR, IVC normal in size, aortic ascending dilatation 40 mm.  CHA2DS2-VASc score = 1.  Cardiology was consulted and followed during hospital course.  Patient was initially started on a Cardizem drip and underwent TEE/DCCV with successful cardioversion back to normal sinus rhythm.  Patient will discharge on metoprolol tartrate 50 mg p.o. twice daily and Eliquis for anticoagulation.  Outpatient follow-up with cardiology as scheduled.   Ureteral calculus, right- (present on admission) Patient presented with R renal colic and was found to have a 46mm proximal R ureteral calculus.  ED physician consulted urology with recommendations of supportive care, IV fluid hydration as renal stone small only 4 mm with likely to pass spontaneously.  Started on tamsulosin 0.4 mg p.o. daily.  Outpatient follow-up with urology in 2-3 weeks.  Renal dysfunction Patient has not seen a physician  in many years and no recent labs to establish baseline.  On admission, creatinine up to 1.41 with a GFR of 55.  Patient was started on IV fluid hydration with improvement of creatinine to 1.10 at  time of discharge.  Recommend repeat BMP 1 week to ensure creatinine remains stable.   Hyperlipidemia, Aortic atherosclerosis- (present on admission) Total cholesterol 178, HDL 38, LDL 124, triglycerides 80.  CT renal stone study with incidental finding of aortic atherosclerosis.  Start atorvastatin 20 mg p.o. daily.  Ascending aorta dilatation (HCC)- (present on admission) TTE with noted ascending aortic dilation 40 mm. -- Will need continued outpatient follow-up/surveillance with PCP/cardiology       Discharge Diagnoses:  Principal Problem:   New onset atrial fibrillation (HCC) Active Problems:   Ureteral calculus, right   Renal dysfunction   Hyperlipidemia, Aortic atherosclerosis   Ascending aorta dilatation (HCC)   Atrial fibrillation with RVR (HCC)   Atrial flutter [facility])    Discharge Instructions  Discharge Instructions     Amb referral to AFIB Clinic   Complete by: As directed    Call MD for:  difficulty breathing, headache or visual disturbances   Complete by: As directed    Call MD for:  extreme fatigue   Complete by: As directed    Call MD for:  persistant dizziness or light-headedness   Complete by: As directed    Call MD for:  persistant nausea and vomiting   Complete by: As directed    Call MD for:  severe uncontrolled pain   Complete by: As directed    Call MD for:  temperature >100.4   Complete by: As directed    Diet - low sodium heart healthy   Complete by: As directed    Increase activity slowly   Complete by: As directed       Allergies as of 12/06/2021   No Known Allergies      Medication List     STOP taking these medications    aspirin EC 81 MG tablet       TAKE these medications    apixaban 5 MG Tabs tablet Commonly known as: ELIQUIS Take 1 tablet (5 mg total) by mouth 2 (two) times daily.   atorvastatin 20 MG tablet Commonly known as: LIPITOR Take 1 tablet (20 mg total) by mouth at bedtime.   metoprolol tartrate 50 MG  tablet Commonly known as: LOPRESSOR Take 1 tablet (50 mg total) by mouth 2 (two) times daily.   tamsulosin 0.4 MG Caps capsule Commonly known as: FLOMAX Take 1 capsule (0.4 mg total) by mouth daily after supper.        Follow-up Information     Shon Baton, MD. Schedule an appointment as soon as possible for a visit in 1 week(s).   Specialty: Internal Medicine Contact information: [place] [address] [phone]         Berniece Salines, DO. Schedule an appointment as soon as possible for a visit.   Specialty: Cardiology Contact information: [address] [place] [address] [phone]         [facility]. Schedule an appointment as soon as possible for a visit in 2 week(s).   Why: Make follow-up appointment in 2-3 weeks for kidney stone Contact information: [place] [phone]               No Known Allergies  Consultations: Cardiology   Procedures/Studies:  ECHOCARDIOGRAM COMPLETE  Result Date: 12/04/2021    ECHOCARDIOGRAM REPORT   Patient Name:   MAKOTO SELLITTO Date of Exam: 12/04/2021 Medical Rec #:  [id number]         Height:       69.0 in Accession #:    [id number]        Weight:       165.0 lb Date of Birth:  [birth year]         BSA:          1.904 m Patient Age:    66 years          BP:           134/84 mmHg Patient Gender: M                 HR:           76 bpm. Exam Location:  Inpatient Procedure: Cardiac Doppler, Color Doppler, 3D Echo and 2D Echo Indications:    I48.92* Unspecified atrial flutter  History:        Patient has no prior history of Echocardiogram examinations.                 Abnormal ECG; Arrythmias:Atrial Fibrillation and Atrial Flutter.  Sonographer:    Roseanna Rainbow RDCS Referring Phys: [id number] [place]  1. Left ventricular ejection fraction, by estimation, is 40 to 45%. The left ventricle has mildly decreased function. The left  ventricle has no regional wall motion abnormalities. There is mild left ventricular hypertrophy. Left ventricular diastolic function could not be evaluated.  2. Right ventricular systolic function is mildly reduced. The right ventricular size is normal.  3. Left atrial size was mild to moderately dilated.  4. Right atrial size was mild to moderately dilated.  5. The mitral valve is normal in structure. Trivial mitral valve regurgitation. No evidence of mitral stenosis.  6. The aortic valve is tricuspid. There is mild calcification of the aortic valve. Aortic valve regurgitation is not visualized. No aortic stenosis is present.  7. Aortic dilatation noted. There is mild dilatation of the ascending aorta, measuring 40 mm.  8. The inferior vena cava is normal in size with greater than 50% respiratory variability, suggesting right atrial pressure of 3 mmHg.  9. EF hard to assess in the setting of AFL. Appears 40-45%. FINDINGS  Left Ventricle: Left ventricular ejection fraction, by estimation, is 40 to 45%. The left ventricle has mildly decreased function. The left ventricle has no regional wall motion abnormalities. The left ventricular internal cavity size was normal in size. There is mild left ventricular hypertrophy. Left ventricular diastolic function could not be evaluated due to atrial fibrillation. Left ventricular diastolic function could not be evaluated. Right Ventricle: The right ventricular size is normal. No increase in right ventricular wall thickness. Right ventricular systolic function is mildly reduced. Left Atrium: Left atrial size was mild to moderately dilated. Right Atrium: Right atrial size was mild to moderately dilated. Pericardium: There is no evidence of pericardial effusion. Mitral Valve: The mitral valve is normal in structure. Trivial mitral valve regurgitation. No evidence of mitral valve stenosis. Tricuspid Valve: The tricuspid valve is normal in structure. Tricuspid valve regurgitation is  trivial. No evidence of tricuspid stenosis. Aortic Valve: The aortic valve is tricuspid. There is mild calcification of the aortic valve. Aortic valve regurgitation is not visualized. No aortic stenosis is present. Pulmonic Valve: The pulmonic valve was grossly normal. Pulmonic valve regurgitation is not visualized. No evidence  of pulmonic stenosis. Aorta: The aortic root is normal in size and structure and aortic dilatation noted. There is mild dilatation of the ascending aorta, measuring 40 mm. Venous: The inferior vena cava is normal in size with greater than 50% respiratory variability, suggesting right atrial pressure of 3 mmHg. IAS/Shunts: No atrial level shunt detected by color flow Doppler.  LEFT VENTRICLE PLAX 2D LVIDd:         4.30 cm LVIDs:         3.00 cm LV PW:         1.10 cm LV IVS:        1.50 cm LVOT diam:     2.20 cm LV SV:         75 LV SV Index:   40 LVOT Area:     3.80 cm  LV Volumes (MOD) LV vol d, MOD A2C: 60.9 ml LV vol d, MOD A4C: 82.0 ml LV vol s, MOD A2C: 29.4 ml LV vol s, MOD A4C: 42.6 ml LV SV MOD A2C:     31.5 ml LV SV MOD A4C:     82.0 ml LV SV MOD BP:      35.5 ml RIGHT VENTRICLE             IVC RV S prime:     11.70 cm/s  IVC diam: 1.70 cm TAPSE (M-mode): 1.8 cm LEFT ATRIUM             Index        RIGHT ATRIUM           Index LA diam:        3.80 cm 2.00 cm/m   RA Area:     14.20 cm LA Vol (A2C):   30.5 ml 16.02 ml/m  RA Volume:   31.20 ml  16.39 ml/m LA Vol (A4C):   28.2 ml 14.81 ml/m LA Biplane Vol: 31.4 ml 16.49 ml/m  AORTIC VALVE LVOT Vmax:   117.00 cm/s LVOT Vmean:  82.300 cm/s LVOT VTI:    0.198 m  AORTA Ao Root diam: 3.40 cm Ao Asc diam:  4.00 cm MITRAL VALVE MV Area (PHT): 3.28 cm    SHUNTS MV Decel Time: 232 msec    Systemic VTI:  0.20 m MV E velocity: 78.60 cm/s  Systemic Diam: 2.20 cm MV A velocity: 64.98 cm/s MV E/A ratio:  1.21 Glori Bickers MD Electronically signed by Glori Bickers MD Signature Date/Time: 12/04/2021/7:05:01 PM    Final    CT Renal Stone  Study  Result Date: 12/04/2021 CLINICAL DATA:  Flank pain.  Rule out kidney stone. EXAM: CT ABDOMEN AND PELVIS WITHOUT CONTRAST TECHNIQUE: Multidetector CT imaging of the abdomen and pelvis was performed following the standard protocol without IV contrast. RADIATION DOSE REDUCTION: This exam was performed according to the departmental dose-optimization program which includes automated exposure control, adjustment of the mA and/or kV according to patient size and/or use of iterative reconstruction technique. COMPARISON:  None. FINDINGS: Lower chest: No acute abnormality. Hepatobiliary: No focal liver abnormality.  Gallbladder normal. Pancreas: Unremarkable. No pancreatic ductal dilatation or surrounding inflammatory changes. Spleen: Normal in size without focal abnormality. Adrenals/Urinary Tract: Normal adrenal glands. Upper pole right renal calculus measures 4 mm. No left renal calculi. There is right-sided perinephric fat stranding and hydronephrosis with hydroureter. Within the proximal right ureter there is a stone measuring 4 mm, image 78/6 and image 42/3. No left-sided hydronephrosis or hydroureter. Urinary bladder is unremarkable. Stomach/Bowel: Stomach is within normal  limits. Status post appendectomy. No evidence of bowel wall thickening, distention, or inflammatory changes. Vascular/Lymphatic: Mild aortic atherosclerosis. No aneurysm. No abdominopelvic adenopathy. Reproductive: Prostate is unremarkable. Partially visualized small left hydrocele. Other: No free fluid or fluid collections identified. Musculoskeletal: Degenerative disc disease is noted at L5-S1. Chronic superior endplate deformity involving the L3 vertebral body is unchanged from 2016. IMPRESSION: 1. Right-sided hydronephrosis and hydroureter secondary to 4 mm proximal right ureteral calculus. 2. Aortic Atherosclerosis (ICD10-I70.0). Electronically Signed   By: Kerby Moors M.D.   On: 12/04/2021 07:21   ECHO TEE  Result Date:  12/06/2021    TRANSESOPHOGEAL ECHO REPORT   Patient Name:   Arthur Barrett Date of Exam: 12/06/2021 Medical Rec #:  [id number]         Height:       69.0 in Accession #:    [id number]        Weight:       176.7 lb Date of Birth:  [birth year]         BSA:          1.960 m Patient Age:    38 years          BP:           122/105 mmHg Patient Gender: M                 HR:           140 bpm. Exam Location:  Inpatient Procedure: Transesophageal Echo, Color Doppler and Cardiac Doppler Indications:     I48.92* Unspecified atrial flutter  History:         Patient has prior history of Echocardiogram examinations, most                  recent 12/04/2021. Arrythmias:Atrial Fibrillation; Risk                  Factors:Dyslipidemia.  Sonographer:     Raquel Sarna Senior RDCS Referring Phys:  [id number] Nadean Corwin HILTY Diagnosing Phys: Lyman Bishop MD PROCEDURE: After discussion of the risks and benefits of a TEE, an informed consent was obtained from the patient. The transesophogeal probe was passed without difficulty through the esophogus of the patient. Sedation performed by different physician. The patient was monitored while under deep sedation. Anesthestetic sedation was provided intravenously by Anesthesiology: 130mg  of Propofol. The patient developed no complications during the procedure. A successful direct current cardioversion was performed at 120 joules with 1 attempt. IMPRESSIONS  1. Left ventricular ejection fraction, by estimation, is 50 to 55%. The left ventricle has low normal function.  2. Right ventricular systolic function is normal. The right ventricular size is normal.  3. Left atrial size was moderately dilated. No left atrial/left atrial appendage thrombus was detected.  4. The mitral valve is grossly normal. Trivial mitral valve regurgitation.  5. The aortic valve is tricuspid. Aortic valve regurgitation is not visualized.  6. Aortic dilatation noted. There is borderline dilatation of the ascending aorta, measuring 38  mm. Conclusion(s)/Recommendation(s): No LA/LAA thrombus identified. Successful cardioversion performed with restoration of normal sinus rhythm. FINDINGS  Left Ventricle: Left ventricular ejection fraction, by estimation, is 50 to 55%. The left ventricle has low normal function. The left ventricular internal cavity size was normal in size. There is no left ventricular hypertrophy. Right Ventricle: The right ventricular size is normal. No increase in right ventricular wall thickness. Right ventricular systolic function is normal. Left Atrium: Left atrial size was moderately dilated. No left  atrial/left atrial appendage thrombus was detected. Right Atrium: Right atrial size was normal in size. Pericardium: There is no evidence of pericardial effusion. Mitral Valve: The mitral valve is grossly normal. Trivial mitral valve regurgitation. Tricuspid Valve: The tricuspid valve is grossly normal. Tricuspid valve regurgitation is trivial. Aortic Valve: The aortic valve is tricuspid. Aortic valve regurgitation is not visualized. Pulmonic Valve: The pulmonic valve was normal in structure. Pulmonic valve regurgitation is not visualized. Aorta: Aortic dilatation noted. There is borderline dilatation of the ascending aorta, measuring 38 mm. IAS/Shunts: No atrial level shunt detected by color flow Doppler.   LV Volumes (MOD) LV vol d, MOD A4C: 80.3 ml LV vol s, MOD A4C: 38.5 ml LV SV MOD A4C:     80.3 ml Lyman Bishop MD Electronically signed by Lyman Bishop MD Signature Date/Time: 12/06/2021/3:19:14 PM    Final      Subjective: Patient seen examined bedside, resting comfortably.  Underwent successful cardioversion today back to normal sinus rhythm.  Okay for discharge home per cardiology with outpatient follow-up.  Patient with no other questions or concerns at this time.  Denies headache, no fever/chills/night sweats, no nausea/vomiting/diarrhea, no chest pain, palpitations, no shortness of breath, no abdominal pain, no  weakness, no fatigue, no paresthesias.  No acute events overnight per nursing staff.  Discharge Exam: Vitals:   12/06/21 1512 12/06/21 1522  BP: 117/84 128/73  Pulse: 88 87  Resp: 20 18  Temp:    SpO2: 93% 94%   Vitals:   12/06/21 1313 12/06/21 1502 12/06/21 1512 12/06/21 1522  BP: (!) 150/106 93/75 117/84 128/73  Pulse: (!) 143 89 88 87  Resp: 20 19 20 18   Temp: 97.9 F (36.6 C) (!) 97.4 F (36.3 C)    TempSrc: Temporal Temporal    SpO2: 95% 98% 93% 94%  Weight:      Height:        Physical Exam: GEN: NAD, alert and oriented x 3, wd/wn HEENT: NCAT, PERRL, EOMI, sclera clear, MMM PULM: CTAB w/o wheezes/crackles, normal respiratory effort CV: RRR w/o M/G/R GI: abd soft, NTND, NABS, no R/G/M MSK: no peripheral edema, muscle strength globally intact 5/5 bilateral upper/lower extremities NEURO: CN II-XII intact, no focal deficits, sensation to light touch intact PSYCH: normal mood/affect Integumentary: dry/intact, no rashes or wounds    The results of significant diagnostics from this hospitalization (including imaging, microbiology, ancillary and laboratory) are listed below for reference.     Microbiology: No results found for this or any previous visit (from the past 240 hour(s)).   Labs: BNP (last 3 results) No results for input(s): BNP in the last 8760 hours. Basic Metabolic Panel: Recent Labs  Lab 12/04/21 0552 12/04/21 0851 12/05/21 0331 12/06/21 0304  NA 139  --  140 139  K 3.5  --  3.7 3.9  CL 105  --  107 105  CO2 23  --  26 26  GLUCOSE 138*  --  108* 108*  BUN 15  --  16 15  CREATININE 1.41*  --  1.24 1.10  CALCIUM 8.6*  --  8.4* 9.1  MG  --  2.0  --  2.0   Liver Function Tests: Recent Labs  Lab 12/04/21 0552  AST 26  ALT 25  ALKPHOS 46  BILITOT 0.8  PROT 6.7  ALBUMIN 3.8   No results for input(s): LIPASE, AMYLASE in the last 168 hours. No results for input(s): AMMONIA in the last 168 hours. CBC: Recent Labs  Lab 12/04/21  8250  12/05/21 0331  WBC 9.0 6.5  NEUTROABS 7.0  --   HGB 16.0 14.3  HCT 45.7 40.3  MCV 92.3 91.2  PLT 197 182   Cardiac Enzymes: No results for input(s): CKTOTAL, CKMB, CKMBINDEX, TROPONINI in the last 168 hours. BNP: Invalid input(s): POCBNP CBG: No results for input(s): GLUCAP in the last 168 hours. D-Dimer No results for input(s): DDIMER in the last 72 hours. Hgb A1c Recent Labs    12/04/21 1901  HGBA1C 5.1   Lipid Profile Recent Labs    12/05/21 0331  CHOL 178  HDL 38*  LDLCALC 124*  TRIG 80  CHOLHDL 4.7   Thyroid function studies Recent Labs    12/04/21 0851  TSH 1.355   Anemia work up No results for input(s): VITAMINB12, FOLATE, FERRITIN, TIBC, IRON, RETICCTPCT in the last 72 hours. Urinalysis    Component Value Date/Time   COLORURINE YELLOW 12/04/2021 [place] 12/04/2021 0855   LABSPEC 1.016 12/04/2021 0855   PHURINE 5.0 12/04/2021 0855   GLUCOSEU NEGATIVE 12/04/2021 0855   HGBUR MODERATE (A) 12/04/2021 0855   BILIRUBINUR NEGATIVE 12/04/2021 0855   KETONESUR NEGATIVE 12/04/2021 0855   PROTEINUR NEGATIVE 12/04/2021 0855   NITRITE NEGATIVE 12/04/2021 0855   LEUKOCYTESUR NEGATIVE 12/04/2021 0855   Sepsis Labs Invalid input(s): PROCALCITONIN,  WBC,  LACTICIDVEN Microbiology No results found for this or any previous visit (from the past 240 hour(s)).   Time coordinating discharge: Over 30 minutes  SIGNED:   Donnamarie Poag [place] DO  [facility] 12/06/2021, 4:23 PM.

## 2021-12-06 NOTE — Interval H&P Note (Signed)
History and Physical Interval Note:  12/06/2021 1:33 PM  Arthur Barrett  has presented today for surgery, with the diagnosis of afib.  The various methods of treatment have been discussed with the patient and family. After consideration of risks, benefits and other options for treatment, the patient has consented to  Procedure(s): TRANSESOPHAGEAL ECHOCARDIOGRAM (TEE) (N/A) CARDIOVERSION (N/A) as a surgical intervention.  The patient's history has been reviewed, patient examined, no change in status, stable for surgery.  I have reviewed the patient's chart and labs.  Questions were answered to the patient's satisfaction.     Pixie Casino

## 2021-12-06 NOTE — Anesthesia Preprocedure Evaluation (Signed)
Anesthesia Evaluation  Patient identified by MRN, date of birth, ID band Patient awake    Reviewed: Allergy & Precautions, NPO status , Patient's Chart, lab work & pertinent test results  Airway Mallampati: II       Dental   Pulmonary neg pulmonary ROS,    breath sounds clear to auscultation       Cardiovascular + dysrhythmias Atrial Fibrillation (-) Valvular Problems/Murmurs Rhythm:Regular Rate:Normal     Neuro/Psych    GI/Hepatic negative GI ROS, Neg liver ROS,   Endo/Other    Renal/GU negative Renal ROS     Musculoskeletal   Abdominal   Peds  Hematology   Anesthesia Other Findings   Reproductive/Obstetrics                             Anesthesia Physical Anesthesia Plan  ASA: 3  Anesthesia Plan: MAC   Post-op Pain Management:    Induction:   PONV Risk Score and Plan: Propofol infusion  Airway Management Planned: Simple Face Mask  Additional Equipment:   Intra-op Plan:   Post-operative Plan:   Informed Consent: I have reviewed the patients History and Physical, chart, labs and discussed the procedure including the risks, benefits and alternatives for the proposed anesthesia with the patient or authorized representative who has indicated his/her understanding and acceptance.     Dental advisory given  Plan Discussed with: Anesthesiologist and CRNA  Anesthesia Plan Comments:         Anesthesia Quick Evaluation

## 2021-12-06 NOTE — Anesthesia Postprocedure Evaluation (Signed)
Anesthesia Post Note  Patient: Arthur Barrett  Procedure(s) Performed: TRANSESOPHAGEAL ECHOCARDIOGRAM (TEE) CARDIOVERSION     Patient location during evaluation: Endoscopy Anesthesia Type: MAC Level of consciousness: awake Pain management: pain level controlled Respiratory status: spontaneous breathing Cardiovascular status: stable Postop Assessment: no apparent nausea or vomiting Anesthetic complications: no   No notable events documented.  Last Vitals:  Vitals:   12/06/21 0804 12/06/21 1313  BP: 120/86 (!) 150/106  Pulse: (!) 140 (!) 143  Resp: 17 20  Temp:  36.6 C  SpO2: 98% 95%    Last Pain:  Vitals:   12/06/21 1313  TempSrc: Temporal  PainSc: 0-No pain                 Angline Schweigert

## 2021-12-06 NOTE — Transfer of Care (Signed)
Immediate Anesthesia Transfer of Care Note  Patient: Arthur Barrett  Procedure(s) Performed: TRANSESOPHAGEAL ECHOCARDIOGRAM (TEE) CARDIOVERSION  Patient Location: Endoscopy Unit  Anesthesia Type:MAC  Level of Consciousness: awake, alert  and oriented  Airway & Oxygen Therapy: Patient Spontanous Breathing and Patient connected to nasal cannula oxygen  Post-op Assessment: Report given to RN and Post -op Vital signs reviewed and stable  Post vital signs: Reviewed and stable  Last Vitals:  Vitals Value Taken Time  BP 94/79 12/06/21 1504  Temp    Pulse 87 12/06/21 1505  Resp 12 12/06/21 1505  SpO2 97 % 12/06/21 1505  Vitals shown include unvalidated device data.  Last Pain:  Vitals:   12/06/21 1502  TempSrc:   PainSc: 0-No pain      Patients Stated Pain Goal: 0 (14/27/67 0110)  Complications: No notable events documented.

## 2021-12-06 NOTE — Anesthesia Postprocedure Evaluation (Signed)
Anesthesia Post Note  Patient: Arthur Barrett  Procedure(s) Performed: TRANSESOPHAGEAL ECHOCARDIOGRAM (TEE) CARDIOVERSION     Patient location during evaluation: Endoscopy Anesthesia Type: MAC Level of consciousness: awake Pain management: pain level controlled Vital Signs Assessment: post-procedure vital signs reviewed and stable Respiratory status: spontaneous breathing Cardiovascular status: stable Postop Assessment: no apparent nausea or vomiting Anesthetic complications: no   No notable events documented.  Last Vitals:  Vitals:   12/06/21 1512 12/06/21 1522  BP: 117/84 128/73  Pulse: 88 87  Resp: 20 18  Temp:    SpO2: 93% 94%    Last Pain:  Vitals:   12/06/21 1522  TempSrc:   PainSc: 0-No pain                 Mishayla Sliwinski

## 2021-12-06 NOTE — Plan of Care (Signed)
°  Problem: Education: Goal: Knowledge of disease or condition will improve Outcome: Adequate for Discharge Goal: Understanding of medication regimen will improve Outcome: Adequate for Discharge Goal: Individualized Educational Video(s) Outcome: Adequate for Discharge

## 2021-12-08 ENCOUNTER — Encounter (HOSPITAL_COMMUNITY): Payer: Self-pay | Admitting: Internal Medicine

## 2021-12-11 ENCOUNTER — Other Ambulatory Visit (HOSPITAL_COMMUNITY): Payer: Self-pay

## 2021-12-11 ENCOUNTER — Telehealth (HOSPITAL_COMMUNITY): Payer: Self-pay

## 2021-12-11 NOTE — Telephone Encounter (Signed)
Pharmacy Transitions of Care Follow-up Telephone Call  Date of discharge: 12/06/21  Discharge Diagnosis: AFIB  How have you been since you were released from the hospital? Patient is doing well, finally passed his kidney stone and is feeling much better. Also his heart rate has been better via measurement on his apple watch. Meds have been transferred to [facility] order and payment information was obtained.   Medication changes made at discharge: START taking these medications  START taking these medications  atorvastatin 20 MG tablet Commonly known as: LIPITOR Take 1 tablet (20 mg total) by mouth at bedtime.  Eliquis 5 MG Tabs tablet Generic drug: apixaban Take 1 tablet (5 mg total) by mouth 2 (two) times daily.  metoprolol tartrate 50 MG tablet Commonly known as: LOPRESSOR Take 1 tablet (50 mg total) by mouth 2 (two) times daily.  tamsulosin 0.4 MG Caps capsule Commonly known as: FLOMAX Take 1 capsule (0.4 mg total) by mouth daily after supper.   STOP taking these medications  STOP taking these medications  aspirin EC 81 MG tablet    Medication changes verified by the patient? Y- daughter is a Software engineer and went over all medications    Medication Accessibility:  Home Pharmacy: [facility] (but switching to [facility])  Was the patient provided with refills on discharged medications? Yes   Have all prescriptions been transferred from [facility] to home pharmacy? Y- [facility] mail   Is the patient able to afford medications? y    Medication Review: APIXABAN (ELIQUIS)  Apixaban 5 mg BID initiated on 12/04/21.  - Discussed importance of taking medication around the same time everyday  - Reviewed potential DDIs with patient  - Advised patient of medications to avoid (NSAIDs, ASA)  - Educated that Tylenol (acetaminophen) will be the preferred analgesic to prevent risk of bleeding  - Emphasized importance of monitoring for signs and symptoms of bleeding (abnormal bruising,  prolonged bleeding, nose bleeds, bleeding from gums, discolored urine, black tarry stools)  - Advised patient to alert all providers of anticoagulation therapy prior to starting a new medication or having a procedure    Follow-up Appointments:  12/18/2021 11:40 AM OFFICE VISIT 20 min [place] [[id number]]    Final Patient Assessment: Patient is doing well, finally passed his kidney stone and is feeling much better. Also his heart rate has been better via measurement on his apple watch. Meds have been transferred to [facility] order and payment information was obtained.

## 2021-12-13 ENCOUNTER — Other Ambulatory Visit (HOSPITAL_COMMUNITY): Payer: Self-pay

## 2021-12-13 ENCOUNTER — Telehealth: Payer: Self-pay | Admitting: Cardiology

## 2021-12-13 NOTE — Telephone Encounter (Signed)
° °  Patient c/o Palpitations:  High priority if patient c/o lightheadedness, shortness of breath, or chest pain  How long have you had palpitations/irregular HR/ Afib? Are you having the symptoms now? Yes   Are you currently experiencing lightheadedness, SOB or CP? None   Do you have a history of afib (atrial fibrillation) or irregular heart rhythm?   Have you checked your BP or HR? (document readings if available): upper 140s  Are you experiencing any other symptoms?  Pt said this morning when he bent down to tie his shoes his fitbit alerted him that his HR went up to 140s, he was having 50s bpm and jump to 140s in an instant until not his HR is not going down

## 2021-12-13 NOTE — Telephone Encounter (Signed)
Patient stated his heart rate has been in the 130s - 140s. He said after he bent down to tie shoes this morning, he felt something tug in his chest and his pulse has been 130's for 2 hours. His fitbit alarmed him. He denies chest pain, sob, or light-headedness. He is taking medications as prescribed. He felt his pulse and noticed skipping beats. Recommended to patient that if her starts to feel faint or has chest pain to call 911. He voiced understanding of this conversation.

## 2021-12-18 ENCOUNTER — Other Ambulatory Visit (HOSPITAL_COMMUNITY): Payer: Self-pay

## 2021-12-18 ENCOUNTER — Other Ambulatory Visit: Payer: Self-pay

## 2021-12-18 ENCOUNTER — Ambulatory Visit (INDEPENDENT_AMBULATORY_CARE_PROVIDER_SITE_OTHER): Payer: PPO | Admitting: Cardiology

## 2021-12-18 ENCOUNTER — Encounter: Payer: Self-pay | Admitting: Cardiology

## 2021-12-18 VITALS — BP 122/80 | HR 52 | Ht 69.0 in | Wt 178.4 lb

## 2021-12-18 DIAGNOSIS — N201 Calculus of ureter: Secondary | ICD-10-CM | POA: Diagnosis not present

## 2021-12-18 DIAGNOSIS — I4891 Unspecified atrial fibrillation: Secondary | ICD-10-CM | POA: Diagnosis not present

## 2021-12-18 DIAGNOSIS — N403 Nodular prostate with lower urinary tract symptoms: Secondary | ICD-10-CM | POA: Diagnosis not present

## 2021-12-18 DIAGNOSIS — R3915 Urgency of urination: Secondary | ICD-10-CM | POA: Diagnosis not present

## 2021-12-18 MED ORDER — FLECAINIDE ACETATE 50 MG PO TABS
50.0000 mg | ORAL_TABLET | Freq: Two times a day (BID) | ORAL | 3 refills | Status: DC
  Filled 2021-12-18: qty 180, 90d supply, fill #0

## 2021-12-18 MED ORDER — MYRBETRIQ 25 MG PO TB24
25.0000 mg | ORAL_TABLET | Freq: Every day | ORAL | 3 refills | Status: DC
  Filled 2021-12-18 (×2): qty 90, 90d supply, fill #0
  Filled 2022-03-28: qty 90, 90d supply, fill #1
  Filled 2022-06-24: qty 90, 90d supply, fill #2
  Filled 2022-09-25: qty 90, 90d supply, fill #3

## 2021-12-18 MED ORDER — METOPROLOL TARTRATE 25 MG PO TABS
25.0000 mg | ORAL_TABLET | Freq: Two times a day (BID) | ORAL | 3 refills | Status: DC
  Filled 2021-12-18: qty 180, 90d supply, fill #0

## 2021-12-18 NOTE — Progress Notes (Signed)
Cardiology Office Note:    Date:  12/18/2021   ID:  Arthur Barrett, DOB 1955-11-04, MRN [id number]  PCP:  Shon Baton, MD  Cardiologist:  Berniece Salines, DO  Electrophysiologist:  None   Referring MD: Shon Baton, MD  "I am doing fine"  History of Present Illness:    Arthur Barrett is a 66 y.o. male with a hx of paroxysmal atrial fibrillation status post TEE cardioversion while he was in the hospital, now on metoprolol as well as Eliquis, presents for his hospital follow-up.  His recent hospitalization for kidney stone was complicated by his new diagnosis of atrial fibrillation with rapid ventricular rate.  He was successfully cardioverted and converted to sinus rhythm.  Can continue Eliquis and metoprolol.  He is here today for follow-up with his wife.  He tells me since he left the hospital he has had episodes of intermittent A-fib.  They have been short lasting but is happening.  He did feel symptomatic with fatigue during this time.  Past Medical History:  Diagnosis Date   Atrial fibrillation [facility])     Past Surgical History:  Procedure Laterality Date   APPENDECTOMY     CARDIOVERSION N/A 12/06/2021   Procedure: CARDIOVERSION;  Surgeon: Pixie Casino, MD;  Location: [facility] ENDOSCOPY;  Service: Cardiovascular;  Laterality: N/A;   colonscopy      LUMBAR LAMINECTOMY/DECOMPRESSION MICRODISCECTOMY Left 01/31/2015   Procedure: MICRO LUMBAR DECOMPRESSION LUMBAR FIVE TO SACRAL ONE ON LEFT;  Surgeon: Susa Day, MD;  Location: [facility] ORS;  Service: Orthopedics;  Laterality: Left;   TEE WITHOUT CARDIOVERSION N/A 12/06/2021   Procedure: TRANSESOPHAGEAL ECHOCARDIOGRAM (TEE);  Surgeon: Pixie Casino, MD;  Location: [facility] ENDOSCOPY;  Service: Cardiovascular;  Laterality: N/A;    Current Medications: Current Meds  Medication Sig   apixaban (ELIQUIS) 5 MG TABS tablet Take 1 tablet (5 mg total) by mouth 2 (two) times daily.   atorvastatin (LIPITOR) 20 MG tablet Take 1 tablet (20 mg total) by  mouth at bedtime.   flecainide (TAMBOCOR) 50 MG tablet Take 1 tablet (50 mg total) by mouth 2 (two) times daily.   metoprolol tartrate (LOPRESSOR) 25 MG tablet Take 1 tablet (25 mg total) by mouth 2 (two) times daily.   mirabegron ER (MYRBETRIQ) 25 MG TB24 tablet Take 1 tablet (25 mg total) by mouth daily.   tamsulosin (FLOMAX) 0.4 MG CAPS capsule Take 1 capsule (0.4 mg total) by mouth daily after supper.   [DISCONTINUED] metoprolol tartrate (LOPRESSOR) 50 MG tablet Take 1 tablet (50 mg total) by mouth 2 (two) times daily.     Allergies:   Patient has no known allergies.   Social History   Socioeconomic History   Marital status: Married    Spouse name: Not on file   Number of children: Not on file   Years of education: Not on file   Highest education level: Not on file  Occupational History   Occupation: retired  Tobacco Use   Smoking status: Never   Smokeless tobacco: Never  Substance and Sexual Activity   Alcohol use: No   Drug use: No   Sexual activity: Not on file  Other Topics Concern   Not on file  Social History Narrative   Not on file   Social Determinants of Health   Financial Resource Strain: Not on file  Food Insecurity: Not on file  Transportation Needs: Not on file  Physical Activity: Not on file  Stress: Not on file  Social Connections: Not on file  Family History: The patient's family history is not on file.  ROS:   Review of Systems  Constitution: Negative for decreased appetite, fever and weight gain.  HENT: Negative for congestion, ear discharge, hoarse voice and sore throat.   Eyes: Negative for discharge, redness, vision loss in right eye and visual halos.  Cardiovascular: Negative for chest pain, dyspnea on exertion, leg swelling, orthopnea and palpitations.  Respiratory: Negative for cough, hemoptysis, shortness of breath and snoring.   Endocrine: Negative for heat intolerance and polyphagia.  Hematologic/Lymphatic: Negative for bleeding  problem. Does not bruise/bleed easily.  Skin: Negative for flushing, nail changes, rash and suspicious lesions.  Musculoskeletal: Negative for arthritis, joint pain, muscle cramps, myalgias, neck pain and stiffness.  Gastrointestinal: Negative for abdominal pain, bowel incontinence, diarrhea and excessive appetite.  Genitourinary: Negative for decreased libido, genital sores and incomplete emptying.  Neurological: Negative for brief paralysis, focal weakness, headaches and loss of balance.  Psychiatric/Behavioral: Negative for altered mental status, depression and suicidal ideas.  Allergic/Immunologic: Negative for HIV exposure and persistent infections.    EKGs/Labs/Other Studies Reviewed:    The following studies were reviewed today:   EKG:  The ekg ordered today demonstrates   Recent Labs: 12/04/2021: ALT 25; TSH 1.355 12/05/2021: Hemoglobin 14.3; Platelets 182 12/06/2021: BUN 15; Creatinine, Ser 1.10; Magnesium 2.0; Potassium 3.9; Sodium 139  Recent Lipid Panel    Component Value Date/Time   CHOL 178 12/05/2021 0331   TRIG 80 12/05/2021 0331   HDL 38 (L) 12/05/2021 0331   CHOLHDL 4.7 12/05/2021 0331   VLDL 16 12/05/2021 0331   LDLCALC 124 (H) 12/05/2021 0331    Physical Exam:    VS:  BP 122/80 (BP Location: Left Arm, Patient Position: Sitting)    Pulse (!) 52    Ht 5\' 9"  (1.753 m)    Wt 178 lb 6.4 oz (80.9 kg)    SpO2 99%    BMI 26.35 kg/m     Wt Readings from Last 3 Encounters:  12/18/21 178 lb 6.4 oz (80.9 kg)  12/05/21 176 lb 11.2 oz (80.2 kg)  01/31/15 165 lb (74.8 kg)     GEN: Well nourished, well developed in no acute distress HEENT: Normal NECK: No JVD; No carotid bruits LYMPHATICS: No lymphadenopathy CARDIAC: S1S2 noted,RRR, no murmurs, rubs, gallops RESPIRATORY:  Clear to auscultation without rales, wheezing or rhonchi  ABDOMEN: Soft, non-tender, non-distended, +bowel sounds, no guarding. EXTREMITIES: No edema, No cyanosis, no clubbing MUSCULOSKELETAL:  No  deformity  SKIN: Warm and dry NEUROLOGIC:  Alert and oriented x 3, non-focal PSYCHIATRIC:  Normal affect, good insight  ASSESSMENT:    1. Atrial fibrillation, unspecified type ([place])    PLAN:     He is in sinus rhythm today.  I reviewed his [place] mobile which show transient episodes of atrial fibrillation.  He is currently on Lopressor 50 mg twice daily.  He is bradycardic in office today.  I like to cut back on his Lopressor to 25 mg twice daily.  We will start flecainide 50 mg twice daily.  In the meantime I am going to make sure to get an Lexiscan to rule out any obstructive coronary disease.  Shared Decision Making/Informed Consent The risks [chest pain, shortness of breath, cardiac arrhythmias, dizziness, blood pressure fluctuations, myocardial infarction, stroke/transient ischemic attack, nausea, vomiting, allergic reaction, radiation exposure, metallic taste sensation and life-threatening complications (estimated to be 1 in 10,000)], benefits (risk stratification, diagnosing coronary artery disease, treatment guidance) and alternatives of a nuclear stress  test were discussed in detail with Mr. Ruz and he agrees to proceed.   The patient is in agreement with the above plan. The patient left the office in stable condition.  The patient will follow up in 4 months or sooner if needed.   Medication Adjustments/Labs and Tests Ordered: Current medicines are reviewed at length with the patient today.  Concerns regarding medicines are outlined above.  Orders Placed This Encounter  Procedures   MYOCARDIAL PERFUSION IMAGING   EKG 12-Lead   Meds ordered this encounter  Medications   flecainide (TAMBOCOR) 50 MG tablet    Sig: Take 1 tablet (50 mg total) by mouth 2 (two) times daily.    Dispense:  180 tablet    Refill:  3   metoprolol tartrate (LOPRESSOR) 25 MG tablet    Sig: Take 1 tablet (25 mg total) by mouth 2 (two) times daily.    Dispense:  180 tablet    Refill:  3     Patient Instructions  Medication Instructions:  Your physician has recommended you make the following change in your medication:  START: Flecainide 50 mg twice daily DECREASE: Lopressor (metoprolol tartrate) 25 mg once daily  *If you need a refill on your cardiac medications before your next appointment, please call your pharmacy*   Lab Work: None If you have labs (blood work) drawn today and your tests are completely normal, you will receive your results only by: [place] (if you have MyChart) OR A paper copy in the mail If you have any lab test that is abnormal or we need to change your treatment, we will call you to review the results.   Testing/Procedures: Your physician has requested that you have a lexiscan myoview. For further information please visit [email or web address]. Please follow instruction sheet, as given.   The test will take approximately 3 to 4 hours to complete; you may bring reading material.  If someone comes with you to your appointment, they will need to remain in the main lobby due to limited space in the testing area. **If you are pregnant or breastfeeding, please notify the nuclear lab prior to your appointment**  How to prepare for your Myocardial Perfusion Test: Do not eat or drink 3 hours prior to your test, except you may have water. Do not consume products containing caffeine (regular or decaffeinated) 12 hours prior to your test. (ex: coffee, chocolate, sodas, tea). Do bring a list of your current medications with you.  If not listed below, you may take your medications as normal. Do wear comfortable clothes (no dresses or overalls) and walking shoes, tennis shoes preferred (No heels or open toe shoes are allowed). Do NOT wear cologne, perfume, aftershave, or lotions (deodorant is allowed). If these instructions are not followed, your test will have to be rescheduled.    Follow-Up: At [facility], you and your health needs are our  priority.  As part of our continuing mission to provide you with exceptional heart care, we have created designated Provider Care Teams.  These Care Teams include your primary Cardiologist (physician) and Advanced Practice Providers (APPs -  Physician Assistants and Nurse Practitioners) who all work together to provide you with the care you need, when you need it.  We recommend signing up for the patient portal called "MyChart".  Sign up information is provided on this After Visit Summary.  MyChart is used to connect with patients for Virtual Visits (Telemedicine).  Patients are able to view lab/test results, encounter notes, upcoming  appointments, etc.  Non-urgent messages can be sent to your provider as well.   To learn more about what you can do with MyChart, go to [email or web address].    Your next appointment:   4 month(s)  The format for your next appointment:   In Person  Provider:   Berniece Salines, DO     Other Instructions     Adopting a Healthy Lifestyle.  Know what a healthy weight is for you (roughly BMI <25) and aim to maintain this   Aim for 7+ servings of fruits and vegetables daily   65-80+ fluid ounces of water or unsweet tea for healthy kidneys   Limit to max 1 drink of alcohol per day; avoid smoking/tobacco   Limit animal fats in diet for cholesterol and heart health - choose grass fed whenever available   Avoid highly processed foods, and foods high in saturated/trans fats   Aim for low stress - take time to unwind and care for your mental health   Aim for 150 min of moderate intensity exercise weekly for heart health, and weights twice weekly for bone health   Aim for 7-9 hours of sleep daily   When it comes to diets, agreement about the perfect plan isnt easy to find, even among the experts. Experts at the [employer] developed an idea known as the Healthy Eating Plate. Just imagine a plate divided into logical, healthy portions.    The emphasis is on diet quality:   Load up on vegetables and fruits - one-half of your plate: Aim for color and variety, and remember that potatoes dont count.   Go for whole grains - one-quarter of your plate: Whole wheat, barley, wheat berries, quinoa, oats, brown rice, and foods made with them. If you want pasta, go with whole wheat pasta.   Protein power - one-quarter of your plate: Fish, chicken, beans, and nuts are all healthy, versatile protein sources. Limit red meat.   The diet, however, does go beyond the plate, offering a few other suggestions.   Use healthy plant oils, such as olive, canola, soy, corn, sunflower and peanut. Check the labels, and avoid partially hydrogenated oil, which have unhealthy trans fats.   If youre thirsty, drink water. Coffee and tea are good in moderation, but skip sugary drinks and limit milk and dairy products to one or two daily servings.   The type of carbohydrate in the diet is more important than the amount. Some sources of carbohydrates, such as vegetables, fruits, whole grains, and beans-are healthier than others.   Finally, stay active  Signed, Berniece Salines, DO  12/18/2021 8:23 PM    [place]

## 2021-12-18 NOTE — Patient Instructions (Signed)
Medication Instructions:  ?Your physician has recommended you make the following change in your medication:  ?START: Flecainide 50 mg twice daily ?DECREASE: Lopressor (metoprolol tartrate) 25 mg once daily  ?*If you need a refill on your cardiac medications before your next appointment, please call your pharmacy* ? ? ?Lab Work: ?None ?If you have labs (blood work) drawn today and your tests are completely normal, you will receive your results only by: ?MyChart Message (if you have MyChart) OR ?A paper copy in the mail ?If you have any lab test that is abnormal or we need to change your treatment, we will call you to review the results. ? ? ?Testing/Procedures: ?Your physician has requested that you have a lexiscan myoview. For further information please visit [email or web address]. Please follow instruction sheet, as given. ? ? ?The test will take approximately 3 to 4 hours to complete; you may bring reading material.  If someone comes with you to your appointment, they will need to remain in the main lobby due to limited space in the testing area. **If you are pregnant or breastfeeding, please notify the nuclear lab prior to your appointment** ? ?How to prepare for your Myocardial Perfusion Test: ?Do not eat or drink 3 hours prior to your test, except you may have water. ?Do not consume products containing caffeine (regular or decaffeinated) 12 hours prior to your test. (ex: coffee, chocolate, sodas, tea). ?Do bring a list of your current medications with you.  If not listed below, you may take your medications as normal. ?Do wear comfortable clothes (no dresses or overalls) and walking shoes, tennis shoes preferred (No heels or open toe shoes are allowed). ?Do NOT wear cologne, perfume, aftershave, or lotions (deodorant is allowed). ?If these instructions are not followed, your test will have to be rescheduled. ? ? ? ?Follow-Up: ?At [facility], you and your health needs are our priority.  As part of our  continuing mission to provide you with exceptional heart care, we have created designated Provider Care Teams.  These Care Teams include your primary Cardiologist (physician) and Advanced Practice Providers (APPs -  Physician Assistants and Nurse Practitioners) who all work together to provide you with the care you need, when you need it. ? ?We recommend signing up for the patient portal called "MyChart".  Sign up information is provided on this After Visit Summary.  MyChart is used to connect with patients for Virtual Visits (Telemedicine).  Patients are able to view lab/test results, encounter notes, upcoming appointments, etc.  Non-urgent messages can be sent to your provider as well.   ?To learn more about what you can do with MyChart, go to [email or web address].   ? ?Your next appointment:   ?4 month(s) ? ?The format for your next appointment:   ?In Person ? ?Provider:   ?Berniece Salines, DO   ? ? ?Other Instructions ?  ?

## 2021-12-19 ENCOUNTER — Other Ambulatory Visit (HOSPITAL_COMMUNITY): Payer: Self-pay

## 2021-12-20 ENCOUNTER — Encounter: Payer: Self-pay | Admitting: Cardiology

## 2021-12-20 ENCOUNTER — Telehealth: Payer: Self-pay

## 2021-12-20 ENCOUNTER — Telehealth (HOSPITAL_COMMUNITY): Payer: Self-pay | Admitting: *Deleted

## 2021-12-20 NOTE — Telephone Encounter (Signed)
Close encounter 

## 2021-12-20 NOTE — Telephone Encounter (Signed)
Spoke with DOD [place]- advised he reviewed [facility], seems patient is back in flutter. He recommended patient take 100 mg Flecainide now, take 50 mg tonight, take extra metoprolol 25 mg now, and if he is still in this tomorrow, he recommended another dose of 100 mg Flecainide.  ? ?Patient aware, and understands, if he needs [place] he is aware of after hours line as well as ED if needed.  ? ?Patient verbalized understanding. ? ?

## 2021-12-20 NOTE — Telephone Encounter (Signed)
Contacted patient via [employer]. He states since 9 AM and since he woke up this morning he has had issues with his HR, running 128-130's and up. He states he feels fine, doesn't notice any different. He states he has a Paramedic and they are working on getting the strips to be sent over. I advised him I could have it reviewed, otherwise I would recommend he go to ED for them to run an EKG, and see what rhythm he is in since recent cardioversion. Patient will send EKG from [facility] and we will make a plan from this. ? ?Patient verbalized understanding.  ?

## 2021-12-23 ENCOUNTER — Telehealth: Payer: Self-pay

## 2021-12-23 NOTE — Telephone Encounter (Signed)
Per other telephone note (December 20, 2021  4:17 PM): ?  ?Spoke with DOD [place]- advised he reviewed [facility], seems patient is back in flutter. He recommended patient take 100 mg Flecainide now, take 50 mg tonight, take extra metoprolol 25 mg now, and if he is still in this tomorrow, he recommended another dose of 100 mg Flecainide.  ?  ?Patient aware, and understands, if he needs [place] he is aware of after hours line as well as ED if needed.  ?  ?Patient verbalized understanding. ?-JulieThompson, LPN ? ?Called pt he states that he is now back in sinus and HR back to 68 BPM.  Pt states that he followed the med directions as above. Pt states that he did not have to take the Flecainide on Sunday. Pt states that he is not having any sx now either. He will go to [employer], and will let us know if racing HR returns and he will send [place] mobile. ?

## 2021-12-23 NOTE — Telephone Encounter (Signed)
? ?  Pre-operative Risk Assessment  ?  ?Patient Name: Arthur Barrett  ?DOB: October 12, 1956 ?MRN: [id number]  ? ?Request for Surgical Clearance   ? ?Procedure:   PROSTATE BIOPSY ? ?Date of Surgery:  Clearance 01/28/22                              ?   ?Surgeon:  DR Titus Dubin ?Surgeon's Group or Practice Name:  [facility]  ?Phone number:  [phone] ?Fax number:  [phone] ?  ?Type of Clearance Requested:   ?- Medical  ?- Pharmacy:  Hold Apixaban (Eliquis) 3 DAYS PRIOR ?  ?Type of Anesthesia:   NOT LISTED ?  ?Additional requests/questions:   ? ? ?

## 2021-12-23 NOTE — Telephone Encounter (Signed)
Patient Name: Arthur Barrett  ?DOB: 1956/01/22 ?MRN: [id number]  ? ?Primary Cardiologist:Kardie Tobb, DO ? ?Chart reviewed as part of pre-operative protocol coverage. Because of Arthur Barrett's past medical history and time since last visit, he/she will require a follow-up visit in order to better assess preoperative cardiovascular risk. ? ?Patient has myocardial perfusion study pending for 12/24/21. We will await these results prior to scheduling patient for virtual or in-person visit.  ? ?He underwent DCCV on 12/06/21, therefore anticoagulation will not need to be interrupted for 4 weeks which concludes on 01/03/22.  ? ?Routing to Pharm D for advisement regarding holding  Eliquis for 3 days prior to prostate biopsy scheduled for 01/28/22. ? ?Emmaline Life, NP-C ? ?  ?12/23/2021, 10:43 AM ?[place] ?[address], [address] ?Office [phone] Fax [phone] ? ?

## 2021-12-24 ENCOUNTER — Other Ambulatory Visit: Payer: Self-pay

## 2021-12-24 ENCOUNTER — Encounter: Payer: Self-pay | Admitting: Cardiology

## 2021-12-24 ENCOUNTER — Ambulatory Visit (HOSPITAL_COMMUNITY)
Admission: RE | Admit: 2021-12-24 | Discharge: 2021-12-24 | Disposition: A | Discharge status: Home / Self Care | Payer: PPO | Source: Ambulatory Visit | Attending: Cardiology | Admitting: Cardiology

## 2021-12-24 DIAGNOSIS — I4891 Unspecified atrial fibrillation: Secondary | ICD-10-CM | POA: Insufficient documentation

## 2021-12-24 LAB — MYOCARDIAL PERFUSION IMAGING
LV dias vol: 137 mL (ref 62–150)
LV sys vol: 75 mL
Nuc Stress EF: 46 %
Peak HR: 79 {beats}/min
Rest HR: 45 {beats}/min
Rest Nuclear Isotope Dose: 10.9 mCi
SDS: 2
SRS: 0
SSS: 2
ST Depression (mm): 0 mm
Stress Nuclear Isotope Dose: 32 mCi
TID: 1.08

## 2021-12-24 MED ORDER — REGADENOSON 0.4 MG/5ML IV SOLN
0.4000 mg | Freq: Once | INTRAVENOUS | Status: AC
  Administered 2021-12-24: 0.4 mg via INTRAVENOUS

## 2021-12-24 MED ORDER — TECHNETIUM TC 99M TETROFOSMIN IV KIT
32.0000 | PACK | Freq: Once | INTRAVENOUS | Status: AC | PRN
  Administered 2021-12-24: 32 via INTRAVENOUS
  Filled 2021-12-24: qty 32

## 2021-12-24 MED ORDER — TECHNETIUM TC 99M TETROFOSMIN IV KIT
10.9000 | PACK | Freq: Once | INTRAVENOUS | Status: AC | PRN
  Administered 2021-12-24: 10.9 via INTRAVENOUS
  Filled 2021-12-24: qty 11

## 2021-12-24 NOTE — Telephone Encounter (Signed)
Patient with diagnosis of afib on Eliquis for anticoagulation.   ? ?Procedure: prostate biopsy ?Date of procedure: 01/28/22 ? ?CHA2DS2-VASc Score = 2  ?This indicates a 2.2% annual risk of stroke. ?The patient's score is based upon: ?CHF History: 0 ?HTN History: 0 ?Diabetes History: 0 ?Stroke History: 0 ?Vascular Disease History: 1 ?Age Score: 1 ?Gender Score: 0 ? ?Underwent DCCV on 12/06/21, unable to stop anticoag for 4 weeks after. ? ?CrCl 26m/min ?Platelet count 182K ? ?Per office protocol, patient can hold Eliquis for 3 days prior to procedure as requested. Procedure date is scheduled > 30 days after cardioversion.   ?

## 2021-12-25 NOTE — Telephone Encounter (Signed)
Dr. Harriet Masson, stress test result as: ?Fixed perfusion defect at apex with hypokinesis consistent with infarct ?Fixed inferior perfusion defect with normal wall motion suggests artifact ?Mild systolic dysfunction (EF 09%) ?Intermediate risk study due to systolic dysfunction.  No ischemia. ? ? ?Echo on 12/04/21 with LVEF of 40-45%.  ? ?Please provide final recommendations regarding clearance.  ?Please forward your response to P CV DIV PREOP.  ? ?Thank you  ?

## 2021-12-26 ENCOUNTER — Other Ambulatory Visit: Payer: Self-pay

## 2021-12-26 DIAGNOSIS — K449 Diaphragmatic hernia without obstruction or gangrene: Secondary | ICD-10-CM | POA: Diagnosis not present

## 2021-12-26 DIAGNOSIS — Z9289 Personal history of other medical treatment: Secondary | ICD-10-CM

## 2021-12-26 DIAGNOSIS — N200 Calculus of kidney: Secondary | ICD-10-CM | POA: Diagnosis not present

## 2021-12-26 DIAGNOSIS — M47816 Spondylosis without myelopathy or radiculopathy, lumbar region: Secondary | ICD-10-CM | POA: Diagnosis not present

## 2021-12-26 DIAGNOSIS — N201 Calculus of ureter: Secondary | ICD-10-CM | POA: Diagnosis not present

## 2021-12-26 HISTORY — DX: Personal history of other medical treatment: Z92.89

## 2021-12-27 ENCOUNTER — Other Ambulatory Visit: Payer: Self-pay

## 2021-12-27 ENCOUNTER — Telehealth (INDEPENDENT_AMBULATORY_CARE_PROVIDER_SITE_OTHER): Payer: PPO | Admitting: Cardiology

## 2021-12-27 ENCOUNTER — Encounter: Payer: Self-pay | Admitting: Cardiology

## 2021-12-27 ENCOUNTER — Telehealth: Payer: Self-pay

## 2021-12-27 ENCOUNTER — Other Ambulatory Visit (HOSPITAL_COMMUNITY): Payer: Self-pay

## 2021-12-27 VITALS — BP 106/81 | HR 127 | Ht 69.0 in | Wt 172.0 lb

## 2021-12-27 DIAGNOSIS — I480 Paroxysmal atrial fibrillation: Secondary | ICD-10-CM

## 2021-12-27 MED ORDER — AMIODARONE HCL 200 MG PO TABS
ORAL_TABLET | ORAL | 3 refills | Status: DC
  Filled 2021-12-27: qty 90, 68d supply, fill #0

## 2021-12-27 NOTE — Patient Instructions (Addendum)
Medication Instructions:  ?Your physician has recommended you make the following change in your medication:  ?START: Amiodarone 400 mg (2 tablets) twice daily for 5 days, then 200 mg (1 tablet) twice daily for 7 days, then 200 mg (1 tablet) once daily ?*If you need a refill on your cardiac medications before your next appointment, please call your pharmacy* ? ? ?Lab Work: ?None ?If you have labs (blood work) drawn today and your tests are completely normal, you will receive your results only by: ?MyChart Message (if you have MyChart) OR ?A paper copy in the mail ?If you have any lab test that is abnormal or we need to change your treatment, we will call you to review the results. ? ? ?Testing/Procedures: ?None ? ? ?Follow-Up: ?At [facility], you and your health needs are our priority.  As part of our continuing mission to provide you with exceptional heart care, we have created designated Provider Care Teams.  These Care Teams include your primary Cardiologist (physician) and Advanced Practice Providers (APPs -  Physician Assistants and Nurse Practitioners) who all work together to provide you with the care you need, when you need it. ? ?We recommend signing up for the patient portal called "MyChart".  Sign up information is provided on this After Visit Summary.  MyChart is used to connect with patients for Virtual Visits (Telemedicine).  Patients are able to view lab/test results, encounter notes, upcoming appointments, etc.  Non-urgent messages can be sent to your provider as well.   ?To learn more about what you can do with MyChart, go to [email or web address].   ? ?Your next appointment:   ?12 week(s) ? ?The format for your next appointment:   ?In Person ? ?Provider:   ?Berniece Salines, DO   ? ? ?Other Instructions ?  ?

## 2021-12-27 NOTE — Telephone Encounter (Signed)
Called pt to go over his AVS. He verbalized understanding, no questions or concerns voiced at this time.  ?

## 2021-12-27 NOTE — Progress Notes (Signed)
0.   Virtual Visit via Video Note   This visit type was conducted due to national recommendations for restrictions regarding the COVID-19 Pandemic (e.g. social distancing) in an effort to limit this patient's exposure and mitigate transmission in our community.  Due to his co-morbid illnesses, this patient is at least at moderate risk for complications without adequate follow up.  This format is felt to be most appropriate for this patient at this time.  All issues noted in this document were discussed and addressed.  A limited physical exam was performed with this format.  Please refer to the patient's chart for his consent to telehealth for [facility].      Date:  12/27/2021   ID:  Arthur Barrett, DOB 03-27-1956, MRN [id number]  Patient Location: Home Provider Location: Office/Clinic  PCP:  Shon Baton, MD  Cardiologist:  Berniece Salines, DO  Electrophysiologist:  None   Evaluation Performed:  Follow-Up Visit  Chief Complaint:  Discuss stress test result  History of Present Illness:    Arthur Barrett is a 66 y.o. male with history of paroxysmal atrial fibrillation currently on beta-blocker and Eliquis, was admitted in February 2023 at [facility] at that time he was noted to be in atrial fibrillation which was a new diagnosis.  He required TEE cardioversion.  Currently on Eliquis.  I saw the patient on December 18, 2021 at that time during his presentation he told me he was still experiencing intermittent palpitations.  He has had some episodes of A-fib.  During that visit I started the patient on flecainide.  Also get an nuclear stress test.  He has started the flecainide, he had his nuclear stress test which he is here to discuss results evidence of myocardial infarction with no evidence of ischemia.    Today he tells me that he is experiencing increased heart rate and he may have had some episodes where he feels he is in A-fib.   The patient does not have symptoms  concerning for COVID-19 infection (fever, chills, cough, or new shortness of breath).    Past Medical History:  Diagnosis Date   Atrial fibrillation [facility])    Past Surgical History:  Procedure Laterality Date   APPENDECTOMY     CARDIOVERSION N/A 12/06/2021   Procedure: CARDIOVERSION;  Surgeon: Pixie Casino, MD;  Location: [facility] ENDOSCOPY;  Service: Cardiovascular;  Laterality: N/A;   colonscopy      LUMBAR LAMINECTOMY/DECOMPRESSION MICRODISCECTOMY Left 01/31/2015   Procedure: MICRO LUMBAR DECOMPRESSION LUMBAR FIVE TO SACRAL ONE ON LEFT;  Surgeon: Susa Day, MD;  Location: [facility] ORS;  Service: Orthopedics;  Laterality: Left;   TEE WITHOUT CARDIOVERSION N/A 12/06/2021   Procedure: TRANSESOPHAGEAL ECHOCARDIOGRAM (TEE);  Surgeon: Pixie Casino, MD;  Location: [facility] ENDOSCOPY;  Service: Cardiovascular;  Laterality: N/A;     Current Meds  Medication Sig   amiodarone (PACERONE) 200 MG tablet Take 2 tablets by mouth twice daily for 5 days, then 1 tablet twice daily for 7 days, then 1 tablet once daily   apixaban (ELIQUIS) 5 MG TABS tablet Take 1 tablet (5 mg total) by mouth 2 (two) times daily.   atorvastatin (LIPITOR) 20 MG tablet Take 1 tablet (20 mg total) by mouth at bedtime.   metoprolol tartrate (LOPRESSOR) 25 MG tablet Take 1 tablet (25 mg total) by mouth 2 (two) times daily.   mirabegron ER (MYRBETRIQ) 25 MG TB24 tablet Take 1 tablet (25 mg total) by mouth daily.   tamsulosin (FLOMAX) 0.4 MG CAPS  capsule Take 1 capsule (0.4 mg total) by mouth daily after supper.     Allergies:   Patient has no known allergies.   Social History   Tobacco Use   Smoking status: Never   Smokeless tobacco: Never  Substance Use Topics   Alcohol use: No   Drug use: No     Family Hx: The patient's family history is not on file.  ROS:   Please see the history of present illness.     All other systems reviewed and are negative.   Prior CV studies:   The following studies were reviewed  today:  TEE2/17/2023 IMPRESSIONS     1. Left ventricular ejection fraction, by estimation, is 50 to 55%. The  left ventricle has low normal function.   2. Right ventricular systolic function is normal. The right ventricular  size is normal.   3. Left atrial size was moderately dilated. No left atrial/left atrial  appendage thrombus was detected.   4. The mitral valve is grossly normal. Trivial mitral valve  regurgitation.   5. The aortic valve is tricuspid. Aortic valve regurgitation is not  visualized.   6. Aortic dilatation noted. There is borderline dilatation of the  ascending aorta, measuring 38 mm.   Conclusion(s)/Recommendation(s): No LA/LAA thrombus identified. Successful  cardioversion performed with restoration of normal sinus rhythm.   FINDINGS   Left Ventricle: Left ventricular ejection fraction, by estimation, is 50  to 55%. The left ventricle has low normal function. The left ventricular  internal cavity size was normal in size. There is no left ventricular  hypertrophy.   Right Ventricle: The right ventricular size is normal. No increase in  right ventricular wall thickness. Right ventricular systolic function is  normal.   Left Atrium: Left atrial size was moderately dilated. No left atrial/left  atrial appendage thrombus was detected.   Right Atrium: Right atrial size was normal in size.   Pericardium: There is no evidence of pericardial effusion.   Mitral Valve: The mitral valve is grossly normal. Trivial mitral valve  regurgitation.   Tricuspid Valve: The tricuspid valve is grossly normal. Tricuspid valve  regurgitation is trivial.   Aortic Valve: The aortic valve is tricuspid. Aortic valve regurgitation is  not visualized.   Pulmonic Valve: The pulmonic valve was normal in structure. Pulmonic valve  regurgitation is not visualized.   Aorta: Aortic dilatation noted. There is borderline dilatation of the  ascending aorta, measuring 38 mm.    IAS/Shunts: No atrial level shunt detected by color flow Doppler.         LV Volumes (MOD)  LV vol d, MOD A4C: 80.3 ml  LV vol s, MOD A4C: 38.5 ml  LV SV MOD A4C:     80.3 ml   Lyman Bishop MD  Electronically signed by Lyman Bishop MD  Signature Date/Time: 12/06/2021/3:19:14 PM       Pharmacologic nuclear stress test December 24, 2021   Findings are consistent with prior myocardial infarction. The study is intermediate risk.   No ST deviation was noted.   LV perfusion is abnormal. Defect 1: There is a small defect with mild reduction in uptake present in the apex location(s) that is fixed. There is abnormal wall motion in the defect area. Consistent with infarction. Defect 2: There is a medium defect with mild reduction in uptake present in the apical to basal inferior location(s) that is fixed. There is normal wall motion in the defect area. Consistent with artifact caused by diaphragmatic attenuation.  Left ventricular function is abnormal. End diastolic cavity size is mildly enlarged. End systolic cavity size is mildly enlarged.   Prior study not available for comparison.   Fixed perfusion defect at apex with hypokinesis consistent with infarct Fixed inferior perfusion defect with normal wall motion suggests artifact Mild systolic dysfunction (EF 16%) Intermediate risk study due to systolic dysfunction.  No ischemia.   Labs/Other Tests and Data Reviewed:    EKG: None today  Recent Labs: 12/04/2021: ALT 25; TSH 1.355 12/05/2021: Hemoglobin 14.3; Platelets 182 12/06/2021: BUN 15; Creatinine, Ser 1.10; Magnesium 2.0; Potassium 3.9; Sodium 139   Recent Lipid Panel Lab Results  Component Value Date/Time   CHOL 178 12/05/2021 03:31 AM   TRIG 80 12/05/2021 03:31 AM   HDL 38 (L) 12/05/2021 03:31 AM   CHOLHDL 4.7 12/05/2021 03:31 AM   LDLCALC 124 (H) 12/05/2021 03:31 AM    Wt Readings from Last 3 Encounters:  12/27/21 172 lb (78 kg)  12/24/21 178 lb (80.7 kg)  12/18/21 178  lb 6.4 oz (80.9 kg)     Objective:    Vital Signs:  BP 106/81    Pulse (!) 127    Ht '5\' 9"'$  (1.753 m)    Wt 172 lb (78 kg)    BMI 25.40 kg/m      ASSESSMENT & PLAN:    Paroxysmal atrial fibrillation-I had to stop the flecainide giving the result from his nuclear stress that showed no infarction and depressed ejection fraction.  He still has slowing of beta-blocker we will continue it, his heart rate of the flecainide has been in the low 100s.  And he is symptomatic.  Although he has not been in atrial fibrillation.  He may benefit from antiarrhythmics but choice of antiarrhythmics may be limited.  We will not restart flecainide.  We will start the patient on amiodarone.  I have directed him on how to take this medication.  I will also refer the patient to EP for evaluation. I spoke with the patient about his test finding.  2.  He is pending PSA biopsy which needs to stop his anticoagulation, but given his recent TEE cardioversion this procedures can be done in April.  Time for the procedure okay to hold the patient Eliquis 2 days prior to the procedure and restart as soon as possible at the discretion of the physician performing the procedure.   COVID-19 Education: The signs and symptoms of COVID-19 were discussed with the patient and how to seek care for testing (follow up with PCP or arrange E-visit).  The importance of social distancing was discussed today.  Time:   Today, I have spent 20 minutes with the patient with telehealth technology discussing the above problems.     Medication Adjustments/Labs and Tests Ordered: Current medicines are reviewed at length with the patient today.  Concerns regarding medicines are outlined above.   Tests Ordered: Orders Placed This Encounter  Procedures   Ambulatory referral to Cardiac Electrophysiology    Medication Changes: Meds ordered this encounter  Medications   amiodarone (PACERONE) 200 MG tablet    Sig: Take 2 tablets by mouth twice  daily for 5 days, then 1 tablet twice daily for 7 days, then 1 tablet once daily    Dispense:  90 tablet    Refill:  3    After initial fill instructions should say "Take 1 tablet daily"    Follow Up:  Virtual Visit  in 6 month(s)  Signed, Kalie Cabral, DO  12/27/2021 10:13 PM    [facility]

## 2021-12-27 NOTE — Telephone Encounter (Signed)
Spoke to patient he stated he has had a fast heart beat since 12:30 am this morning.Rate staying around 128.B/P 116/84.Stated he just don't feel right.No chest pain.Stated he has a virtual visit with Dr.Tobb this morning at 11:00 am.Stated he attached Kardia mobile strips.Advised I will make Dr.Tobb's RN aware. ?

## 2021-12-27 NOTE — Telephone Encounter (Signed)
Pt seen by Dr. Harriet Masson today.  ?

## 2021-12-30 ENCOUNTER — Encounter: Payer: Self-pay | Admitting: Cardiology

## 2021-12-31 ENCOUNTER — Other Ambulatory Visit: Payer: Self-pay

## 2021-12-31 MED ORDER — AMIODARONE HCL 200 MG PO TABS: 200.0000 mg | ORAL_TABLET | Freq: Every day | ORAL | 3 refills | Status: DC

## 2021-12-31 NOTE — Telephone Encounter (Signed)
He can proceed with his procedure.  ?

## 2021-12-31 NOTE — Progress Notes (Signed)
Amiodarone dose decreased to 200 mg daily per message from Dr. Harriet Masson via [employer].  ?

## 2022-01-02 ENCOUNTER — Other Ambulatory Visit (HOSPITAL_COMMUNITY): Payer: Self-pay

## 2022-01-02 DIAGNOSIS — Z7901 Long term (current) use of anticoagulants: Secondary | ICD-10-CM | POA: Diagnosis not present

## 2022-01-02 DIAGNOSIS — I4892 Unspecified atrial flutter: Secondary | ICD-10-CM | POA: Diagnosis not present

## 2022-01-02 DIAGNOSIS — Z79899 Other long term (current) drug therapy: Secondary | ICD-10-CM | POA: Diagnosis not present

## 2022-01-02 DIAGNOSIS — M549 Dorsalgia, unspecified: Secondary | ICD-10-CM | POA: Diagnosis not present

## 2022-01-02 DIAGNOSIS — N200 Calculus of kidney: Secondary | ICD-10-CM | POA: Diagnosis not present

## 2022-01-02 DIAGNOSIS — E785 Hyperlipidemia, unspecified: Secondary | ICD-10-CM | POA: Diagnosis not present

## 2022-01-02 DIAGNOSIS — I700 Atherosclerosis of aorta: Secondary | ICD-10-CM | POA: Diagnosis not present

## 2022-01-02 DIAGNOSIS — I77819 Aortic ectasia, unspecified site: Secondary | ICD-10-CM | POA: Diagnosis not present

## 2022-01-02 DIAGNOSIS — M79675 Pain in left toe(s): Secondary | ICD-10-CM | POA: Diagnosis not present

## 2022-01-02 DIAGNOSIS — D6869 Other thrombophilia: Secondary | ICD-10-CM | POA: Diagnosis not present

## 2022-01-02 DIAGNOSIS — N402 Nodular prostate without lower urinary tract symptoms: Secondary | ICD-10-CM | POA: Diagnosis not present

## 2022-01-02 DIAGNOSIS — I5020 Unspecified systolic (congestive) heart failure: Secondary | ICD-10-CM | POA: Diagnosis not present

## 2022-01-02 NOTE — Telephone Encounter (Signed)
? ? ?  Patient Name: Demetric Dunnaway  ?DOB: [birth year] ?MRN: [id number] ? ?Primary Cardiologist: Berniece Salines, DO ? ?Chart reviewed as part of pre-operative protocol coverage. Patient has a prostate biopsy planned for 01/28/2022. Recent Echo showed LVEF of 40-45% and recent Myoview showed 2 fixed defects but no evidence of ischemia. Per Dr. Harriet Masson, "he can proceed with his procedure." ? ?Per Pharmacy and office protocol, "patient can hold Eliquis for 3 days prior to procedure as requested. Procedure date is scheduled > 30 days after cardioversion." ? ?I will route this recommendation to the requesting party via Epic fax function and remove from pre-op pool. ? ?Please call with questions. ? ?Darreld Mclean, [name] ?01/02/2022, 9:34 AM ? ?

## 2022-01-06 ENCOUNTER — Encounter: Payer: Self-pay | Admitting: Cardiology

## 2022-01-07 ENCOUNTER — Encounter: Payer: Self-pay | Admitting: Cardiology

## 2022-01-07 NOTE — Telephone Encounter (Signed)
Spoke with patient who reported heart rate in the mornings has ranged from 48 - 130. He said the amiodarone 200 mg daily worked for about a week, then his heart rate started to spee up. He denies sob, dizziness, or lightheadedness. He stated he is anxious over the heart rate being high. He sent in V/S and Kardi mobile readings for Dr. Harriet Masson to review. ?

## 2022-01-09 ENCOUNTER — Emergency Department (HOSPITAL_COMMUNITY): Payer: PPO

## 2022-01-09 ENCOUNTER — Institutional Professional Consult (permissible substitution): Payer: PPO | Admitting: Cardiology

## 2022-01-09 ENCOUNTER — Emergency Department (HOSPITAL_COMMUNITY)
Admission: EM | Admit: 2022-01-09 | Discharge: 2022-01-09 | Disposition: A | Discharge status: Home / Self Care | Payer: PPO | Attending: Emergency Medicine | Admitting: Emergency Medicine

## 2022-01-09 ENCOUNTER — Other Ambulatory Visit: Payer: Self-pay

## 2022-01-09 ENCOUNTER — Encounter (HOSPITAL_COMMUNITY): Payer: Self-pay

## 2022-01-09 DIAGNOSIS — Z7901 Long term (current) use of anticoagulants: Secondary | ICD-10-CM | POA: Insufficient documentation

## 2022-01-09 DIAGNOSIS — N132 Hydronephrosis with renal and ureteral calculous obstruction: Secondary | ICD-10-CM | POA: Insufficient documentation

## 2022-01-09 DIAGNOSIS — R109 Unspecified abdominal pain: Secondary | ICD-10-CM

## 2022-01-09 DIAGNOSIS — R1031 Right lower quadrant pain: Secondary | ICD-10-CM | POA: Diagnosis not present

## 2022-01-09 DIAGNOSIS — Z87442 Personal history of urinary calculi: Secondary | ICD-10-CM | POA: Diagnosis not present

## 2022-01-09 LAB — CBC WITH DIFFERENTIAL/PLATELET
Abs Immature Granulocytes: 0.02 10*3/uL (ref 0.00–0.07)
Basophils Absolute: 0 10*3/uL (ref 0.0–0.1)
Basophils Relative: 0 %
Eosinophils Absolute: 0 10*3/uL (ref 0.0–0.5)
Eosinophils Relative: 0 %
HCT: 43.3 % (ref 39.0–52.0)
Hemoglobin: 14.8 g/dL (ref 13.0–17.0)
Immature Granulocytes: 0 %
Lymphocytes Relative: 8 %
Lymphs Abs: 0.7 10*3/uL (ref 0.7–4.0)
MCH: 32 pg (ref 26.0–34.0)
MCHC: 34.2 g/dL (ref 30.0–36.0)
MCV: 93.7 fL (ref 80.0–100.0)
Monocytes Absolute: 0.6 10*3/uL (ref 0.1–1.0)
Monocytes Relative: 7 %
Neutro Abs: 8 10*3/uL — ABNORMAL HIGH (ref 1.7–7.7)
Neutrophils Relative %: 85 %
Platelets: 162 10*3/uL (ref 150–400)
RBC: 4.62 MIL/uL (ref 4.22–5.81)
RDW: 12.5 % (ref 11.5–15.5)
WBC: 9.4 10*3/uL (ref 4.0–10.5)
nRBC: 0 % (ref 0.0–0.2)

## 2022-01-09 LAB — BASIC METABOLIC PANEL
Anion gap: 7 (ref 5–15)
BUN: 14 mg/dL (ref 8–23)
CO2: 25 mmol/L (ref 22–32)
Calcium: 8.9 mg/dL (ref 8.9–10.3)
Chloride: 107 mmol/L (ref 98–111)
Creatinine, Ser: 1.4 mg/dL — ABNORMAL HIGH (ref 0.61–1.24)
GFR, Estimated: 56 mL/min — ABNORMAL LOW (ref >60)
Glucose, Bld: 120 mg/dL — ABNORMAL HIGH (ref 70–99)
Potassium: 3.4 mmol/L — ABNORMAL LOW (ref 3.5–5.1)
Sodium: 139 mmol/L (ref 135–145)

## 2022-01-09 LAB — HEPATIC FUNCTION PANEL
ALT: 31 U/L (ref 0–44)
AST: 24 U/L (ref 15–41)
Albumin: 3.8 g/dL (ref 3.5–5.0)
Alkaline Phosphatase: 43 U/L (ref 38–126)
Bilirubin, Direct: 0.2 mg/dL (ref 0.0–0.2)
Indirect Bilirubin: 0.7 mg/dL (ref 0.3–0.9)
Total Bilirubin: 0.9 mg/dL (ref 0.3–1.2)
Total Protein: 6.4 g/dL — ABNORMAL LOW (ref 6.5–8.1)

## 2022-01-09 MED ORDER — ONDANSETRON HCL 4 MG/2ML IJ SOLN
4.0000 mg | Freq: Four times a day (QID) | INTRAMUSCULAR | Status: DC | PRN
  Administered 2022-01-09: 4 mg via INTRAVENOUS
  Filled 2022-01-09: qty 2

## 2022-01-09 MED ORDER — ONDANSETRON 4 MG PO TBDP: 4.0000 mg | ORAL_TABLET | Freq: Three times a day (TID) | ORAL | 0 refills | Status: DC | PRN

## 2022-01-09 MED ORDER — OXYCODONE-ACETAMINOPHEN 5-325 MG PO TABS: 1.0000 | ORAL_TABLET | Freq: Four times a day (QID) | ORAL | 0 refills | Status: DC | PRN

## 2022-01-09 MED ORDER — FENTANYL CITRATE PF 50 MCG/ML IJ SOSY
50.0000 ug | PREFILLED_SYRINGE | INTRAMUSCULAR | Status: DC | PRN
  Administered 2022-01-09: 50 ug via INTRAVENOUS
  Filled 2022-01-09: qty 1

## 2022-01-09 MED ORDER — SODIUM CHLORIDE 0.9 % IV BOLUS
1000.0000 mL | Freq: Once | INTRAVENOUS | Status: AC
  Administered 2022-01-09: 1000 mL via INTRAVENOUS

## 2022-01-09 NOTE — Progress Notes (Deleted)
? ?Electrophysiology Office Note ? ? ?Date:  01/09/2022  ? ?ID:  Arthur Barrett, DOB 05/02/1956, MRN [id number] ? ?PCP:  Shon Baton, MD  ?Cardiologist:  Tobb ?Primary Electrophysiologist:  Simar Pothier Meredith Leeds, MD   ? ?Chief Complaint: AF ?  ?History of Present Illness: ?Arthur Barrett is a 66 y.o. male who is being seen today for the evaluation of AF at the request of Tobb, Kardie, DO. Presenting today for electrophysiology evaluation. ? ?She has a history significant for atrial fibrillation and mild systolic heart failure.  She was admitted to the hospital February 2023 and was noted to be in atrial fibrillation at that time.  She required TEE and cardioversion.  She was again seen March 2023 with intermittent palpitations.  She was started on flecainide.  Unfortunately she had a Myoview that showed evidence of myocardial infarction without evidence of ischemia.  She also had an echo that showed an ejection fraction of 40 to 45%. ? ?Today, he denies*** symptoms of palpitations, chest pain, shortness of breath, orthopnea, PND, lower extremity edema, claudication, dizziness, presyncope, syncope, bleeding, or neurologic sequela. The patient is tolerating medications without difficulties.  ? ? ?Past Medical History:  ?Diagnosis Date  ? Atrial fibrillation ([place])   ? ?Past Surgical History:  ?Procedure Laterality Date  ? APPENDECTOMY    ? CARDIOVERSION N/A 12/06/2021  ? Procedure: CARDIOVERSION;  Surgeon: Pixie Casino, MD;  Location: [facility] ENDOSCOPY;  Service: Cardiovascular;  Laterality: N/A;  ? colonscopy     ? LUMBAR LAMINECTOMY/DECOMPRESSION MICRODISCECTOMY Left 01/31/2015  ? Procedure: MICRO LUMBAR DECOMPRESSION LUMBAR FIVE TO SACRAL ONE ON LEFT;  Surgeon: Susa Day, MD;  Location: [facility] ORS;  Service: Orthopedics;  Laterality: Left;  ? TEE WITHOUT CARDIOVERSION N/A 12/06/2021  ? Procedure: TRANSESOPHAGEAL ECHOCARDIOGRAM (TEE);  Surgeon: Pixie Casino, MD;  Location: [place];  Service: Cardiovascular;   Laterality: N/A;  ? ? ? ?No current facility-administered medications for this visit.  ? ?Current Outpatient Medications  ?Medication Sig Dispense Refill  ? amiodarone (PACERONE) 200 MG tablet Take 1 tablet (200 mg total) by mouth daily. 90 tablet 3  ? apixaban (ELIQUIS) 5 MG TABS tablet Take 1 tablet (5 mg total) by mouth 2 (two) times daily. 60 tablet 2  ? atorvastatin (LIPITOR) 20 MG tablet Take 1 tablet (20 mg total) by mouth at bedtime. 30 tablet 2  ? metoprolol tartrate (LOPRESSOR) 25 MG tablet Take 1 tablet (25 mg total) by mouth 2 (two) times daily. 180 tablet 3  ? mirabegron ER (MYRBETRIQ) 25 MG TB24 tablet Take 1 tablet (25 mg total) by mouth daily. 90 tablet 3  ? tamsulosin (FLOMAX) 0.4 MG CAPS capsule Take 1 capsule (0.4 mg total) by mouth daily after supper. 30 capsule 2  ? ?Facility-Administered Medications Ordered in Other Visits  ?Medication Dose Route Frequency Provider Last Rate Last Admin  ? fentaNYL (SUBLIMAZE) injection 50 mcg  50 mcg Intravenous Q1H PRN Pati Gallo S, PA   50 mcg at 01/09/22 0716  ? ondansetron (ZOFRAN) injection 4 mg  4 mg Intravenous Q6H PRN Pati Gallo S, PA   4 mg at 01/09/22 2992  ? ? ?Allergies:   Patient has no known allergies.  ? ?Social History:  The patient  reports that he has never smoked. He has never used smokeless tobacco. He reports that he does not drink alcohol and does not use drugs.  ? ?Family History:  The patient's ***family history is not on file.  ? ? ?ROS:  Please see  the history of present illness.   Otherwise, review of systems is positive for none.   All other systems are reviewed and negative.  ? ? ?PHYSICAL EXAM: ?VS:  There were no vitals taken for this visit. , BMI There is no height or weight on file to calculate BMI. ?GEN: Well nourished, well developed, in no acute distress  ?HEENT: normal  ?Neck: no JVD, carotid bruits, or masses ?Cardiac: ***RRR; no murmurs, rubs, or gallops,no edema  ?Respiratory:  clear to auscultation bilaterally,  normal work of breathing ?GI: soft, nontender, nondistended, + BS ?MS: no deformity or atrophy  ?Skin: warm and dry ?Neuro:  Strength and sensation are intact ?Psych: euthymic mood, full affect ? ?EKG:  EKG {ACTION; IS/IS [id number]} ordered today. ?Personal review of the ekg ordered *** shows *** ? ?Recent Labs: ?12/04/2021: TSH 1.355 ?12/06/2021: Magnesium 2.0 ?01/09/2022: ALT 31; BUN 14; Creatinine, Ser 1.40; Hemoglobin 14.8; Platelets 162; Potassium 3.4; Sodium 139  ? ? ?Lipid Panel  ?   ?Component Value Date/Time  ? CHOL 178 12/05/2021 0331  ? TRIG 80 12/05/2021 0331  ? HDL 38 (L) 12/05/2021 0331  ? CHOLHDL 4.7 12/05/2021 0331  ? VLDL 16 12/05/2021 0331  ? Semmes 124 (H) 12/05/2021 0331  ? ? ? ?Wt Readings from Last 3 Encounters:  ?01/09/22 171 lb 15.3 oz (78 kg)  ?12/27/21 172 lb (78 kg)  ?12/24/21 178 lb (80.7 kg)  ?  ? ? ?Other studies Reviewed: ?Additional studies/ records that were reviewed today include: TTE 12/04/21  ?Review of the above records today demonstrates:  ? 1. Left ventricular ejection fraction, by estimation, is 40 to 45%. The  ?left ventricle has mildly decreased function. The left ventricle has no  ?regional wall motion abnormalities. There is mild left ventricular  ?hypertrophy. Left ventricular diastolic  ?function could not be evaluated.  ? 2. Right ventricular systolic function is mildly reduced. The right  ?ventricular size is normal.  ? 3. Left atrial size was mild to moderately dilated.  ? 4. Right atrial size was mild to moderately dilated.  ? 5. The mitral valve is normal in structure. Trivial mitral valve  ?regurgitation. No evidence of mitral stenosis.  ? 6. The aortic valve is tricuspid. There is mild calcification of the  ?aortic valve. Aortic valve regurgitation is not visualized. No aortic  ?stenosis is present.  ? 7. Aortic dilatation noted. There is mild dilatation of the ascending  ?aorta, measuring 40 mm.  ? 8. The inferior vena cava is normal in size with greater than 50%   ?respiratory variability, suggesting right atrial pressure of 3 mmHg.  ? 9. EF hard to assess in the setting of AFL. Appears 40-45%.  ? ?Myoview 12/24/21 ?Fixed perfusion defect at apex with hypokinesis consistent with infarct ?Fixed inferior perfusion defect with normal wall motion suggests artifact ?Mild systolic dysfunction (EF 86%) ?Intermediate risk study due to systolic dysfunction.  No ischemia. ? ?ASSESSMENT AND PLAN: ? ?1.  Persistent atrial fibrillation: CHA2DS2-VASc of 1.  Currently on amiodarone 200 mg daily, metoprolol 25 mg twice daily, Eliquis 5 mg twice daily.*** ? ?2.  Chronic systolic heart failure: Ejection fraction mildly reduced. ? ? ?Current medicines are reviewed at length with the patient today.   ?The patient {ACTIONS; HAS/DOES NOT HAVE:19233} concerns regarding his medicines.  The following changes were made today:  {NONE DEFAULTED:18576} ? ?Labs/ tests ordered today include: *** ?No orders of the defined types were placed in this encounter. ? ? ? ?Disposition:  FU with Senai Ramnath {gen number [id number]} {Days to years:10300} ? ?Signed, ?[place] Meredith Leeds, MD  ?01/09/2022 8:43 AM    ? ?[facility] ?[address] ?[address] ?[place] [address] ?[phone] (office) ?[phone] (fax) ? ?

## 2022-01-09 NOTE — ED Triage Notes (Signed)
Pt present to the ED with flank pain.Pt reports that he was recently dx with 2 kidney stones 4MM in size. Pt reports he is suppose to get one surgically removed but they haven't called to schedule the operation. . Pt reports he is taking oxycodone at home with no relief. Pt reported the pain woke him up at 2am this morning.  ?

## 2022-01-09 NOTE — ED Notes (Signed)
Patient transported to X-ray 

## 2022-01-09 NOTE — Discharge Instructions (Signed)
Please call [facility] today to schedule appointment they are aware of your case and should help expedite you ? ?Take Tylenol 1000 mg every 6 hours for pain.  Drink plenty of water.  Continue to take the tamsulosin/Flomax.  I prescribed you Zofran for any nausea you experience and Percocet to take for any breakthrough pain.  If you do take the Percocet please skip 1 dose of Tylenol--because Percocet has Tylenol in it. ?

## 2022-01-09 NOTE — ED Provider Notes (Signed)
?Arthur Barrett ?Provider Note ? ? ?CSN: [id number] ?Arrival date & time: 01/09/22  8882 ? ?  ? ?History ? ?Chief Complaint  ?Patient presents with  ? Flank Pain  ? ? ?Arthur Barrett is a 66 y.o. male. ? ? ?Flank Pain ? ?Patient is a 66 year old male with past medical history significant for A-fib DOAC, history of ureteral lithiasis ? ?Appendicectomy ? ?Patient presented emergency room today states that he woke up at 2 AM this morning with severe right-sided abdominal pain seems to radiate down into his testicles as well as up into his right flank.  He endorses 10/10 pain states that it is sharp seems to be intermittent has improved since he took 1 Percocet tablet this morning. ? ?Denies any nausea or vomiting no fevers.  States that he has not urinated yet this morning.  No hematuria over the past week.  Seems that 2/15 he was diagnosed with a 4 mm stone was admitted because of new onset A-fib at that time.  He had a follow-up appointment with urology and had a CT scan that showed no progression in his stones passage.  He was told that he would need a procedure done at some point but was then lost to follow-up with [facility]. ? ?No diarrhea or blood in stool.  Moderate constipation he states for the past 24 hours. ?  ? ?Home Medications ?Prior to Admission medications   ?Medication Sig Start Date End Date Taking? Authorizing Provider  ?ondansetron (ZOFRAN-ODT) 4 MG disintegrating tablet Take 1 tablet (4 mg total) by mouth every 8 (eight) hours as needed for nausea or vomiting. 01/09/22  Yes Pati Gallo S, PA  ?oxyCODONE-acetaminophen (PERCOCET/ROXICET) 5-325 MG tablet Take 1 tablet by mouth every 6 (six) hours as needed for severe pain. 01/09/22  Yes Tedd Sias, PA  ?amiodarone (PACERONE) 200 MG tablet Take 1 tablet (200 mg total) by mouth daily. 12/31/21   Tobb, Godfrey Pick, DO  ?apixaban (ELIQUIS) 5 MG TABS tablet Take 1 tablet (5 mg total) by mouth 2 (two) times  daily. 12/06/21 03/06/22  [place] Eric J, DO  ?atorvastatin (LIPITOR) 20 MG tablet Take 1 tablet (20 mg total) by mouth at bedtime. 12/06/21 03/06/22  [place] Eric J, DO  ?metoprolol tartrate (LOPRESSOR) 25 MG tablet Take 1 tablet (25 mg total) by mouth 2 (two) times daily. 12/18/21 03/18/22  Tobb, Godfrey Pick, DO  ?mirabegron ER (MYRBETRIQ) 25 MG TB24 tablet Take 1 tablet (25 mg total) by mouth daily. 12/18/21     ?tamsulosin (FLOMAX) 0.4 MG CAPS capsule Take 1 capsule (0.4 mg total) by mouth daily after supper. 12/06/21 03/06/22  [place] Eric J, DO  ?   ? ?Allergies    ?Patient has no known allergies.   ? ?Review of Systems   ?Review of Systems  ?Genitourinary:  Positive for flank pain.  ? ?Physical Exam ?Updated Vital Signs ?BP 133/71   Pulse (!) 55   Temp 98.8 ?F (37.1 ?C) (Oral)   Resp 16   Ht '5\' 9"'$  (1.753 m)   Wt 78 kg   SpO2 99%   BMI 25.39 kg/m?  ?Physical Exam ?Vitals and nursing note reviewed.  ?Constitutional:   ?   General: He is not in acute distress. ?HENT:  ?   Head: Normocephalic and atraumatic.  ?   Nose: Nose normal.  ?Eyes:  ?   General: No scleral icterus. ?Cardiovascular:  ?   Rate and Rhythm: Normal rate and regular rhythm.  ?  Pulses: Normal pulses.  ?   Heart sounds: Normal heart sounds.  ?Pulmonary:  ?   Effort: Pulmonary effort is normal. No respiratory distress.  ?   Breath sounds: No wheezing.  ?Abdominal:  ?   Palpations: Abdomen is soft.  ?   Tenderness: There is no abdominal tenderness. There is no right CVA tenderness, left CVA tenderness, guarding or rebound.  ?Musculoskeletal:  ?   Cervical back: Normal range of motion.  ?   Right lower leg: No edema.  ?   Left lower leg: No edema.  ?Skin: ?   General: Skin is warm and dry.  ?   Capillary Refill: Capillary refill takes less than 2 seconds.  ?Neurological:  ?   Mental Status: He is alert. Mental status is at baseline.  ?Psychiatric:     ?   Mood and Affect: Mood normal.     ?   Behavior: Behavior normal.  ? ? ?ED Results / Procedures /  Treatments   ?Labs ?(all labs ordered are listed, but only abnormal results are displayed) ?Labs Reviewed  ?BASIC METABOLIC PANEL - Abnormal; Notable for the following components:  ?    Result Value  ? Potassium 3.4 (*)   ? Glucose, Bld 120 (*)   ? Creatinine, Ser 1.40 (*)   ? GFR, Estimated 56 (*)   ? All other components within normal limits  ?HEPATIC FUNCTION PANEL - Abnormal; Notable for the following components:  ? Total Protein 6.4 (*)   ? All other components within normal limits  ?CBC WITH DIFFERENTIAL/PLATELET - Abnormal; Notable for the following components:  ? Neutro Abs 8.0 (*)   ? All other components within normal limits  ? ? ?EKG ?None ? ?Radiology ?DG Abdomen 1 View ? ?Result Date: 01/09/2022 ?CLINICAL DATA:  Urolithiasis EXAM: ABDOMEN - 1 VIEW COMPARISON:  None. FINDINGS: The bowel gas pattern is normal. No radio-opaque calculi or other significant radiographic abnormality are seen. Calcifications in the pelvis, likely due to phleboliths. IMPRESSION: No visible nephrolithiasis or urolithiasis, although overlying bowel gas limits evaluation. Electronically Signed   By: Yetta Glassman M.D.   On: 01/09/2022 09:27   ? ?Procedures ?Procedures  ? ? ?Medications Ordered in ED ?Medications  ?sodium chloride 0.9 % bolus 1,000 mL (0 mLs Intravenous Stopped 01/09/22 0942)  ? ? ?ED Course/ Medical Decision Making/ A&P ?Clinical Course as of 01/10/22 0756  ?Thu Jan 09, 2022  ?0826 75 mL of urine in bladder by bladderscan ? ?CBC unremarkable ?BMP  [WF]  ?0839 Patient's pain completely resolved at this time. [WF]  ?0844 Discussed with Dr. Junious Silk of urology who will expedite patient's follow-up. ?Recommended KUB but patient does not necessarily require a renal CT from his standpoint.  From the ER standpoint patient's pain is improved tolerating p.o. has been hydrated pain is now 0.  Will obtain KUB still awaiting urinalysis plan to discharge home afterwards. [WF]  ?  ?Clinical Course User Index ?[WF] Tedd Sias, [place]  ? ?                        ?Medical Decision Making ?Amount and/or Complexity of Data Reviewed ?Labs: ordered. ?Radiology: ordered. ? ?Risk ?Prescription drug management. ? ? ?This patient presents to the ED for concern of flank pain, this involves a number of treatment options, and is a complaint that carries with it a high risk of complications and morbidity.  The differential diagnosis includes The causes of  generalized abdominal pain include but are not limited to AAA, mesenteric ischemia, appendicitis, diverticulitis, DKA, gastritis, gastroenteritis, AMI, nephrolithiasis, pancreatitis, peritonitis, adrenal insufficiency,lead poisoning, iron toxicity, intestinal ischemia, constipation, UTI,SBO/LBO, splenic rupture, biliary disease, IBD, IBS, PUD, or hepatitis. ? ? ?Co morbidities: ?Discussed in HPI ? ? ?Brief History: ? ?Patient with known kidney stone with some recurrence of pain this morning seems to be right-sided consistent with prior found kidney stone.  Already followed by urology.  Pain control and discussion with urology ? ?Physical exam without any abdominal tenderness. ? ?EMR reviewed including pt PMHx, past surgical history and past visits to ER.  ? ?See HPI for more details ? ? ?Lab Tests: ? ? ?I ordered and independently interpreted labs. Labs notable for BMP with no significant changes in creatinine.  Electrolytes generally normal.  LFTs unremarkable.  CBC without leukocytosis or anemia. ? ? ?Imaging Studies: ? ?NAD. I personally reviewed all imaging studies and no acute abnormality found. I agree with radiology interpretation.  I personally reviewed x-ray.  Bowel gas is obstructing close visualization of any stones.  I do not appreciate any stone on x-ray.  This is a low sensitivity study however.  I personally reviewed CT renal stone study from 215.  Right-sided hydronephrosis and hydroureter secondary to 4 mm proximal right ureteral calculus. ? ? ? ?Cardiac  Monitoring: ? ?NA ?NA ? ? ?Medicines ordered: ? ?I ordered medication including fentanyl, Zofran, 1 L normal saline for pain and hydration. ?Reevaluation of the patient after these medicines showed that the patient resolved ?I have reviewed

## 2022-01-09 NOTE — ED Notes (Signed)
Bladder scan performed 30m detected. PA notified  ?

## 2022-01-13 ENCOUNTER — Encounter: Payer: Self-pay | Admitting: Cardiology

## 2022-01-13 ENCOUNTER — Other Ambulatory Visit: Payer: Self-pay | Admitting: Urology

## 2022-01-13 ENCOUNTER — Other Ambulatory Visit (HOSPITAL_COMMUNITY): Payer: Self-pay | Admitting: Urology

## 2022-01-13 DIAGNOSIS — N200 Calculus of kidney: Secondary | ICD-10-CM

## 2022-01-16 ENCOUNTER — Encounter: Payer: Self-pay | Admitting: Cardiology

## 2022-01-17 ENCOUNTER — Encounter: Payer: Self-pay | Admitting: Cardiology

## 2022-01-17 ENCOUNTER — Other Ambulatory Visit: Payer: Self-pay

## 2022-01-17 ENCOUNTER — Other Ambulatory Visit: Payer: Self-pay | Admitting: Urology

## 2022-01-17 MED ORDER — METOPROLOL SUCCINATE ER 50 MG PO TB24: ORAL_TABLET | ORAL | 3 refills | Status: DC

## 2022-01-17 NOTE — Telephone Encounter (Signed)
Per Dr Harriet Masson:  I was able to speak with the patient. I asked him to stop the lopressor. We will start Toprol XL '50mg'$  in the morning and 25 mg in the night. ?

## 2022-01-17 NOTE — Telephone Encounter (Signed)
Prescription sent to pharmacy by another nurse. Medication list updated. ?

## 2022-01-22 ENCOUNTER — Encounter: Payer: Self-pay | Admitting: Cardiology

## 2022-01-23 ENCOUNTER — Encounter: Payer: Self-pay | Admitting: Cardiology

## 2022-01-23 ENCOUNTER — Ambulatory Visit: Payer: PPO | Admitting: Cardiology

## 2022-01-23 VITALS — BP 120/68 | HR 58 | Ht 69.0 in | Wt 176.2 lb

## 2022-01-23 DIAGNOSIS — I480 Paroxysmal atrial fibrillation: Secondary | ICD-10-CM

## 2022-01-23 NOTE — Patient Instructions (Signed)
?Medication Instructions:  ?Your physician recommends that you continue on your current medications as directed. Please refer to the Current Medication list given to you today. ? ?*If you need a refill on your cardiac medications before your next appointment, please call your pharmacy* ? ? ?Lab Work: ?Pre procedure labs ___________:  BMP & CBC ? ?If you have labs (blood work) drawn today and your tests are completely normal, you will receive your results only by: ?MyChart Message (if you have MyChart) OR ?A paper copy in the mail ?If you have any lab test that is abnormal or we need to change your treatment, we will call you to review the results. ? ? ?Testing/Procedures: ?Your physician has requested that you have cardiac CT within 7 days PRIOR to your ablation. Cardiac computed tomography (CT) is a painless test that uses an x-ray machine to take clear, detailed pictures of your heart.  Please follow instruction below located under "other instructions". ?You will get a call from our office to schedule the date for this test. ? ?Your physician has recommended that you have an ablation. Catheter ablation is a medical procedure used to treat some cardiac arrhythmias (irregular heartbeats). During catheter ablation, a long, thin, flexible tube is put into a blood vessel in your groin (upper thigh), or neck. This tube is called an ablation catheter. It is then guided to your heart through the blood vessel. Radio frequency waves destroy small areas of heart tissue where abnormal heartbeats may cause an arrhythmia to start. Please follow instruction letter given to you today. ? ? ?Follow-Up: ?At [facility], you and your health needs are our priority.  As part of our continuing mission to provide you with exceptional heart care, we have created designated Provider Care Teams.  These Care Teams include your primary Cardiologist (physician) and Advanced Practice Providers (APPs -  Physician Assistants and Nurse  Practitioners) who all work together to provide you with the care you need, when you need it. ? ?We recommend signing up for the patient portal called "MyChart".  Sign up information is provided on this After Visit Summary.  MyChart is used to connect with patients for Virtual Visits (Telemedicine).  Patients are able to view lab/test results, encounter notes, upcoming appointments, etc.  Non-urgent messages can be sent to your provider as well.   ?To learn more about what you can do with MyChart, go to [email or web address].   ? ?Your next appointment:   ?1 month(s) after your ablation ? ?The format for your next appointment:   ?In Person ? ?Provider:   ?AFib clinic ? ? ?Thank you for choosing [facility]!! ? ? ?Trinidad Curet, RN ?[phone] ? ? ? ?Other Instructions ? ? ? ?Your cardiac CT will be scheduled at:  ?[facility] ?[address] ?[place], [place] [address] ?[phone] ? ?Please arrive at the [facility] Entrance (Entrance C2) of [facility] 30 minutes prior to test start time. ?You can use the FREE valet parking offered at entrance C (encouraged to control the heart rate for the test)  ?Proceed to the [facility] Radiology Department (first floor) to check-in and test prep. ? ?All radiology patients and guests should use entrance C2 at [facility], accessed from [facility], even though the hospital's physical address listed is [address]. ? ? ? ?Please follow these instructions carefully (unless otherwise directed): ? ?Hold all erectile dysfunction medications at least 3 days (72 hrs) prior to  test. ? ?On the Night Before the Test: ?Be sure to Drink plenty of water. ?Do not consume any caffeinated/decaffeinated beverages or chocolate 12 hours prior to your test. ?Do not take any antihistamines 12 hours prior to your test. ? ?On the Day of the Test: ?Drink plenty of water until 1 hour prior to the test. ?Do not eat any food 4  hours prior to the test. ?You may take your regular medications prior to the test.  ?Take metoprolol (Lopressor) two hours prior to test. ?HOLD Furosemide/Hydrochlorothiazide morning of the test. ?     ?After the Test: ?Drink plenty of water. ?After receiving IV contrast, you may experience a mild flushed feeling. This is normal. ?On occasion, you may experience a mild rash up to 24 hours after the test. This is not dangerous. If this occurs, you can take Benadryl 25 mg and increase your fluid intake. ?If you experience trouble breathing, this can be serious. If it is severe call 911 IMMEDIATELY. If it is mild, please call our office. ?If you take any of these medications: Glipizide/Metformin, Avandament, Glucavance, please do not take 48 hours after completing test unless otherwise instructed. ? ?We will call to schedule your test 2-4 weeks out understanding that some insurance companies will need an authorization prior to the service being performed.  ? ?For non-scheduling related questions, please contact the cardiac imaging nurse navigator should you have any questions/concerns: ?Marchia Bond, Cardiac Imaging Nurse Navigator ?Gordy Clement, Cardiac Imaging Nurse Navigator ?[facility] ?Direct Office Dial: [phone]  ? ?For scheduling needs, including cancellations and rescheduling, please call [place], [phone]. ? ? ? ? ? Electrophysiology/Ablation Procedure Instructions ?  ?You are scheduled for a(n)  ablation on 04/29/2022 with Dr. Allegra Lai. ?  ?1.   Pre procedure testing- ?            A.  LAB WORK --- On ________________----  for your pre procedure blood work.  You can stop by the [facility] office, and you do NOT need to be fasting. ?  ?On the day of your procedure 04/29/2022 you will go to [facility] [phone]) at 8:30 am.  Dennis Bast will go to the main entrance A [employer]) and enter where the [employer] are.  Your driver will drop you off and you  will head down the hallway to ADMITTING.  You may have one support person come in to the hospital with you.  They will be asked to wait in the waiting room. It is OK to have someone drop you off and come back when you are ready to be discharged. ?  ?3.   Do not eat or drink after midnight prior to your procedure. ?  ?4.   On the morning of your procedure do NOT take any medication. ?Do not miss any doses of your blood thinner prior to the morning of your procedure or your procedure will need to be rescheduled. ?  ?5.  Plan for an overnight stay but you may be discharged after your procedure, if you use your phone frequently bring your phone charger. If you are discharged after your procedure you will need someone to drive you home and be with you for 24 hours after your procedure. ?  ?6. You will follow up with the AFIB clinic 4 weeks after your procedure.  You will follow up with Dr. Curt Bears  3 months after your procedure.  These appointments will be made for  you. ?  ?7. FYI: For your safety, and to allow [place] to monitor your vital signs accurately during the surgery/procedure we request that if you have artificial nails, gel coating, SNS etc. Please have those removed prior to your surgery/procedure. Not having the nail coverings /polish removed may result in cancellation or delay of your surgery/procedure. ? ?* If you have ANY questions please call the office [phone] and ask for Abagayle Klutts RN or send me a MyChart message ?  ?* Occasionally, EP Studies and ablations can become lengthy.  Please make your family aware of this before your procedure starts.  Average time ranges from 2-8 hours for EP studies/ablations.  Your physician will call your family after the procedure with the results.                                 ? ? ? ? ? ?Cardiac Ablation ?Cardiac ablation is a procedure to destroy (ablate) some heart tissue that is sending bad signals. These bad signals cause problems in heart rhythm. ?The heart has  many areas that make these signals. If there are problems in these areas, they can make the heart beat in a way that is not normal. Destroying some tissues can help make the heart rhythm normal. ?Tell your doctor

## 2022-01-23 NOTE — Progress Notes (Signed)
? ?Electrophysiology Office Note ? ? ?Date:  01/23/2022  ? ?ID:  Arthur Barrett, DOB 1956/07/28, MRN [id number] ? ?PCP:  Shon Baton, MD  ?Cardiologist:  Tobb ?Primary Electrophysiologist:  Baran Kuhrt Meredith Leeds, MD   ? ?Chief Complaint: AF ?  ?History of Present Illness: ?Arthur Barrett is a 66 y.o. male who is being seen today for the evaluation of AF at the request of Tobb, Kardie, DO. Presenting today for electrophysiology evaluation. ? ?He has a history significant for atrial fibrillation.  He was admitted February 2023 to [facility] and was noted to be in atrial fibrillation at that time.  He had a TEE and cardioversion.  He was again seen in cardiology clinic 12/18/2021 and was experiencing intermittent palpitations.  He was started on flecainide.  He had a Myoview that showed an infarct but no evidence of ischemia. ? ?Today, he denies symptoms of chest pain, shortness of breath, orthopnea, PND, lower extremity edema, claudication, dizziness, presyncope, syncope, bleeding, or neurologic sequela. The patient is tolerating medications without difficulties.  Being on the amiodarone, he has done well.  Prior to that, he was having intermittent palpitations.  He brings in recordings from his cardia mobile that show episodes of atrial fibrillation. ? ? ?Past Medical History:  ?Diagnosis Date  ? Atrial fibrillation ([place])   ? ?Past Surgical History:  ?Procedure Laterality Date  ? APPENDECTOMY    ? CARDIOVERSION N/A 12/06/2021  ? Procedure: CARDIOVERSION;  Surgeon: Pixie Casino, MD;  Location: [facility] ENDOSCOPY;  Service: Cardiovascular;  Laterality: N/A;  ? colonscopy     ? LUMBAR LAMINECTOMY/DECOMPRESSION MICRODISCECTOMY Left 01/31/2015  ? Procedure: MICRO LUMBAR DECOMPRESSION LUMBAR FIVE TO SACRAL ONE ON LEFT;  Surgeon: Susa Day, MD;  Location: [facility] ORS;  Service: Orthopedics;  Laterality: Left;  ? TEE WITHOUT CARDIOVERSION N/A 12/06/2021  ? Procedure: TRANSESOPHAGEAL ECHOCARDIOGRAM (TEE);  Surgeon: Pixie Casino, MD;  Location: [place];  Service: Cardiovascular;  Laterality: N/A;  ? ? ? ?Current Outpatient Medications  ?Medication Sig Dispense Refill  ? amiodarone (PACERONE) 200 MG tablet Take 1 tablet (200 mg total) by mouth daily. 90 tablet 3  ? apixaban (ELIQUIS) 5 MG TABS tablet Take 1 tablet (5 mg total) by mouth 2 (two) times daily. 60 tablet 2  ? atorvastatin (LIPITOR) 20 MG tablet Take 1 tablet (20 mg total) by mouth at bedtime. 30 tablet 2  ? metoprolol succinate (TOPROL-XL) 50 MG 24 hr tablet Take 1 tablet (50 mg total) by mouth in the morning AND 0.5 tablets (25 mg total) every evening. Take with or immediately following a meal.. 45 tablet 3  ? mirabegron ER (MYRBETRIQ) 25 MG TB24 tablet Take 1 tablet (25 mg total) by mouth daily. 90 tablet 3  ? ondansetron (ZOFRAN-ODT) 4 MG disintegrating tablet Take 1 tablet (4 mg total) by mouth every 8 (eight) hours as needed for nausea or vomiting. 20 tablet 0  ? oxyCODONE-acetaminophen (PERCOCET/ROXICET) 5-325 MG tablet Take 1 tablet by mouth every 6 (six) hours as needed for severe pain. 4 tablet 0  ? tamsulosin (FLOMAX) 0.4 MG CAPS capsule Take 1 capsule (0.4 mg total) by mouth daily after supper. 30 capsule 2  ? ?No current facility-administered medications for this visit.  ? ? ?Allergies:   Patient has no known allergies.  ? ?Social History:  The patient  reports that he has never smoked. He has never used smokeless tobacco. He reports that he does not drink alcohol and does not use drugs.  ? ?  Family History:  The patient's Family history is unknown by patient.  ? ? ?ROS:  Please see the history of present illness.   Otherwise, review of systems is positive for none.   All other systems are reviewed and negative.  ? ? ?PHYSICAL EXAM: ?VS:  BP 120/68   Pulse (!) 58   Ht '5\' 9"'$  (1.753 m)   Wt 176 lb 3.2 oz (79.9 kg)   SpO2 97%   BMI 26.02 kg/m?  , BMI Body mass index is 26.02 kg/m?. ?GEN: Well nourished, well developed, in no acute distress  ?HEENT:  normal  ?Neck: no JVD, carotid bruits, or masses ?Cardiac: RRR; no murmurs, rubs, or gallops,no edema  ?Respiratory:  clear to auscultation bilaterally, normal work of breathing ?GI: soft, nontender, nondistended, + BS ?MS: no deformity or atrophy  ?Skin: warm and dry ?Neuro:  Strength and sensation are intact ?Psych: euthymic mood, full affect ? ?EKG:  EKG is ordered today. ?Personal review of the ekg ordered shows sinus rhythm, PACs, rate 58 ? ?Recent Labs: ?12/04/2021: TSH 1.355 ?12/06/2021: Magnesium 2.0 ?01/09/2022: ALT 31; BUN 14; Creatinine, Ser 1.40; Hemoglobin 14.8; Platelets 162; Potassium 3.4; Sodium 139  ? ? ?Lipid Panel  ?   ?Component Value Date/Time  ? CHOL 178 12/05/2021 0331  ? TRIG 80 12/05/2021 0331  ? HDL 38 (L) 12/05/2021 0331  ? CHOLHDL 4.7 12/05/2021 0331  ? VLDL 16 12/05/2021 0331  ? [place] 124 (H) 12/05/2021 0331  ? ? ? ?Wt Readings from Last 3 Encounters:  ?01/23/22 176 lb 3.2 oz (79.9 kg)  ?01/09/22 171 lb 15.3 oz (78 kg)  ?12/27/21 172 lb (78 kg)  ?  ? ? ?Other studies Reviewed: ?Additional studies/ records that were reviewed today include: TTE 12/04/21  ?Review of the above records today demonstrates:  ? 1. Left ventricular ejection fraction, by estimation, is 40 to 45%. The  ?left ventricle has mildly decreased function. The left ventricle has no  ?regional wall motion abnormalities. There is mild left ventricular  ?hypertrophy. Left ventricular diastolic  ?function could not be evaluated.  ? 2. Right ventricular systolic function is mildly reduced. The right  ?ventricular size is normal.  ? 3. Left atrial size was mild to moderately dilated.  ? 4. Right atrial size was mild to moderately dilated.  ? 5. The mitral valve is normal in structure. Trivial mitral valve  ?regurgitation. No evidence of mitral stenosis.  ? 6. The aortic valve is tricuspid. There is mild calcification of the  ?aortic valve. Aortic valve regurgitation is not visualized. No aortic  ?stenosis is present.  ? 7. Aortic  dilatation noted. There is mild dilatation of the ascending  ?aorta, measuring 40 mm.  ? 8. The inferior vena cava is normal in size with greater than 50%  ?respiratory variability, suggesting right atrial pressure of 3 mmHg.  ? 9. EF hard to assess in the setting of AFL. Appears 40-45%.  ? ?Myoview 12/24/2021 ?Fixed perfusion defect at apex with hypokinesis consistent with infarct ?Fixed inferior perfusion defect with normal wall motion suggests artifact ?Mild systolic dysfunction (EF 83%) ?Intermediate risk study due to systolic dysfunction.  No ischemia. ? ?ASSESSMENT AND PLAN: ? ?1.  Paroxysmal atrial fibrillation/typical atrial flutter: Currently on amiodarone 200 mg daily, Toprol-XL 50 mg daily, Eliquis 5 mg twice daily.  CHA2DS2-VASc of at least 2.  He would like to get off of his amiodarone.  Due to that, we Edynn Gillock plan for ablation. ? ?Risk, benefits, and alternatives to  EP study and radiofrequency ablation for afib were also discussed in detail today. These risks include but are not limited to stroke, bleeding, vascular damage, tamponade, perforation, damage to the esophagus, lungs, and other structures, pulmonary vein stenosis, worsening renal function, and death. The patient understands these risk and wishes to proceed.  We Kameelah Minish therefore proceed with catheter ablation at the next available time.  Carto, ICE, anesthesia are requested for the procedure.  Cailynn Bodnar also obtain CT PV protocol prior to the procedure to exclude LAA thrombus and further evaluate atrial anatomy.  ? ?2.  Systolic heart failure: Unclear as to the duration of his heart failure.  Currently on Toprol-XL 50 mg.  Once he has been in sinus rhythm for some time, we Earnestine Shipp plan for repeat echo. ? ?Case discussed with primary cardiology ? ?Current medicines are reviewed at length with the patient today.   ?The patient does not have concerns regarding his medicines.  The following changes were made today:  none ? ?Labs/ tests ordered today include:   ?Orders Placed This Encounter  ?Procedures  ? EKG 12-Lead  ? ? ? ?Disposition:   FU with Carnie Bruemmer 3 months ? ?Signed, ?Krystyn Picking Meredith Leeds, MD  ?01/23/2022 4:57 PM    ? ?[facility] ?[place]

## 2022-01-29 ENCOUNTER — Other Ambulatory Visit (HOSPITAL_COMMUNITY): Payer: Self-pay

## 2022-01-30 ENCOUNTER — Encounter (HOSPITAL_BASED_OUTPATIENT_CLINIC_OR_DEPARTMENT_OTHER): Payer: Self-pay | Admitting: Urology

## 2022-01-30 ENCOUNTER — Other Ambulatory Visit: Payer: Self-pay

## 2022-01-30 ENCOUNTER — Telehealth: Payer: Self-pay | Admitting: Cardiology

## 2022-01-30 NOTE — Telephone Encounter (Signed)
? ?  Primary Cardiologist: Berniece Salines, DO ? ?Chart reviewed as part of pre-operative protocol coverage. Given past medical history and time since last visit, based on ACC/AHA guidelines, GIANLUCA CHHIM would be at acceptable risk for the planned procedure without further cardiovascular testing.  ? ?Guidelines for holding Eliquis are as follows: ? ?CHA2DS2-VASc Score = 2  ?This indicates a 2.2% annual risk of stroke. ?The patient's score is based upon: ?CHF History: 0 ?HTN History: 0 ?Diabetes History: 0 ?Stroke History: 0 ?Vascular Disease History: 1 ?Age Score: 1 ?Gender Score: 0 ?  ?Underwent DCCV on 12/06/21, completion of 4 weeks of uninterrupted coagulation achieved 01/03/22.  ?  ?CrCl 19m/min ?Platelet count 182K ?  ?Per office protocol, patient can hold Eliquis for 3 days prior to procedure as requested. Procedure date is scheduled > 30 days after cardioversion.   ?  ?I will route this recommendation to the requesting party via Epic fax function and remove from pre-op pool. ? ?Please call with questions. ? ?MEmmaline Life NP-C ? ?  ?01/30/2022, 5:13 PM ?[facility]?[id number] ?Office [phone] ([phone]? ? ?

## 2022-01-30 NOTE — Telephone Encounter (Signed)
Primary Cardiologist:Kardie Tobb, DO ? ?Chart reviewed as part of pre-operative protocol coverage for [employer]. He was previously cleared for prostate biopsy which was postponed due to recent cardioversion and need for continuous anticoagulation for 30 days post-procedure. That time concluded on 01/03/22.  ? ?Clearance request now states: Prostate biopsy plus cystoscopy, ureteroscopy, laser lithotripsy, and stent placement. ? ?This message will be routed to pharmacy pool  for input on holding anticoagulant agent as requested below so that this information is available at time of patient's appointment.  ? ? ?Emmaline Life, NP-C ? ?  ?01/30/2022, 9:59 AM ?[facility] ?[address] ?Office [phone] Fax [phone] ? ?

## 2022-01-30 NOTE — Progress Notes (Addendum)
Spoke w/ via phone for pre-op interview---pt ?Lab needs dos----   cbc with dif, hepatic function, bmet 01-09-2022            ?Lab results------see below ?COVID test -----patient states asymptomatic no test needed ?Arrive at -------1130 am 02-03-2022 ?NPO after MN NO Solid Food.  Clear liquids from MN until---1030 am ?Med rec completed ?Medications to take morning of surgery -----metoprolol, mybetriq ?Diabetic medication -----n/a ?Patient instructed no nail polish to be worn day of surgery ?Patient instructed to bring photo id and insurance card day of surgery ?Patient aware to have Driver (ride ) / caregiver  wife Thersa Salt will stay   for 24 hours after surgery  ?Patient Special Instructions -----none ?Pre-Op special Istructions -----none ?Patient verbalized understanding of instructions that were given at this phone interview. ?Patient denies shortness of breath, chest pain, fever, cough at this phone interview.  ? ?Myocardial perfusion imagine 12-26-2021 epic ?Echo  12-04-2021 ef 40 to 45 % ?Tee  with cardioversion  ef 50 to 55 % 12-06-2021 ?Stress test 12-24-2021 epic ?Ekg 01-23-2022 chart/epic ?Cardiac clearance dr Godfrey Pick tobb 01-30-2022 chart/epic ?Clearance to stop eliquis 3 days before surgery (pt aware) megan supple rph 01-30-2022 chart/epic ? ?Spoke with dr c Christella Hartigan mda and reviewed patient medical history and pt is ok for 02-03-2022 surgery at [place] per dr c Christella Hartigan mda. ? ?

## 2022-01-30 NOTE — Telephone Encounter (Signed)
Rec remains the same as in 12/23/21 clearance, ok to hold Eliquis for 3 days prior. > 30 days out from DCCV. Does have ablation scheduled now but it's not until July so urology procedure timing is fine. ?

## 2022-01-30 NOTE — Telephone Encounter (Signed)
? ? ?  Pre-operative Risk Assessment  ?  ?Patient Name: Arthur Barrett  ?DOB: 08-Oct-1956 ?MRN: [id number]  ? ?  ? ?Request for Surgical Clearance   ? ?Procedure:   prostate biopsy, cystoscopy ureteroscopy laser lithotripsy and stent placement   ? ?Date of Surgery:  Clearance 02/03/22                              ?   ?Surgeon:  Dr. Miachel Roux ?Surgeon's Group or Practice Name:  [facility] ?Phone number:  [phone] ext [phone] ?Fax number:  [phone] ?  ?Type of Clearance Requested:   ?- Medical  ?- Pharmacy:  Hold Apixaban (Eliquis) 3 days prior ?  ?Type of Anesthesia:  General  ?  ?Additional requests/questions:   per [name], they already got clearance last 01/02/22, however, the pt's surgery was changed to 02/03/22 and anesthesiologist wants to get new clearance with the new surgery date. Also, they added procedure to the pt's surgery. pt is getting prostate biopsy plus cystoscopy, ureteroscopy, laser lithotripsy and stent placement    ? ?Signed, ?[place]   ?01/30/2022, 8:59 AM   ?

## 2022-02-03 ENCOUNTER — Encounter (HOSPITAL_BASED_OUTPATIENT_CLINIC_OR_DEPARTMENT_OTHER): Payer: Self-pay | Admitting: Urology

## 2022-02-03 ENCOUNTER — Ambulatory Visit (HOSPITAL_COMMUNITY)
Admission: RE | Admit: 2022-02-03 | Discharge: 2022-02-03 | Disposition: A | Discharge status: Home / Self Care | Payer: PPO | Source: Ambulatory Visit | Attending: Urology | Admitting: Urology

## 2022-02-03 ENCOUNTER — Ambulatory Visit (HOSPITAL_BASED_OUTPATIENT_CLINIC_OR_DEPARTMENT_OTHER): Payer: PPO | Admitting: Anesthesiology

## 2022-02-03 ENCOUNTER — Ambulatory Visit (HOSPITAL_BASED_OUTPATIENT_CLINIC_OR_DEPARTMENT_OTHER)
Admission: RE | Admit: 2022-02-03 | Discharge: 2022-02-03 | Disposition: A | Discharge status: Home / Self Care | Payer: PPO | Attending: Urology | Admitting: Urology

## 2022-02-03 ENCOUNTER — Other Ambulatory Visit (HOSPITAL_COMMUNITY): Payer: Self-pay

## 2022-02-03 ENCOUNTER — Other Ambulatory Visit: Payer: Self-pay

## 2022-02-03 ENCOUNTER — Encounter (HOSPITAL_BASED_OUTPATIENT_CLINIC_OR_DEPARTMENT_OTHER)
Admission: RE | Disposition: A | Discharge status: Home / Self Care | Payer: Self-pay | Source: Home / Self Care | Attending: Urology

## 2022-02-03 DIAGNOSIS — C61 Malignant neoplasm of prostate: Secondary | ICD-10-CM | POA: Diagnosis not present

## 2022-02-03 DIAGNOSIS — N202 Calculus of kidney with calculus of ureter: Secondary | ICD-10-CM

## 2022-02-03 DIAGNOSIS — N402 Nodular prostate without lower urinary tract symptoms: Secondary | ICD-10-CM

## 2022-02-03 DIAGNOSIS — N132 Hydronephrosis with renal and ureteral calculous obstruction: Secondary | ICD-10-CM | POA: Insufficient documentation

## 2022-02-03 DIAGNOSIS — N200 Calculus of kidney: Secondary | ICD-10-CM

## 2022-02-03 DIAGNOSIS — Z9049 Acquired absence of other specified parts of digestive tract: Secondary | ICD-10-CM | POA: Diagnosis not present

## 2022-02-03 DIAGNOSIS — N201 Calculus of ureter: Secondary | ICD-10-CM

## 2022-02-03 DIAGNOSIS — Z7901 Long term (current) use of anticoagulants: Secondary | ICD-10-CM | POA: Insufficient documentation

## 2022-02-03 DIAGNOSIS — I700 Atherosclerosis of aorta: Secondary | ICD-10-CM | POA: Insufficient documentation

## 2022-02-03 DIAGNOSIS — I4891 Unspecified atrial fibrillation: Secondary | ICD-10-CM | POA: Insufficient documentation

## 2022-02-03 HISTORY — DX: Personal history of urinary calculi: Z87.442

## 2022-02-03 HISTORY — DX: Presence of spectacles and contact lenses: Z97.3

## 2022-02-03 HISTORY — DX: Palpitations: R00.2

## 2022-02-03 HISTORY — DX: Personal history of (healed) traumatic fracture: Z87.81

## 2022-02-03 HISTORY — PX: PROSTATE BIOPSY: SHX241

## 2022-02-03 HISTORY — PX: CYSTOSCOPY/URETEROSCOPY/HOLMIUM LASER/STENT PLACEMENT: SHX6546

## 2022-02-03 SURGERY — CYSTOSCOPY/URETEROSCOPY/HOLMIUM LASER/STENT PLACEMENT
Anesthesia: General | Site: Ureter | Laterality: Right

## 2022-02-03 MED ORDER — SODIUM CHLORIDE 0.9 % IV SOLN
INTRAVENOUS | Status: AC
  Filled 2022-02-03: qty 100

## 2022-02-03 MED ORDER — CEPHALEXIN 500 MG PO CAPS
500.0000 mg | ORAL_CAPSULE | Freq: Two times a day (BID) | ORAL | 0 refills | Status: AC
Start: 2022-02-03 — End: 2022-02-06
  Filled 2022-02-03: qty 6, 3d supply, fill #0

## 2022-02-03 MED ORDER — LIDOCAINE HCL 2 % IJ SOLN
INTRAMUSCULAR | Status: DC | PRN
Start: 2022-02-03 — End: 2022-02-03
  Administered 2022-02-03: 5 mL

## 2022-02-03 MED ORDER — LIDOCAINE 2% (20 MG/ML) 5 ML SYRINGE
INTRAMUSCULAR | Status: DC | PRN
  Administered 2022-02-03: 60 mg via INTRAVENOUS

## 2022-02-03 MED ORDER — FENTANYL CITRATE (PF) 100 MCG/2ML IJ SOLN
INTRAMUSCULAR | Status: DC | PRN
  Administered 2022-02-03: 50 ug via INTRAVENOUS

## 2022-02-03 MED ORDER — PROPOFOL 10 MG/ML IV BOLUS
INTRAVENOUS | Status: DC | PRN
  Administered 2022-02-03: 130 mg via INTRAVENOUS

## 2022-02-03 MED ORDER — SODIUM CHLORIDE 0.9 % IR SOLN
Status: DC | PRN
Start: 2022-02-03 — End: 2022-02-03
  Administered 2022-02-03: 3000 mL

## 2022-02-03 MED ORDER — ONDANSETRON HCL 4 MG/2ML IJ SOLN
INTRAMUSCULAR | Status: DC | PRN
  Administered 2022-02-03: 4 mg via INTRAVENOUS

## 2022-02-03 MED ORDER — ONDANSETRON HCL 4 MG/2ML IJ SOLN
INTRAMUSCULAR | Status: AC
  Filled 2022-02-03: qty 2

## 2022-02-03 MED ORDER — EPHEDRINE 5 MG/ML INJ
INTRAVENOUS | Status: AC
  Filled 2022-02-03: qty 5

## 2022-02-03 MED ORDER — FENTANYL CITRATE (PF) 100 MCG/2ML IJ SOLN
25.0000 ug | INTRAMUSCULAR | Status: DC | PRN
  Administered 2022-02-03 (×2): 25 ug via INTRAVENOUS

## 2022-02-03 MED ORDER — OXYCODONE-ACETAMINOPHEN 5-325 MG PO TABS
1.0000 | ORAL_TABLET | ORAL | 0 refills | Status: DC | PRN
  Filled 2022-02-03: qty 12, 2d supply, fill #0

## 2022-02-03 MED ORDER — FENTANYL CITRATE (PF) 100 MCG/2ML IJ SOLN
INTRAMUSCULAR | Status: AC
  Filled 2022-02-03: qty 2

## 2022-02-03 MED ORDER — MIDAZOLAM HCL 2 MG/2ML IJ SOLN
INTRAMUSCULAR | Status: AC
  Filled 2022-02-03: qty 2

## 2022-02-03 MED ORDER — DOCUSATE SODIUM 100 MG PO CAPS
100.0000 mg | ORAL_CAPSULE | Freq: Every day | ORAL | 0 refills | Status: DC | PRN
  Filled 2022-02-03: qty 30, 30d supply, fill #0

## 2022-02-03 MED ORDER — LIDOCAINE HCL (PF) 2 % IJ SOLN
INTRAMUSCULAR | Status: AC
  Filled 2022-02-03: qty 5

## 2022-02-03 MED ORDER — EPHEDRINE SULFATE-NACL 50-0.9 MG/10ML-% IV SOSY
PREFILLED_SYRINGE | INTRAVENOUS | Status: DC | PRN
  Administered 2022-02-03 (×2): 15 mg via INTRAVENOUS
  Administered 2022-02-03: 10 mg via INTRAVENOUS
  Administered 2022-02-03: 15 mg via INTRAVENOUS

## 2022-02-03 MED ORDER — CEFTRIAXONE SODIUM 2 G IJ SOLR
INTRAMUSCULAR | Status: AC
  Filled 2022-02-03: qty 20

## 2022-02-03 MED ORDER — PROPOFOL 10 MG/ML IV BOLUS
INTRAVENOUS | Status: AC
Start: 2022-02-03 — End: ?
  Filled 2022-02-03: qty 20

## 2022-02-03 MED ORDER — ACETAMINOPHEN 500 MG PO TABS
1000.0000 mg | ORAL_TABLET | Freq: Once | ORAL | Status: AC
  Administered 2022-02-03: 1000 mg via ORAL

## 2022-02-03 MED ORDER — ACETAMINOPHEN 500 MG PO TABS
ORAL_TABLET | ORAL | Status: AC
  Filled 2022-02-03: qty 2

## 2022-02-03 MED ORDER — SODIUM CHLORIDE 0.9 % IV SOLN
2.0000 g | Freq: Once | INTRAVENOUS | Status: AC
  Administered 2022-02-03: 2 g via INTRAVENOUS

## 2022-02-03 MED ORDER — GENTAMICIN SULFATE 40 MG/ML IJ SOLN
5.0000 mg/kg | Freq: Once | INTRAMUSCULAR | Status: AC
  Administered 2022-02-03: 400 mg via INTRAVENOUS
  Filled 2022-02-03: qty 10

## 2022-02-03 MED ORDER — IOHEXOL 300 MG/ML  SOLN
INTRAMUSCULAR | Status: DC | PRN
  Administered 2022-02-03: 7 mL

## 2022-02-03 MED ORDER — 0.9 % SODIUM CHLORIDE (POUR BTL) OPTIME
TOPICAL | Status: DC | PRN
  Administered 2022-02-03: 500 mL

## 2022-02-03 MED ORDER — DEXAMETHASONE SODIUM PHOSPHATE 10 MG/ML IJ SOLN
INTRAMUSCULAR | Status: DC | PRN
  Administered 2022-02-03: 5 mg via INTRAVENOUS

## 2022-02-03 MED ORDER — DEXAMETHASONE SODIUM PHOSPHATE 10 MG/ML IJ SOLN
INTRAMUSCULAR | Status: AC
  Filled 2022-02-03: qty 1

## 2022-02-03 MED ORDER — SODIUM CHLORIDE 0.9 % IV SOLN: INTRAVENOUS | Status: DC

## 2022-02-03 MED ORDER — MIDAZOLAM HCL 2 MG/2ML IJ SOLN
INTRAMUSCULAR | Status: DC | PRN
  Administered 2022-02-03: 2 mg via INTRAVENOUS

## 2022-02-03 SURGICAL SUPPLY — 34 items
BAG DRAIN URO-CYSTO SKYTR STRL (DRAIN) ×3 IMPLANT
BAG DRN UROCATH (DRAIN) ×2
BASKET ZERO TIP NITINOL 2.4FR (BASKET) ×1 IMPLANT
BSKT STON RTRVL ZERO TP 2.4FR (BASKET) ×2
CATH SET URETHRAL DILATOR (CATHETERS) IMPLANT
CATH URET 5FR 28IN OPEN ENDED (CATHETERS) ×3 IMPLANT
CATH URETERAL DUAL LUMEN 10F (MISCELLANEOUS) ×1 IMPLANT
CLOTH BEACON ORANGE TIMEOUT ST (SAFETY) ×3 IMPLANT
COVER DOME SNAP 22 D (MISCELLANEOUS) ×1 IMPLANT
GLOVE BIO SURGEON STRL SZ7 (GLOVE) ×3 IMPLANT
GOWN STRL REUS W/TWL LRG LVL3 (GOWN DISPOSABLE) ×3 IMPLANT
GUIDEWIRE STR DUAL SENSOR (WIRE) ×4 IMPLANT
GUIDEWIRE ZIPWRE .038 STRAIGHT (WIRE) IMPLANT
INST BIOPSY MAXCORE 18GX25 (NEEDLE) ×1 IMPLANT
INSTR BIOPSY MAXCORE 18GX20 (NEEDLE) IMPLANT
IV NS IRRIG 3000ML ARTHROMATIC (IV SOLUTION) ×3 IMPLANT
KIT TURNOVER CYSTO (KITS) ×3 IMPLANT
MANIFOLD NEPTUNE II (INSTRUMENTS) ×3 IMPLANT
NDL SAFETY ECLIPSE 18X1.5 (NEEDLE) IMPLANT
NDL SPNL 22GX7 QUINCKE BK (NEEDLE) ×2 IMPLANT
NEEDLE HYPO 18GX1.5 SHARP (NEEDLE)
NEEDLE HYPO 22GX1.5 SAFETY (NEEDLE) IMPLANT
NEEDLE SPNL 22GX7 QUINCKE BK (NEEDLE) ×3 IMPLANT
NS IRRIG 500ML POUR BTL (IV SOLUTION) ×3 IMPLANT
PACK CYSTO (CUSTOM PROCEDURE TRAY) ×3 IMPLANT
SHEATH DILATOR SET 8/10 (MISCELLANEOUS) ×1 IMPLANT
SYR 10ML LL (SYRINGE) ×3 IMPLANT
SYR CONTROL 10ML LL (SYRINGE) ×2 IMPLANT
TRACTIP FLEXIVA PULS ID 200XHI (Laser) IMPLANT
TRACTIP FLEXIVA PULSE ID 200 (Laser) ×3
TUBE CONNECTING 12X1/4 (SUCTIONS) ×3 IMPLANT
TUBE FEEDING 8FR 16IN STR KANG (MISCELLANEOUS) IMPLANT
TUBING UROLOGY SET (TUBING) ×3 IMPLANT
UNDERPAD 30X36 HEAVY ABSORB (UNDERPADS AND DIAPERS) ×3 IMPLANT

## 2022-02-03 NOTE — Anesthesia Preprocedure Evaluation (Addendum)
Anesthesia Evaluation  ?Patient identified by MRN, date of birth, ID band ?Patient awake ? ? ? ?Reviewed: ?Allergy & Precautions, H&P , NPO status , Patient's Chart, lab work & pertinent test results, reviewed documented beta blocker date and time  ? ?Airway ?Mallampati: III ? ?TM Distance: >3 FB ?Neck ROM: Full ? ? ? Dental ?no notable dental hx. ?(+) Teeth Intact, Dental Advisory Given ?  ?Pulmonary ?neg pulmonary ROS,  ?  ?Pulmonary exam normal ?breath sounds clear to auscultation ? ? ? ? ? ? Cardiovascular ?+ dysrhythmias Atrial Fibrillation  ?Rhythm:Regular Rate:Normal ? ? ?  ?Neuro/Psych ?negative neurological ROS ? negative psych ROS  ? GI/Hepatic ?negative GI ROS, Neg liver ROS,   ?Endo/Other  ?negative endocrine ROS ? Renal/GU ?negative Renal ROS  ?negative genitourinary ?  ?Musculoskeletal ? ? Abdominal ?  ?Peds ? Hematology ?negative hematology ROS ?(+)   ?Anesthesia Other Findings ? ? Reproductive/Obstetrics ?negative OB ROS ? ?  ? ? ? ? ? ? ? ? ? ? ? ? ? ?  ?  ? ? ? ? ? ? ? ?Anesthesia Physical ?Anesthesia Plan ? ?ASA: 3 ? ?Anesthesia Plan: General  ? ?Post-op Pain Management: Tylenol PO (pre-op)*  ? ?Induction: Intravenous ? ?PONV Risk Score and Plan: 3 and Ondansetron, Dexamethasone and Treatment may vary due to age or medical condition ? ?Airway Management Planned: LMA ? ?Additional Equipment:  ? ?Intra-op Plan:  ? ?Post-operative Plan: Extubation in OR ? ?Informed Consent: I have reviewed the patients History and Physical, chart, labs and discussed the procedure including the risks, benefits and alternatives for the proposed anesthesia with the patient or authorized representative who has indicated his/her understanding and acceptance.  ? ? ? ?Dental advisory given ? ?Plan Discussed with: CRNA ? ?Anesthesia Plan Comments:   ? ? ? ? ? ? ?Anesthesia Quick Evaluation ? ?

## 2022-02-03 NOTE — H&P (Signed)
?CC/HPI: Arthur Barrett is a 66 year old male seen in consultation today for a right ureteral stone, urinary urgency, prostate nodule.  ? ?#1. Right ureteral stone:  ?He presented the ED on 12/04/2021 with acute onset right flank pain. He stated the pain started in his groin and radiate towards his back. CT A/P 12/04/2021 demonstrated 4 mm proximal right ureteral stone with right-sided hydronephrosis.  ?-His pain is presently well controlled. He denies any significant pain. He denies fevers or chills. He denies dysuria. He denies gross hematuria. He does not think that he has passed a stone however he is unsure.  ?-He states he had a kidney stone approximate 30 years ago that he passed. He has not required any surgeries for urolithiasis.  ? ?#2. Urinary urgency: He has a complaint of several months of urinary urgency. His IPSS score is 6 he reports a strong flow stream. He denies straining to void. He has 2 time nocturia., quality-of-life 3. He would like to consider Myrbetriq.  ? ?#3. Prostate nodule: On exam, he was found to have a right-sided prostatic nodule on the right half of his right lobe. PSA 12/19/2021 is normal at 1.83. He denies a prior history of elevated PSA. He was recently diagnosed with atrial fibrillation and was recently placed on Eliquis. As such, we will plan to delay his prostate biopsy.  ? ?Patient currently denies fever, chills, sweats, nausea, vomiting, abdominal or flank pain, gross hematuria or dysuria.  ? ?He has a past medical history of atrial fibrillation. He has had a prior appendectomy.  ? ?  ?ALLERGIES: No Allergies ?  ? ?MEDICATIONS: Lipitor 20 mg tablet  ?Eliquis 5 mg tablet  ?Flomax 0.4 mg capsule  ?Lopressor 50 mg tablet  ?  ? ?GU PSH: None  ?   ?PSH Notes: Appendectomy  ? ?NON-GU PSH: Appendectomy - 2012 ? ?  ? ?GU PMH: ED due to arterial insufficiency, Erectile dysfunction due to arterial insufficiency - 2014 ?Encounter for Prostate Cancer screening, Prostate cancer  screening - 2014 ?  ?   ?PMH Notes:  ?1898-10-20 00:00:00 - Note: Normal Routine History And Physical Adult  ?2010-10-30 14:43:18 - Note: Skull Fracture  ? ?NON-GU PMH: Decreased libido, Decreased libido - 2014 ?Personal history of other mental and behavioral disorders, History of depression - 2014 ?Atrial Fibrillation ?  ? ?FAMILY HISTORY: 1 Daughter - Runs in Family ?1 son - Runs in Family ?Chronic Obstructive Pulmonary Disease - Father ?Diabetes - Father ?Family Health Status Number - Runs In Family  ? ?SOCIAL HISTORY: Marital Status: Married ?Ethnicity: Not Hispanic Or Latino; Race: White ?Current Smoking Status: Patient has never smoked.  ? ?Tobacco Use Assessment Completed: Used Tobacco in last 30 days? ?Has never drank.  ?  ?  Notes: Alcohol Use, Occupation:, Marital History - Currently Married, Tobacco Use, Caffeine Use  ? ?REVIEW OF SYSTEMS:    ?GU Review Male:   Patient reports frequent urination, hard to postpone urination, get up at night to urinate, leakage of urine, and erection problems. Patient denies burning/ pain with urination, stream starts and stops, trouble starting your stream, have to strain to urinate , and penile pain.  ?Gastrointestinal (Upper):   Patient denies nausea, vomiting, and indigestion/ heartburn.  ?Gastrointestinal (Lower):   Patient denies diarrhea and constipation.  ?Constitutional:   Patient denies fever, night sweats, weight loss, and fatigue.  ?Skin:   Patient denies skin rash/ lesion and itching.  ?Eyes:   Patient denies blurred vision and double vision.  ?Ears/  Nose/ Throat:   Patient denies sore throat and sinus problems.  ?Hematologic/Lymphatic:   Patient denies swollen glands and easy bruising.  ?Cardiovascular:   Patient denies leg swelling and chest pains.  ?Respiratory:   Patient denies cough and shortness of breath.  ?Endocrine:   Patient denies excessive thirst.  ?Musculoskeletal:   Patient denies joint pain and back pain.  ?Neurological:   Patient denies  headaches and dizziness.  ?Psychologic:   Patient denies depression and anxiety.  ? ?VITAL SIGNS:    ?  12/18/2021 09:28 AM  ?Weight 172 lb / 78.02 kg  ?Height 69 in / 175.26 cm  ?BP 117/76 mmHg  ?Pulse 55 /min  ?Temperature 97.5 F / 36.3 C  ?BMI 25.4 kg/m?  ? ?GU PHYSICAL EXAMINATION:    ?Anus and Perineum: No hemorrhoids. No anal stenosis. No rectal fissure, no anal fissure. No edema, no dimple, no perineal tenderness, no anal tenderness.  ?Prostate: 40 gram or 2+ size. right side firm nodule about 64m  ?Seminal Vesicles: Nonpalpable.  ?Sphincter Tone: Normal sphincter. No rectal tenderness. No rectal mass.   ? ?MULTI-SYSTEM PHYSICAL EXAMINATION:    ?Constitutional: Well-nourished. No physical deformities. Normally developed. Good grooming.  ?Respiratory: No labored breathing, no use of accessory muscles.   ?Cardiovascular: Normal temperature, normal extremity pulses, no swelling, no varicosities.  ?Gastrointestinal: No mass, no tenderness, no rigidity, non obese abdomen.  ? ?  ?Complexity of Data:  ?Source Of History:  Patient, Medical Record Summary  ?Lab Test Review:   PSA  ?Records Review:   AUA Symptom Score, Previous Doctor Records  ?Urine Test Review:   Urinalysis  ?X-Ray Review: C.T. Abdomen/Pelvis: Reviewed Films. Reviewed Report. Discussed With Patient.  ?  ? 11/08/10  ?PSA  ?Total PSA 1.29   ? ? 11/08/10  ?Hormones  ?Testosterone, Total 497.64   ? ?Notes:                     CLINICAL DATA: Flank pain. Rule out kidney stone.  ? ?EXAM:  ?CT ABDOMEN AND PELVIS WITHOUT CONTRAST  ? ?TECHNIQUE:  ?Multidetector CT imaging of the abdomen and pelvis was performed  ?following the standard protocol without IV contrast.  ? ?RADIATION DOSE REDUCTION: This exam was performed according to the  ?departmental dose-optimization program which includes automated  ?exposure control, adjustment of the mA and/or kV according to  ?patient size and/or use of iterative reconstruction technique.  ? ?COMPARISON: None.   ? ?FINDINGS:  ?Lower chest: No acute abnormality.  ? ?Hepatobiliary: No focal liver abnormality. Gallbladder normal.  ? ?Pancreas: Unremarkable. No pancreatic ductal dilatation or  ?surrounding inflammatory changes.  ? ?Spleen: Normal in size without focal abnormality.  ? ?Adrenals/Urinary Tract: Normal adrenal glands. Upper pole right  ?renal calculus measures 4 mm. No left renal calculi. There is  ?right-sided perinephric fat stranding and hydronephrosis with  ?hydroureter. Within the proximal right ureter there is a stone  ?measuring 4 mm, image 78/6 and image 42/3. No left-sided  ?hydronephrosis or hydroureter. Urinary bladder is unremarkable.  ? ?Stomach/Bowel: Stomach is within normal limits. Status post  ?appendectomy. No evidence of bowel wall thickening, distention, or  ?inflammatory changes.  ? ?Vascular/Lymphatic: Mild aortic atherosclerosis. No aneurysm. No  ?abdominopelvic adenopathy.  ? ?Reproductive: Prostate is unremarkable. Partially visualized small  ?left hydrocele.  ? ?Other: No free fluid or fluid collections identified.  ? ?Musculoskeletal: Degenerative disc disease is noted at L5-S1.  ?Chronic superior endplate deformity involving the L3 vertebral body  ?is  unchanged from 2016.  ? ?IMPRESSION:  ?1. Right-sided hydronephrosis and hydroureter secondary to 4 mm  ?proximal right ureteral calculus.  ?2. Aortic Atherosclerosis (ICD10-I70.0).  ? ? ?Electronically Signed  ?By: Kerby Moors M.D.  ?On: 12/04/2021 07:21  ? ?PROCEDURES:    ? ?     Urinalysis ?Dipstick Dipstick Cont'd  ?Color: Yellow Bilirubin: Neg mg/dL  ?Appearance: Clear Ketones: Neg mg/dL  ?Specific Gravity: <=1.005 Blood: Neg ery/uL  ?pH: <=5.0 Protein: Neg mg/dL  ?Glucose: Neg mg/dL Urobilinogen: 0.2 mg/dL  ?  Nitrites: Neg  ?  Leukocyte Esterase: Neg leu/uL  ? ? ?ASSESSMENT:  ?    ICD-10 Details  ?1 GU:   Prostate nodule w/ LUTS - N40.3   ?2   Urinary Urgency - R39.15   ?3   Ureteral calculus - N20.1   ? ?PLAN:    ? ? ?       Medications ?New Meds: Myrbetriq 25 mg tablet, extended release 24 hr 1 tablet PO Daily   #90  3 Refill(s)  ?Pharmacy Name:  [facility]  ?Address:  [address]  ? [place], [place] [address]  ?Phone:  [phone]

## 2022-02-03 NOTE — Op Note (Signed)
Operative Note ? ?Preoperative diagnosis:  ?1.  Right ureteral and renal stone ?2. Prostate nodule ? ?Postoperative diagnosis: ?1.  Right ureteral stone ?2. Prostate nodule ? ?Procedure(s): ?1.  Cystoscopy ?2. Right ureteroscopy with laser lithotripsy and basket extraction of stones ?3. Right retrograde pyelogram ?4. Right ureteral stent placement ?5. Fluoroscopy with intraoperative interpretation ?6. Transrectal ultrasound guided biopsy of the prostate ? ?Surgeon: Rexene Alberts, MD ? ?Assistants:  None ? ?Anesthesia:  General ? ?Complications:  None ? ?EBL:  Minimal ? ?Specimens: ?1. Stones for stone analysis (to be done at [facility]) ? ?Drains/Catheters: ?1.  Right 6Fr x 26cm ureteral stent WITHOUT a tether string ? ?Intraoperative findings:   ?Cystoscopy demonstrated no suspicious bladder lesions. ?Right ureteroscopy demonstrated narrowed caliber right distal ureter.  This was dilated.  I was able to access his mid ureter where encountered a 4 mm stone.  This was fragmented and basket extracted.  He had a narrowed mid ureter at the level of the iliac vessels.  I was unable to bypass this with an 8/10 [place] dilator, dual-lumen ureteral catheter or single-lumen flexible ureteroscope as these instruments buckled. ?Right retrograde pyelogram demonstrated no extravasation of contrast and no hydronephrosis. ?Successful right ureteral stent placement without a tether string. ?Successful prostate biopsy with no significant bleeding.  Approximately 45 cc prostate. ? ?Indication:  Arthur Barrett is a 66 y.o. male with with history of a right ureteral and right renal stone and prostate nodule.  He is here for definitive treatment of stones and prostate biopsy. ? ?Description of procedure: ?After informed consent was obtained from the patient, the patient was identified and taken to the operating room and placed in the supine position.  General anesthesia was administered as well as perioperative IV antibiotics.   At the beginning of the case, a time-out was performed to properly identify the patient, the surgery to be performed, and the surgical site.  Sequential compression devices were applied to the lower extremities at the beginning of the case for DVT prophylaxis.  The patient was then placed in the dorsal lithotomy supine position, prepped and draped in sterile fashion. ? ?We then passed the 21-French rigid cystoscope through the urethra and into the bladder under vision without any difficulty , noting a normal urethra without strictures and a mildly obstructing prostate.  A systematic evaluation of the bladder revealed no evidence of any suspicious bladder lesions.  Ureteral orifices were in normal position.  ? ?Under cystoscopic and flouroscopic guidance, we cannulated the right ureteral orifice with a 5-French open-ended ureteral catheter and a gentle retrograde pyelogram was performed, revealing a normal caliber ureter without any filling defects. There was no hydronephrosis of the collecting system. A 0.038 sensor wire was then passed up to the level of the renal pelvis and secured to the drape as a safety wire. The ureteral catheter and cystoscope were removed, leaving the safety wire in place.  ? ?A semi-rigid ureteroscope was passed alongside the wire up the distal ureter which appeared was narrowed.  I was able to bypass this area which was semirigid ureteroscope into the mid ureter corresponding to the area of the iliacs.  There encountered a 4 mm stone.  Using a 200 ?m holmium laser fiber, the stone was then fragmented and basket extracted.  No further fragments remained in the distal ureter.  I was able to bypass the mid ureter with a semirigid ureteroscope as the area was narrowed. A second 0.038 sensor wire was passed under direct  vision and the semirigid scope was removed.  I used an 32/10 [place] ureteral dilator however I was unable to dilate past the mid ureter with a 10 French dilator as this area was  stenotic.  I then passed a dual-lumen catheter and was unable to bypass this is Buckling.  I performed a distal right retrograde pyelogram demonstrated no extravasation of contrast and no hydronephrosis.  I did need to pass a single-lumen flexible digital ureteroscope beyond this area and similarly, it buckled and would not bypass.  Thus I elected to abort this attempt and leave a ureteral stent for passive dilation. ? ?We then withdrew the ureteroscope back down the ureter noting no evidence of any stones along the course of the ureter.  Prior to removing the ureteroscope, we did pass the Glidewire back up to the ureter to the renal pelvis.   ? ?Once the ureteroscope was removed, the Glidewire was backloaded through the rigid cystoscope, which was then advanced down the urethra and into the bladder. We then used the Glidewire under direct vision through the rigid cystoscope and under fluoroscopic guidance and passed up a 6-French, 26 cm double-pigtail ureteral stent up ureter, making sure that the proximal and distal ends coiled within the kidney and bladder respectively. ? ?He was then laid in the left lateral decubitus position. The transrectal ultrasound probe was then placed into the rectum and used to visualize the prostate. 10 cc of 1% lidocaine was then utilized to provide local anesthesia with a periprostatic nerve block. Images of the prostate were then obtained systematically. The prostate volume was measured at 45 cc. Systematic biopsies of the prostate were then performed under ultrasound guidance. A total of 12 cores were obtained. Biopsies were taken from the lateral and parasagittal regions of the base, mid, and apex of the prostate bilaterally.  Following removal of the transrectal ultrasound, a digital rectal exam was performed and it was confirmed that no significant bleeding was present. The patient tolerated the procedure well and without complications. ? ? ?The patient tolerated the procedure  well and there was no complication. Patient was awoken from anesthesia and taken to the recovery room in stable condition. I was present and scrubbed for the entirety of the case. ? ?Plan:  Patient will be discharged home.  He will follow with me next week to review pathology.  I will plan to reschedule him for repeat right ureteroscopy with laser lithotripsy in approximately 2 weeks. ? ?Matt R. Jahmarion Popoff MD ?[facility]  ?Pager: [phone] ? ?

## 2022-02-03 NOTE — Anesthesia Procedure Notes (Signed)
Procedure Name: LMA Insertion ?Date/Time: 02/03/2022 1:45 PM ?Performed by: Suan Halter, CRNA ?Pre-anesthesia Checklist: Patient identified, Emergency Drugs available, Suction available and Patient being monitored ?Patient Re-evaluated:Patient Re-evaluated prior to induction ?Oxygen Delivery Method: Circle system utilized ?Preoxygenation: Pre-oxygenation with 100% oxygen ?Induction Type: IV induction ?Ventilation: Mask ventilation without difficulty ?LMA: LMA inserted ?LMA Size: 4.0 ?Number of attempts: 1 ?Airway Equipment and Method: Bite block ?Placement Confirmation: positive ETCO2 ?Tube secured with: Tape ?Dental Injury: Teeth and Oropharynx as per pre-operative assessment  ? ? ? ? ?

## 2022-02-03 NOTE — Transfer of Care (Signed)
Immediate Anesthesia Transfer of Care Note ? ?Patient: Arthur Barrett ? ?Procedure(s) Performed: Procedure(s) (LRB): ?CYSTOSCOPY/RETROGRADE/URETEROSCOPY/HOLMIUM LASER/STENT PLACEMENT (Right) ?BIOPSY TRANSRECTAL ULTRASONIC PROSTATE (TUBP) (N/A) ? ?Patient Location: PACU ? ?Anesthesia Type: General ? ?Level of Consciousness: awake, oriented, sedated and patient cooperative ? ?Airway & Oxygen Therapy: Patient Spontanous Breathing and Patient connected to face mask oxygen ? ?Post-op Assessment: Report given to PACU RN and Post -op Vital signs reviewed and stable ? ?Post vital signs: Reviewed and stable ? ?Complications: No apparent anesthesia complications ? ?Last Vitals:  ?Vitals Value Taken Time  ?BP 131/70 02/03/22 1502  ?Temp    ?Pulse 68 02/03/22 1505  ?Resp 20 02/03/22 1505  ?SpO2 100 % 02/03/22 1505  ?Vitals shown include unvalidated device data. ? ?Last Pain:  ?Vitals:  ? 02/03/22 1142  ?TempSrc: Oral  ?PainSc: 0-No pain  ?   ? ?Patients Stated Pain Goal: 3 (02/03/22 1142) ? ?Complications: No notable events documented. ?

## 2022-02-03 NOTE — Discharge Instructions (Addendum)
No acetaminophen/Tylenol until after 6:20pm today if needed for pain. ? ? ? ? ? ?[facility] ?[phone] ?Post Ureteroscopy With or Without Stent Instructions ? ?Definitions: ? ?Ureter: The duct that transports urine from the kidney to the bladder. ?Stent:   A plastic hollow tube that is placed into the ureter, from the kidney to the bladder to prevent the ureter from swelling shut. ? ?GENERAL INSTRUCTIONS: ? ?Despite the fact that no skin incisions were used, the area around the ureter and bladder is raw and irritated. The stent is a foreign body which will further irritate the bladder wall. This irritation is manifested by increased frequency of urination, both day and night, and by an increase in the urge to urinate. In some, the urge to urinate is present almost always. Sometimes the urge is strong enough that you may not be able to stop yourself from urinating. The only real cure is to remove the stent and then give time for the bladder wall to heal which can't be done until the danger of the ureter swelling shut has passed, which varies. ? ?You may see some blood in your urine while the stent is in place and a few days afterwards. Do not be alarmed, even if the urine was clear for a while. Get off your feet and drink lots of fluids until clearing occurs. If you start to pass clots or don't improve, call us. ? ?DIET: ?You may return to your normal diet immediately. Because of the raw surface of your bladder, alcohol, spicy foods, acid type foods and drinks with caffeine may cause irritation or frequency and should be used in moderation. To keep your urine flowing freely and to avoid constipation, drink plenty of fluids during the day ( 8-10 glasses ). ?Tip: Avoid cranberry juice because it is very acidic. ? ?ACTIVITY: ?Your physical activity doesn't need to be restricted. However, if you are very active, you may see some blood in your urine. We suggest that you reduce your activity under  these circumstances until the bleeding has stopped. ? ?BOWELS: ?It is important to keep your bowels regular during the postoperative period. Straining with bowel movements can cause bleeding. A bowel movement every other day is reasonable. Use a mild laxative if needed, such as Milk of Magnesia 2-3 tablespoons, or 2 Dulcolax tablets. Call if you continue to have problems. If you have been taking narcotics for pain, before, during or after your surgery, you may be constipated. Take a laxative if necessary. ? ? ?MEDICATION: ?You should resume your pre-surgery medications unless told not to. In addition you will often be given an antibiotic to prevent infection. These should be taken as prescribed until the bottles are finished unless you are having an unusual reaction to one of the drugs. ? ?PROBLEMS YOU SHOULD REPORT TO [place]: ?Fevers over 100.5 Fahrenheit. ?Heavy bleeding, or clots ( See above notes about blood in urine ). ?Inability to urinate. ?Drug reactions ( hives, rash, nausea, vomiting, diarrhea ). ?Severe burning or pain with urination that is not improving. ? ?FOLLOW-UP: ?You will need a follow-up appointment to monitor your progress. Call for this appointment at the number listed above. Usually the first appointment will be about three to fourteen days after your surgery. ? ? ?You have a right ureteral stent in place.  This will remain in place until your next ureteroscopy. ? ? ? ? ?Post Anesthesia Home Care Instructions ? ?Activity: ?Get plenty of rest for the remainder of the day. A  responsible individual must stay with you for 24 hours following the procedure.  ?For the next 24 hours, DO NOT: ?-Drive a car ?-Paediatric nurse ?-Drink alcoholic beverages ?-Take any medication unless instructed by your physician ?-Make any legal decisions or sign important papers. ? ?Meals: ?Start with liquid foods such as gelatin or soup. Progress to regular foods as tolerated. Avoid greasy, spicy, heavy foods. If nausea  and/or vomiting occur, drink only clear liquids until the nausea and/or vomiting subsides. Call your physician if vomiting continues. ? ?Special Instructions/Symptoms: ?Your throat may feel dry or sore from the anesthesia or the breathing tube placed in your throat during surgery. If this causes discomfort, gargle with warm salt water. The discomfort should disappear within 24 hours.    ?

## 2022-02-04 ENCOUNTER — Other Ambulatory Visit: Payer: Self-pay | Admitting: Urology

## 2022-02-04 ENCOUNTER — Telehealth: Payer: Self-pay | Admitting: Cardiology

## 2022-02-04 ENCOUNTER — Encounter (HOSPITAL_BASED_OUTPATIENT_CLINIC_OR_DEPARTMENT_OTHER): Payer: Self-pay | Admitting: Urology

## 2022-02-04 ENCOUNTER — Other Ambulatory Visit (HOSPITAL_COMMUNITY): Payer: Self-pay

## 2022-02-04 MED ORDER — TAMSULOSIN HCL 0.4 MG PO CAPS
0.4000 mg | ORAL_CAPSULE | Freq: Every day | ORAL | 3 refills | Status: DC
  Filled 2022-02-04 – 2022-02-28 (×2): qty 30, 30d supply, fill #0
  Filled 2022-03-28: qty 30, 30d supply, fill #1
  Filled 2022-04-27: qty 30, 30d supply, fill #2
  Filled 2022-05-27: qty 30, 30d supply, fill #3

## 2022-02-04 MED ORDER — OXYBUTYNIN CHLORIDE ER 10 MG PO TB24
10.0000 mg | ORAL_TABLET | Freq: Every day | ORAL | 3 refills | Status: DC
  Filled 2022-02-04 (×2): qty 30, 30d supply, fill #0

## 2022-02-04 NOTE — Anesthesia Postprocedure Evaluation (Signed)
Anesthesia Post Note ? ?Patient: PABLO STAUFFER ? ?Procedure(s) Performed: CYSTOSCOPY/RETROGRADE/URETEROSCOPY/HOLMIUM LASER/STENT PLACEMENT (Right: Ureter) ?BIOPSY TRANSRECTAL ULTRASONIC PROSTATE (TUBP) (Prostate) ? ?  ? ?Patient location during evaluation: PACU ?Anesthesia Type: General ?Level of consciousness: awake and alert ?Pain management: pain level controlled ?Vital Signs Assessment: post-procedure vital signs reviewed and stable ?Respiratory status: spontaneous breathing, nonlabored ventilation and respiratory function stable ?Cardiovascular status: blood pressure returned to baseline and stable ?Postop Assessment: no apparent nausea or vomiting ?Anesthetic complications: no ? ? ?No notable events documented. ? ?Last Vitals:  ?Vitals:  ? 02/03/22 1600 02/03/22 1640  ?BP: 136/84 129/90  ?Pulse: 60 (!) 54  ?Resp: 20 19  ?Temp: (!) 36.3 ?C   ?SpO2: 100% 100%  ?  ?Last Pain:  ?Vitals:  ? 02/04/22 1015  ?TempSrc:   ?PainSc: 7   ? ? ?  ?  ?  ?  ?  ?  ? ?Joseeduardo Brix,W. EDMOND ? ? ? ? ?

## 2022-02-04 NOTE — Telephone Encounter (Signed)
Await pharm recs then can bundle finalized preop recommendations. (Routed to pharm already below.) ?

## 2022-02-04 NOTE — Telephone Encounter (Signed)
? ?  Patient Name: Arthur Barrett  ?DOB: 06/17/1956 ?MRN: [id number] ? ?Primary Cardiologist: Berniece Salines, DO / EP  - Dr. Curt Bears ? ?Chart reviewed as part of pre-operative protocol coverage.  ?Patient was just recently seen by Dr. Curt Bears 01/23/22 with an ablation planned in 04/2022. Preop clearance is requested for Cystoscopy, Ureteroscopy, Laser Lithotripsy, and Stent Placement under general anesthesia on 02/17/22. Will reach out to Dr. Curt Bears to find out if he is able to medically clear patient for the procedure given recent OV (and whether this impacts plans for ablation); if not, will need updated televisit with gen cards APP. Dr. Curt Bears - Please route response to P CV DIV PREOP (the pre-op pool). Thank you. ? ?Will also plan to route to pharm for prelim input on anticoag.  ? ?Charlie Pitter, [name] ?02/04/2022, 2:55 PM ? ? ?

## 2022-02-04 NOTE — Telephone Encounter (Signed)
? ?  [facility] Pre-operative Risk Assessment  ?  ?Request for surgical clearance: ? ?What type of surgery is being performed?  ?Cystoscopy, Ureteroscopy, Laser Lithotripsy, and Stent Placement  ? ?When is this surgery scheduled?  ?02/17/22  ? ?What type of clearance is required (medical clearance vs. Pharmacy clearance to hold med vs. Both)?  ?Both  ? ?Are there any medications that need to be held prior to surgery and how long? ?Eliquis, 3 days prior  ? ?Practice name and name of physician performing surgery?  ?[facility]  Dr. Rexene Alberts  ? ?What is your office phone number? ?[phone] (ext#: [phone])  ?  ?7.   What is your office fax number? ?[phone] ? ?8.   Anesthesia type (None, local, MAC, general) ?  ?General  ? ? ?Zara Council ?02/04/2022, 2:29 PM  ? ?Per [name] with [facility], their presurgical testing department is requiring a new clearance for 02/17/22 procedures due to patient going back on Eliquis (see 4/13 encounter). [name] also mentioned the only procedure they were able to complete was the Prostate Biopsy. ?

## 2022-02-05 ENCOUNTER — Institutional Professional Consult (permissible substitution): Payer: PPO | Admitting: Physician Assistant

## 2022-02-05 LAB — SURGICAL PATHOLOGY

## 2022-02-05 NOTE — Telephone Encounter (Deleted)
Patient with diagnosis of Atrial Fibrillation on Eliquis for anticoagulation.   ? ?Procedure: Ureteroscopy, Laser Lithotripsy, and Stent Placement under general anesthesia  ?Date of procedure: 02/17/2022 ? ? ?CHA2DS2-VASc Score = 2  ?This indicates a 2.2% annual risk of stroke. ?The patient's score is based upon: ?CHF History: 0 ?HTN History: 0 ?Diabetes History: 0 ?Stroke History: 0 ?Vascular Disease History: 1 ?Age Score: 1 ?Gender Score: 0 ?  ?CrCl 27 mL/min (SCr 1.4 on 01/09/22 - baseline ~1.1 to 1.2) ?Platelet count 162 ? ?Per office protocol, patient can hold Eliquis for 3 days prior to procedure.   ?

## 2022-02-05 NOTE — Telephone Encounter (Signed)
Rec remains the same as in 12/23/21 and 01/30/22 clearance, ok to hold Eliquis for 3 days prior. > 30 days out from [facility]. Does have ablation scheduled now but it's not until July so urology procedure timing is fine. ?

## 2022-02-05 NOTE — Telephone Encounter (Signed)
? ?  Name: Arthur Barrett  ?DOB: 02/20/1956  ?MRN: [id number]  ? ?Primary Cardiologist: Berniece Salines, DO ? ?Chart reviewed as part of pre-operative protocol coverage.  ?We have been asked for preoperative risk stratification and eliquis hold for Cystoscopy, Ureteroscopy, Laser Lithotripsy, and Stent Placement. ? ?Per our clinical pharmacist: ?Ok to hold Eliquis for 3 days prior, he is > 30 days out from [place]. Does have ablation scheduled now but it's not until July so urology procedure timing is fine. ? ?Per Dr. Curt Bears: ?He is intermediate risk for this low to intermediate risk procedure.  No further cardiac testing is necessary.  ? ? ?I will route this recommendation to the requesting party via Epic fax function and remove from pre-op pool. Please call with questions. ? ?Ledora Bottcher, PA ?02/05/2022, 9:43 AM ? ?

## 2022-02-11 DIAGNOSIS — N200 Calculus of kidney: Secondary | ICD-10-CM | POA: Diagnosis not present

## 2022-02-11 DIAGNOSIS — R3915 Urgency of urination: Secondary | ICD-10-CM | POA: Diagnosis not present

## 2022-02-11 DIAGNOSIS — C61 Malignant neoplasm of prostate: Secondary | ICD-10-CM | POA: Diagnosis not present

## 2022-02-13 ENCOUNTER — Encounter (HOSPITAL_BASED_OUTPATIENT_CLINIC_OR_DEPARTMENT_OTHER): Payer: Self-pay | Admitting: Urology

## 2022-02-13 ENCOUNTER — Other Ambulatory Visit: Payer: Self-pay

## 2022-02-13 ENCOUNTER — Encounter: Payer: Self-pay | Admitting: Cardiology

## 2022-02-13 ENCOUNTER — Other Ambulatory Visit (HOSPITAL_COMMUNITY): Payer: Self-pay

## 2022-02-13 ENCOUNTER — Other Ambulatory Visit: Payer: Self-pay | Admitting: *Deleted

## 2022-02-13 MED ORDER — METOPROLOL SUCCINATE ER 50 MG PO TB24
ORAL_TABLET | ORAL | 3 refills | Status: DC
  Filled 2022-02-13: qty 135, 90d supply, fill #0
  Filled 2022-05-09: qty 135, 90d supply, fill #1

## 2022-02-13 NOTE — Progress Notes (Signed)
Spoke w/ via phone for pre-op interview---pt ?Lab needs dos----I stat               ?Lab results------see below ?COVID test ----patient states asymptomatic no test needed ?Arrive at -------7001 am 02-17-2022 ?NPO after MN NO Solid Food.  Clear liquids from MN until---1045 am ?Med rec completed ?Medications to take morning of surgery -----Pacerone, Metoprolol Succinate, Mybetriq ?Diabetic medication -----n/a ?Patient instructed no nail polish to be worn day of surgery ?Patient instructed to bring photo id and insurance card day of surgery ?Patient aware to have Driver (ride ) / caregiver  Thersa Salt wife cell [phone] will stay  for 24 hours after surgery  ?Patient Special Instructions -----last dose eliquis to be 02-13-2022 per patient ?Pre-Op special Istructions -----none ?Patient verbalized understanding of instructions that were given at this phone interview. ?Patient denies shortness of breath, chest pain, fever, cough at this phone interview.  ? ?Cardiac clearance note angela [facility] pa 02-05-2022 chart/epic ?Ekg 01-24-2022 chart/epic ?Ep study 01-23-2022 for scheduled ablation for a gib with dr w Curt Bears 04-29-2022 ?Echo tee: LVEF 50-55 % 12-06-2021  epic ?Echo LVEF 40 -45 % epic ?Lov dr Elliot Cousin electrophysiology 01-23-2022 epic ? ?Secure chat sent to dr r ellender mda for 02-17-2022 surgery at [place]. ?

## 2022-02-17 ENCOUNTER — Encounter (HOSPITAL_BASED_OUTPATIENT_CLINIC_OR_DEPARTMENT_OTHER): Payer: Self-pay | Admitting: Urology

## 2022-02-17 ENCOUNTER — Other Ambulatory Visit (HOSPITAL_COMMUNITY): Payer: Self-pay

## 2022-02-17 ENCOUNTER — Ambulatory Visit (HOSPITAL_BASED_OUTPATIENT_CLINIC_OR_DEPARTMENT_OTHER): Payer: PPO | Admitting: Anesthesiology

## 2022-02-17 ENCOUNTER — Other Ambulatory Visit: Payer: Self-pay

## 2022-02-17 ENCOUNTER — Ambulatory Visit (HOSPITAL_BASED_OUTPATIENT_CLINIC_OR_DEPARTMENT_OTHER)
Admission: RE | Admit: 2022-02-17 | Discharge: 2022-02-17 | Disposition: A | Discharge status: Home / Self Care | Payer: PPO | Attending: Urology | Admitting: Urology

## 2022-02-17 ENCOUNTER — Encounter (HOSPITAL_BASED_OUTPATIENT_CLINIC_OR_DEPARTMENT_OTHER)
Admission: RE | Disposition: A | Discharge status: Home / Self Care | Payer: Self-pay | Source: Home / Self Care | Attending: Urology

## 2022-02-17 DIAGNOSIS — M4800 Spinal stenosis, site unspecified: Secondary | ICD-10-CM

## 2022-02-17 DIAGNOSIS — Z9889 Other specified postprocedural states: Secondary | ICD-10-CM | POA: Diagnosis not present

## 2022-02-17 DIAGNOSIS — Z7901 Long term (current) use of anticoagulants: Secondary | ICD-10-CM | POA: Diagnosis not present

## 2022-02-17 DIAGNOSIS — I4891 Unspecified atrial fibrillation: Secondary | ICD-10-CM | POA: Diagnosis not present

## 2022-02-17 DIAGNOSIS — N201 Calculus of ureter: Secondary | ICD-10-CM

## 2022-02-17 DIAGNOSIS — N200 Calculus of kidney: Secondary | ICD-10-CM

## 2022-02-17 DIAGNOSIS — N210 Calculus in bladder: Secondary | ICD-10-CM | POA: Diagnosis not present

## 2022-02-17 HISTORY — PX: CYSTOSCOPY/URETEROSCOPY/HOLMIUM LASER/STENT PLACEMENT: SHX6546

## 2022-02-17 LAB — POCT I-STAT, CHEM 8
BUN: 16 mg/dL (ref 8–23)
Calcium, Ion: 1.17 mmol/L (ref 1.15–1.40)
Chloride: 101 mmol/L (ref 98–111)
Creatinine, Ser: 1.2 mg/dL (ref 0.61–1.24)
Glucose, Bld: 95 mg/dL (ref 70–99)
HCT: 49 % (ref 39.0–52.0)
Hemoglobin: 16.7 g/dL (ref 13.0–17.0)
Potassium: 5 mmol/L (ref 3.5–5.1)
Sodium: 139 mmol/L (ref 135–145)
TCO2: 31 mmol/L (ref 22–32)

## 2022-02-17 SURGERY — CYSTOSCOPY/URETEROSCOPY/HOLMIUM LASER/STENT PLACEMENT
Anesthesia: General | Site: Ureter | Laterality: Right

## 2022-02-17 MED ORDER — CEFAZOLIN SODIUM-DEXTROSE 2-4 GM/100ML-% IV SOLN
2.0000 g | Freq: Once | INTRAVENOUS | Status: AC
  Administered 2022-02-17: 2 g via INTRAVENOUS

## 2022-02-17 MED ORDER — PHENYLEPHRINE 80 MCG/ML (10ML) SYRINGE FOR IV PUSH (FOR BLOOD PRESSURE SUPPORT)
PREFILLED_SYRINGE | INTRAVENOUS | Status: DC | PRN
  Administered 2022-02-17 (×2): 80 ug via INTRAVENOUS

## 2022-02-17 MED ORDER — EPHEDRINE 5 MG/ML INJ
INTRAVENOUS | Status: AC
  Filled 2022-02-17: qty 5

## 2022-02-17 MED ORDER — PROPOFOL 10 MG/ML IV BOLUS
INTRAVENOUS | Status: DC | PRN
  Administered 2022-02-17: 150 mg via INTRAVENOUS

## 2022-02-17 MED ORDER — ACETAMINOPHEN 500 MG PO TABS
1000.0000 mg | ORAL_TABLET | Freq: Once | ORAL | Status: AC
  Administered 2022-02-17: 1000 mg via ORAL

## 2022-02-17 MED ORDER — ACETAMINOPHEN 500 MG PO TABS
ORAL_TABLET | ORAL | Status: AC
  Filled 2022-02-17: qty 2

## 2022-02-17 MED ORDER — DEXAMETHASONE SODIUM PHOSPHATE 10 MG/ML IJ SOLN
INTRAMUSCULAR | Status: AC
  Filled 2022-02-17: qty 1

## 2022-02-17 MED ORDER — ONDANSETRON HCL 4 MG/2ML IJ SOLN
INTRAMUSCULAR | Status: DC | PRN
  Administered 2022-02-17: 4 mg via INTRAVENOUS

## 2022-02-17 MED ORDER — FENTANYL CITRATE (PF) 100 MCG/2ML IJ SOLN
INTRAMUSCULAR | Status: DC | PRN
  Administered 2022-02-17: 50 ug via INTRAVENOUS

## 2022-02-17 MED ORDER — 0.9 % SODIUM CHLORIDE (POUR BTL) OPTIME
TOPICAL | Status: DC | PRN
  Administered 2022-02-17: 500 mL

## 2022-02-17 MED ORDER — PHENYLEPHRINE 80 MCG/ML (10ML) SYRINGE FOR IV PUSH (FOR BLOOD PRESSURE SUPPORT)
PREFILLED_SYRINGE | INTRAVENOUS | Status: AC
  Filled 2022-02-17: qty 10

## 2022-02-17 MED ORDER — SODIUM CHLORIDE 0.9 % IR SOLN
Status: DC | PRN
Start: 2022-02-17 — End: 2022-02-17
  Administered 2022-02-17: 3000 mL

## 2022-02-17 MED ORDER — ONDANSETRON HCL 4 MG/2ML IJ SOLN
INTRAMUSCULAR | Status: AC
  Filled 2022-02-17: qty 2

## 2022-02-17 MED ORDER — OXYCODONE HCL 5 MG PO TABS: 5.0000 mg | ORAL_TABLET | Freq: Once | ORAL | Status: DC | PRN

## 2022-02-17 MED ORDER — ONDANSETRON HCL 4 MG PO TABS
4.0000 mg | ORAL_TABLET | Freq: Every day | ORAL | 0 refills | Status: DC | PRN
  Filled 2022-02-17: qty 20, 20d supply, fill #0

## 2022-02-17 MED ORDER — EPHEDRINE SULFATE-NACL 50-0.9 MG/10ML-% IV SOSY
PREFILLED_SYRINGE | INTRAVENOUS | Status: DC | PRN
  Administered 2022-02-17: 15 mg via INTRAVENOUS

## 2022-02-17 MED ORDER — FENTANYL CITRATE (PF) 100 MCG/2ML IJ SOLN
INTRAMUSCULAR | Status: AC
  Filled 2022-02-17: qty 2

## 2022-02-17 MED ORDER — OXYCODONE HCL 5 MG/5ML PO SOLN: 5.0000 mg | Freq: Once | ORAL | Status: DC | PRN

## 2022-02-17 MED ORDER — IOHEXOL 300 MG/ML  SOLN
INTRAMUSCULAR | Status: DC | PRN
  Administered 2022-02-17: 5 mL via URETHRAL

## 2022-02-17 MED ORDER — FENTANYL CITRATE (PF) 100 MCG/2ML IJ SOLN: 25.0000 ug | INTRAMUSCULAR | Status: DC | PRN

## 2022-02-17 MED ORDER — CEPHALEXIN 500 MG PO CAPS
500.0000 mg | ORAL_CAPSULE | Freq: Two times a day (BID) | ORAL | 0 refills | Status: AC
  Filled 2022-02-17: qty 6, 3d supply, fill #0

## 2022-02-17 MED ORDER — AMISULPRIDE (ANTIEMETIC) 5 MG/2ML IV SOLN: 10.0000 mg | Freq: Once | INTRAVENOUS | Status: DC | PRN

## 2022-02-17 MED ORDER — MIDAZOLAM HCL 2 MG/2ML IJ SOLN
INTRAMUSCULAR | Status: AC
  Filled 2022-02-17: qty 2

## 2022-02-17 MED ORDER — OXYCODONE-ACETAMINOPHEN 5-325 MG PO TABS
1.0000 | ORAL_TABLET | ORAL | 0 refills | Status: DC | PRN
  Filled 2022-02-17: qty 12, 2d supply, fill #0

## 2022-02-17 MED ORDER — ONDANSETRON HCL 4 MG/2ML IJ SOLN: 4.0000 mg | Freq: Once | INTRAMUSCULAR | Status: DC | PRN

## 2022-02-17 MED ORDER — DEXAMETHASONE SODIUM PHOSPHATE 10 MG/ML IJ SOLN
INTRAMUSCULAR | Status: DC | PRN
Start: 2022-02-17 — End: 2022-02-17
  Administered 2022-02-17: 5 mg via INTRAVENOUS

## 2022-02-17 MED ORDER — CEFAZOLIN SODIUM-DEXTROSE 2-4 GM/100ML-% IV SOLN
INTRAVENOUS | Status: AC
  Filled 2022-02-17: qty 100

## 2022-02-17 MED ORDER — LIDOCAINE 2% (20 MG/ML) 5 ML SYRINGE
INTRAMUSCULAR | Status: DC | PRN
  Administered 2022-02-17: 40 mg via INTRAVENOUS

## 2022-02-17 MED ORDER — LACTATED RINGERS IV SOLN: INTRAVENOUS | Status: DC

## 2022-02-17 MED ORDER — MIDAZOLAM HCL 2 MG/2ML IJ SOLN
INTRAMUSCULAR | Status: DC | PRN
  Administered 2022-02-17: 1 mg via INTRAVENOUS

## 2022-02-17 SURGICAL SUPPLY — 23 items
APL SKNCLS STERI-STRIP NONHPOA (GAUZE/BANDAGES/DRESSINGS) ×1
BAG DRAIN URO-CYSTO SKYTR STRL (DRAIN) ×2 IMPLANT
BAG DRN UROCATH (DRAIN) ×1
BENZOIN TINCTURE PRP APPL 2/3 (GAUZE/BANDAGES/DRESSINGS) ×1 IMPLANT
CATH URET 5FR 28IN OPEN ENDED (CATHETERS) ×2 IMPLANT
CLOTH BEACON ORANGE TIMEOUT ST (SAFETY) ×2 IMPLANT
DRSG TEGADERM 2-3/8X2-3/4 SM (GAUZE/BANDAGES/DRESSINGS) ×1 IMPLANT
GLOVE BIO SURGEON STRL SZ 6 (GLOVE) ×2 IMPLANT
GLOVE BIO SURGEON STRL SZ7 (GLOVE) ×2 IMPLANT
GLOVE BIOGEL PI IND STRL 6 (GLOVE) IMPLANT
GLOVE BIOGEL PI INDICATOR 6 (GLOVE) ×1
GOWN STRL REUS W/TWL LRG LVL3 (GOWN DISPOSABLE) ×3 IMPLANT
GUIDEWIRE STR DUAL SENSOR (WIRE) ×2 IMPLANT
GUIDEWIRE ZIPWRE .038 STRAIGHT (WIRE) ×1 IMPLANT
IV NS IRRIG 3000ML ARTHROMATIC (IV SOLUTION) ×2 IMPLANT
KIT TURNOVER CYSTO (KITS) ×2 IMPLANT
MANIFOLD NEPTUNE II (INSTRUMENTS) ×2 IMPLANT
NS IRRIG 500ML POUR BTL (IV SOLUTION) ×2 IMPLANT
PACK CYSTO (CUSTOM PROCEDURE TRAY) ×2 IMPLANT
STENT URET 6FRX26 CONTOUR (STENTS) ×1 IMPLANT
SYR 10ML LL (SYRINGE) ×2 IMPLANT
TUBE CONNECTING 12X1/4 (SUCTIONS) ×2 IMPLANT
TUBING UROLOGY SET (TUBING) ×2 IMPLANT

## 2022-02-17 NOTE — H&P (Signed)
Urology Preoperative H&P  ? ?Chief Complaint: Right renal stone ? ?History of Present Illness: Arthur Barrett is a 66 y.o. male with a right renal stone here for right URS/LL. Denies fevers, chills, dysuria. ? ? ? ?Past Medical History:  ?Diagnosis Date  ? Atrial fibrillation ([place])   ? scheduled for ablation with dr will camnitz 04-29-2022  ? History of abnormal  myocardial perfusion imaging scan 07/37/1062  ? mild systolic dysfunction ef 46 % intermediate risk study see further results epic report  ? History of COVID-19 03/2021  ? mild all symptoms resolved  ? History of COVID-19 03/2021  ? mild all symptoms resolved  ? History of echocardiogram 12/04/2021  ? ef 40 to 45 %  ? History of kidney stones   ? yrs ago per pt  ? History of skull fracture   ? 13 yrs ago tree fell on pt no surgery done no residual treated at [place]  ? History of transesophageal echocardiography (TEE) 12/06/2021  ? successful cardioversion done, ef 50 to 55 %  ? Intermittent palpitations   ? on flecaninde  ? Wears glasses   ? ? ?Past Surgical History:  ?Procedure Laterality Date  ? APPENDECTOMY    ? 61 yrs agp per pt at [facility]  ? CARDIOVERSION N/A 12/06/2021  ? Procedure: CARDIOVERSION;  Surgeon: Pixie Casino, MD;  Location: [facility] ENDOSCOPY;  Service: Cardiovascular;  Laterality: N/A;  ? colonscopy     ? 2018 or 2019 normal per pt  ? CYSTOSCOPY/URETEROSCOPY/HOLMIUM LASER/STENT PLACEMENT Right 02/03/2022  ? Procedure: IRSWNIOEVO/JJKKXFGHWE/XHBZJIRCVELF/YBOFBPZ LASER/STENT PLACEMENT;  Surgeon: Janith Lima, MD;  Location: [facility];  Service: Urology;  Laterality: Right;  ? LUMBAR LAMINECTOMY/DECOMPRESSION MICRODISCECTOMY Left 01/31/2015  ? Procedure: MICRO LUMBAR DECOMPRESSION LUMBAR FIVE TO SACRAL ONE ON LEFT;  Surgeon: Susa Day, MD;  Location: [facility] ORS;  Service: Orthopedics;  Laterality: Left;  ? neck and back surgery done 13 yrs ago at [place] after tree fell on patient    ? no problems since neck  and back surgery with rom or neck movement  ? PROSTATE BIOPSY N/A 02/03/2022  ? Procedure: BIOPSY TRANSRECTAL ULTRASONIC PROSTATE (TUBP);  Surgeon: Janith Lima, MD;  Location: [facility];  Service: Urology;  Laterality: N/A;  ? TEE WITHOUT CARDIOVERSION N/A 12/06/2021  ? Procedure: TRANSESOPHAGEAL ECHOCARDIOGRAM (TEE);  Surgeon: Pixie Casino, MD;  Location: [facility];  Service: Cardiovascular;  Laterality: N/A;  ? ? ?Allergies: No Known Allergies ? ?Family History  ?Family history unknown: Yes  ? ? ?Social History:  reports that he has never smoked. He has never used smokeless tobacco. He reports that he does not drink alcohol and does not use drugs. ? ?ROS: ?A complete review of systems was performed.  All systems are negative except for pertinent findings as noted. ? ?Physical Exam:  ?Vital signs in last 24 hours: ?Temp:  [97.7 ?F (36.5 ?C)] 97.7 ?F (36.5 ?C) (05/01 1253) ?Pulse Rate:  [64] 64 (05/01 1253) ?Resp:  [16] 16 (05/01 1253) ?BP: (121)/(81) 121/81 (05/01 1253) ?SpO2:  [98 %] 98 % (05/01 1253) ?Weight:  [79 kg] 79 kg (05/01 1253) ?Constitutional:  Alert and oriented, No acute distress ?Cardiovascular: Regular rate and rhythm ?Respiratory: Normal respiratory effort, Lungs clear bilaterally ?GI: Abdomen is soft, nontender, nondistended, no abdominal masses ?GU: No CVA tenderness ?Lymphatic: No lymphadenopathy ?Neurologic: Grossly intact, no focal deficits ?Psychiatric: Normal mood and affect ? ?Laboratory Data:  ?Recent Labs  ?  02/17/22 ?  1251  ?HGB 16.7  ?HCT 49.0  ? ? ?Recent Labs  ?  02/17/22 ?1251  ?NA 139  ?K 5.0  ?CL 101  ?GLUCOSE 95  ?BUN 16  ?CREATININE 1.20  ? ? ? ?Results for orders placed or performed during the hospital encounter of 02/17/22 (from the past 24 hour(s))  ?I-STAT, chem 8     Status: None  ? Collection Time: 02/17/22 12:51 PM  ?Result Value Ref Range  ? Sodium 139 135 - 145 mmol/L  ? Potassium 5.0 3.5 - 5.1 mmol/L  ? Chloride 101 98 - 111 mmol/L  ? BUN 16 8  - 23 mg/dL  ? Creatinine, Ser 1.20 0.61 - 1.24 mg/dL  ? Glucose, Bld 95 70 - 99 mg/dL  ? Calcium, Ion 1.17 1.15 - 1.40 mmol/L  ? TCO2 31 22 - 32 mmol/L  ? Hemoglobin 16.7 13.0 - 17.0 g/dL  ? HCT 49.0 39.0 - 52.0 %  ? ?No results found for this or any previous visit (from the past 240 hour(s)). ? ?Renal Function: ?Recent Labs  ?  02/17/22 ?1251  ?CREATININE 1.20  ? ?Estimated Creatinine Clearance: 61.4 mL/min (by C-G formula based on SCr of 1.2 mg/dL). ? ?Radiologic Imaging: ?No results found. ? ?I independently reviewed the above imaging studies. ? ?Assessment and Plan ?Arthur Barrett is a 66 y.o. male with a right renal stone here for right URS/LL, R stent exchange. ? ?-The risks, benefits and alternatives of cystoscopy with R URS/LL, R JJ stent placement was discussed with the patient.  Risks include, but are not limited to: bleeding, urinary tract infection, ureteral injury, ureteral stricture disease, chronic pain, urinary symptoms, bladder injury, stent migration, the need for nephrostomy tube placement, MI, CVA, DVT, PE and the inherent risks with general anesthesia.  The patient voices understanding and wishes to proceed.   ? ?Matt R. Jaken Fregia MD ?02/17/2022, 1:05 PM  ?[facility] ?Pager: (336): [phone]  ?

## 2022-02-17 NOTE — Discharge Instructions (Addendum)
[facility] ?[phone] ?Post Ureteroscopy With or Without Stent Instructions ? ?Definitions: ? ?Ureter: The duct that transports urine from the kidney to the bladder. ?Stent:   A plastic hollow tube that is placed into the ureter, from the kidney to the bladder to prevent the ureter from swelling shut. ? ?GENERAL INSTRUCTIONS: ? ?Despite the fact that no skin incisions were used, the area around the ureter and bladder is raw and irritated. The stent is a foreign body which will further irritate the bladder wall. This irritation is manifested by increased frequency of urination, both day and night, and by an increase in the urge to urinate. In some, the urge to urinate is present almost always. Sometimes the urge is strong enough that you may not be able to stop yourself from urinating. The only real cure is to remove the stent and then give time for the bladder wall to heal which can't be done until the danger of the ureter swelling shut has passed, which varies. ? ?You may see some blood in your urine while the stent is in place and a few days afterwards. Do not be alarmed, even if the urine was clear for a while. Get off your feet and drink lots of fluids until clearing occurs. If you start to pass clots or don't improve, call us. ? ?DIET: ?You may return to your normal diet immediately. Because of the raw surface of your bladder, alcohol, spicy foods, acid type foods and drinks with caffeine may cause irritation or frequency and should be used in moderation. To keep your urine flowing freely and to avoid constipation, drink plenty of fluids during the day ( 8-10 glasses ). ?Tip: Avoid cranberry juice because it is very acidic. ? ?ACTIVITY: ?Your physical activity doesn't need to be restricted. However, if you are very active, you may see some blood in your urine. We suggest that you reduce your activity under these circumstances until the bleeding has stopped. ? ?BOWELS: ?It is important to  keep your bowels regular during the postoperative period. Straining with bowel movements can cause bleeding. A bowel movement every other day is reasonable. Use a mild laxative if needed, such as Milk of Magnesia 2-3 tablespoons, or 2 Dulcolax tablets. Call if you continue to have problems. If you have been taking narcotics for pain, before, during or after your surgery, you may be constipated. Take a laxative if necessary. ? ? ?MEDICATION: ?You should resume your pre-surgery medications unless told not to. In addition you will often be given an antibiotic to prevent infection. These should be taken as prescribed until the bottles are finished unless you are having an unusual reaction to one of the drugs. ? ?PROBLEMS YOU SHOULD REPORT TO [place]: ?Fevers over 100.5 Fahrenheit. ?Heavy bleeding, or clots ( See above notes about blood in urine ). ?Inability to urinate. ?Drug reactions ( hives, rash, nausea, vomiting, diarrhea ). ?Severe burning or pain with urination that is not improving. ? ?FOLLOW-UP: ?You will need a follow-up appointment to monitor your progress. Call for this appointment at the number listed above. Usually the first appointment will be about three to fourteen days after your surgery.  ? ? ? ? ?Post Anesthesia Home Care Instructions ? ?Activity: ?Get plenty of rest for the remainder of the day. A responsible individual must stay with you for 24 hours following the procedure.  ?For the next 24 hours, DO NOT: ?-Drive a car ?-Operate machinery ?-Drink alcoholic beverages ?-Take any medication unless instructed by  your physician ?-Make any legal decisions or sign important papers. ? ?Meals: ?Start with liquid foods such as gelatin or soup. Progress to regular foods as tolerated. Avoid greasy, spicy, heavy foods. If nausea and/or vomiting occur, drink only clear liquids until the nausea and/or vomiting subsides. Call your physician if vomiting continues. ? ?Special Instructions/Symptoms: ?Your throat may  feel dry or sore from the anesthesia or the breathing tube placed in your throat during surgery. If this causes discomfort, gargle with warm salt water. The discomfort should disappear within 24 hours. ? ?Do not take any Tylenol until after 7:00 pm today if needed. ? ? ?    ?

## 2022-02-17 NOTE — Transfer of Care (Signed)
Immediate Anesthesia Transfer of Care Note ? ?Patient: Arthur Barrett ? ?Procedure(s) Performed: Procedure(s) (LRB): ?CYSTOSCOPY/ RETROGRADE/URETEROSCOPY/STENT EXCHANGED (Right) ? ?Patient Location: PACU ? ?Anesthesia Type: General ? ?Level of Consciousness: awake, oriented, sedated and patient cooperative ? ?Airway & Oxygen Therapy: Patient Spontanous Breathing and Patient connected to face mask oxygen ? ?Post-op Assessment: Report given to PACU RN and Post -op Vital signs reviewed and stable ? ?Post vital signs: Reviewed and stable ? ?Complications: No apparent anesthesia complications ? ?Last Vitals:  ?Vitals Value Taken Time  ?BP 115/78 02/17/22 1502  ?Temp 36.6 ?C 02/17/22 1457  ?Pulse 55 02/17/22 1504  ?Resp 20 02/17/22 1504  ?SpO2 99 % 02/17/22 1504  ?Vitals shown include unvalidated device data. ? ?Last Pain:  ?Vitals:  ? 02/17/22 1457  ?TempSrc:   ?PainSc: 0-No pain  ?   ? ?Patients Stated Pain Goal: 3 (02/17/22 1457) ? ?Complications: No notable events documented. ?

## 2022-02-17 NOTE — Anesthesia Preprocedure Evaluation (Addendum)
Anesthesia Evaluation  ?Patient identified by MRN, date of birth, ID band ?Patient awake ? ? ? ?Reviewed: ?Allergy & Precautions, NPO status , Patient's Chart, lab work & pertinent test results ? ?Airway ?Mallampati: II ? ?TM Distance: <3 FB ?Neck ROM: Full ? ? ? Dental ?no notable dental hx. ? ?  ?Pulmonary ?neg pulmonary ROS,  ?  ?Pulmonary exam normal ?breath sounds clear to auscultation ? ? ? ? ? ? Cardiovascular ?Exercise Tolerance: Good ?METS: 3 - Mets negative cardio ROS ?Normal cardiovascular exam+ dysrhythmias (on eliquis s/p cardioverstion; ablation scheduled for 04/2022) Atrial Fibrillation  ?Rhythm:Regular Rate:Normal ? ?Most recent EKGs shows sinus brady.  ? ? ?12/26/21 stress test ? Findings are consistent with prior myocardial infarction. The study is intermediate risk. ??  No ST deviation was noted. ??  LV perfusion is abnormal. Defect 1: There is a small defect with mild reduction in uptake present in the apex location(s) that is fixed. There is abnormal wall motion in the defect area. Consistent with infarction. Defect 2: There is a medium defect with mild reduction in uptake present in the apical to basal inferior location(s) that is fixed. There is normal wall motion in the defect area. Consistent with artifact caused by diaphragmatic attenuation. ??  Left ventricular function is abnormal. End diastolic cavity size is mildly enlarged. End systolic cavity size is mildly enlarged. ??  Prior study not available for comparison. ?? ?1. Fixed perfusion defect at apex with hypokinesis consistent with infarct ?2. Fixed inferior perfusion defect with normal wall motion suggests artifact ?3. Mild systolic dysfunction (EF 30%) ?4. Intermediate risk study due to systolic dysfunction.  No ischemia. ? ?  ?Neuro/Psych ?negative neurological ROS ? negative psych ROS  ? GI/Hepatic ?negative GI ROS, Neg liver ROS,   ?Endo/Other  ?negative endocrine ROS ? Renal/GU ?Renal disease  (kidney stones)negative Renal ROS  ?negative genitourinary ?  ?Musculoskeletal ?negative musculoskeletal ROS ?(+) Spinal stenosis ?  ? Abdominal ?  ?Peds ?negative pediatric ROS ?(+)  Hematology ?negative hematology ROS ?(+)   ?Anesthesia Other Findings ? ? Reproductive/Obstetrics ?negative OB ROS ? ?  ? ? ? ? ? ? ? ? ? ? ? ? ? ?  ?  ? ? ? ? ? ? ? ?Anesthesia Physical ?Anesthesia Plan ? ?ASA: 3 ? ?Anesthesia Plan:   ? ?Post-op Pain Management: Tylenol PO (pre-op)*  ? ?Induction: Intravenous ? ?PONV Risk Score and Plan: 1 and Treatment may vary due to age or medical condition, Ondansetron and Dexamethasone ? ?Airway Management Planned: LMA ? ?Additional Equipment: None ? ?Intra-op Plan:  ? ?Post-operative Plan: Extubation in OR ? ?Informed Consent: I have reviewed the patients History and Physical, chart, labs and discussed the procedure including the risks, benefits and alternatives for the proposed anesthesia with the patient or authorized representative who has indicated his/her understanding and acceptance.  ? ? ? ?Dental advisory given ? ?Plan Discussed with: CRNA, Anesthesiologist and Surgeon ? ?Anesthesia Plan Comments: (Flecainide stopped in March 2023. )  ? ? ? ? ? ?Anesthesia Quick Evaluation ? ?

## 2022-02-17 NOTE — Op Note (Signed)
Operative Note ? ?Preoperative diagnosis:  ?1.  Right renal stone ? ?Postoperative diagnosis: ?1.  Right renal stone ? ?Procedure(s): ?1.  Cystoscopy ?2. Right ureteroscopy with laser lithotripsy and basket extraction of stones ?3. Right retrograde pyelogram ?4. Right ureteral stent placement ?5. Fluoroscopy with intraoperative interpretation ? ?Surgeon: Rexene Alberts, MD ? ?Assistants:  None ? ?Anesthesia:  General ? ?Complications:  None ? ?EBL:  Minimal ? ?Specimens: ?None ? ?Drains/Catheters: ?1.  Right 6Fr x 26cm ureteral stent WITH a tether string ? ?Intraoperative findings:   ?Cystoscopy demonstrated 4 mm bladder stone, irrigated away. ?Right ureteroscopy demonstrated dilated right ureter, no ureteral or renal stones. ?Successful right stent exchange. ? ?Indication:  Arthur Barrett is a 66 y.o. male with a history of renal stones here for 2nd look ureteroscopy. First attempt with inability to access the kidney given tight ureter. ? ?Description of procedure: ?After informed consent was obtained from the patient, the patient was identified and taken to the operating room and placed in the supine position.  General anesthesia was administered as well as perioperative IV antibiotics.  At the beginning of the case, a time-out was performed to properly identify the patient, the surgery to be performed, and the surgical site.  Sequential compression devices were applied to the lower extremities at the beginning of the case for DVT prophylaxis.  The patient was then placed in the dorsal lithotomy supine position, prepped and draped in sterile fashion. ? ?We then passed the 21-French rigid cystoscope through the urethra and into the bladder under vision without any difficulty, noting a normal urethra without strictures and a mildly obstructing prostate.  A systematic evaluation of the bladder revealed no evidence of any suspicious bladder lesions. He had a small 102m stone that was irrigated away. Ureteral orifices  were in normal position.   ? ?The distal aspect of the ureteral stent was seen protruding from the right ureteral orifice.  We then used the alligator-tooth forceps and grasped the distal end of the ureteral stent and brought it out the urethral meatus while watching the proximal coil straighten out nicely on fluoroscopy. Through the ureteral stent, we then passed a 0.038 sensor wire up to the level of the renal pelvis.  The ureteral stent was then removed, leaving the sensor wire up the right ureter.   ? ?Under cystoscopic and flouroscopic guidance, we cannulated the right ureteral orifice with a 5-French open-ended ureteral catheter and a gentle retrograde pyelogram was performed, revealing a normal caliber ureter without any filling defects. There was no hydronephrosis of the collecting system. A 0.038 sensor wire was then passed up to the level of the renal pelvis and secured to the drape as a safety wire. The ureteral catheter and cystoscope were removed, leaving the safety wire in place.  ? ?A semi-rigid ureteroscope was passed alongside the wire up the distal ureter which appeared normal. A second 0.038 sensor wire was passed under direct vision and the semirigid scope was removed. The flexible ureteroscope was advanced into the collecting system. The collecting system was inspected. There was no renal or ureteral stone identified after surveying the kidney 5 separate times. With the ureteroscope in the kidney, a gentle pyelogram was performed to delineate the calyceal system and we evaluated the calyces systematically. We encountered a no further stone fragments.  ? ?We then withdrew the ureteroscope back down the ureter, noting no evidence of any stones along the course of the ureter.  Prior to removing the ureteroscope, we did  pass the Glidewire back up to the ureter to the renal pelvis.   ? ?Once the ureteroscope was removed, the Glidewire was backloaded through the rigid cystoscope, which was then  advanced down the urethra and into the bladder. We then used the Glidewire under direct vision through the rigid cystoscope and under fluoroscopic guidance and passed up a 6-French, 26 cm double-pigtail ureteral stent up ureter, making sure that the proximal and distal ends coiled within the kidney and bladder respectively. ? ?Note that we left a long tether string attached to the distal end of the ureteral stent and it exited the urethral meatus and was secured to the penile shaft with a tegaderm adhesive.  The cystoscope was then advanced back into the bladder under vision.  We were able to see the distal stent coiling nicely within the bladder.  The bladder was then emptied with irrigation solution.  The cystoscope was then removed.   ? ?The patient tolerated the procedure well and there was no complication. Patient was awoken from anesthesia and taken to the recovery room in stable condition. I was present and scrubbed for the entirety of the case. ? ?Plan:  Patient will be discharged home. He will remove his stent in 3 days. ? ?Matt R. Jazlene Bares MD ?[facility]  ?Pager: [phone]  ?

## 2022-02-17 NOTE — Anesthesia Procedure Notes (Signed)
Procedure Name: LMA Insertion ?Date/Time: 02/17/2022 2:26 PM ?Performed by: Suan Halter, CRNA ?Pre-anesthesia Checklist: Patient identified, Emergency Drugs available, Suction available and Patient being monitored ?Patient Re-evaluated:Patient Re-evaluated prior to induction ?Oxygen Delivery Method: Circle system utilized ?Preoxygenation: Pre-oxygenation with 100% oxygen ?Induction Type: IV induction ?Ventilation: Mask ventilation without difficulty ?LMA: LMA inserted ?LMA Size: 4.0 ?Number of attempts: 1 ?Airway Equipment and Method: Bite block ?Placement Confirmation: positive ETCO2 ?Tube secured with: Tape ?Dental Injury: Teeth and Oropharynx as per pre-operative assessment  ? ? ? ? ?

## 2022-02-18 NOTE — Anesthesia Postprocedure Evaluation (Signed)
Anesthesia Post Note ? ?Patient: Arthur Barrett ? ?Procedure(s) Performed: CYSTOSCOPY/ RETROGRADE/URETEROSCOPY/STENT EXCHANGED (Right: Ureter) ? ?  ? ?Patient location during evaluation: PACU ?Anesthesia Type: General ?Level of consciousness: awake and alert ?Pain management: pain level controlled ?Vital Signs Assessment: post-procedure vital signs reviewed and stable ?Respiratory status: spontaneous breathing, nonlabored ventilation and respiratory function stable ?Cardiovascular status: blood pressure returned to baseline and stable ?Postop Assessment: no apparent nausea or vomiting ?Anesthetic complications: no ? ? ?No notable events documented. ? ?Last Vitals:  ?Vitals:  ? 02/17/22 1529 02/17/22 1553  ?BP: 117/77 126/77  ?Pulse:  (!) 53  ?Resp:  16  ?Temp: 36.4 ?C (!) 36.3 ?C  ?SpO2:  100%  ?  ?Last Pain:  ?Vitals:  ? 02/17/22 1553  ?TempSrc: Oral  ?PainSc: 2   ? ? ?  ?  ?  ?  ?  ?  ? ?Lynda Rainwater ? ? ? ? ?

## 2022-02-19 ENCOUNTER — Encounter (HOSPITAL_BASED_OUTPATIENT_CLINIC_OR_DEPARTMENT_OTHER): Payer: Self-pay | Admitting: Urology

## 2022-02-28 ENCOUNTER — Other Ambulatory Visit (HOSPITAL_COMMUNITY): Payer: Self-pay

## 2022-03-01 ENCOUNTER — Other Ambulatory Visit (HOSPITAL_COMMUNITY): Payer: Self-pay

## 2022-03-06 ENCOUNTER — Other Ambulatory Visit (HOSPITAL_COMMUNITY): Payer: Self-pay

## 2022-03-06 MED ORDER — ATORVASTATIN CALCIUM 20 MG PO TABS
ORAL_TABLET | ORAL | 3 refills | Status: DC
  Filled 2022-03-06: qty 90, 90d supply, fill #0

## 2022-03-07 ENCOUNTER — Other Ambulatory Visit (HOSPITAL_COMMUNITY): Payer: Self-pay

## 2022-03-12 ENCOUNTER — Other Ambulatory Visit (HOSPITAL_COMMUNITY): Payer: Self-pay

## 2022-03-12 DIAGNOSIS — N200 Calculus of kidney: Secondary | ICD-10-CM | POA: Diagnosis not present

## 2022-03-12 DIAGNOSIS — C61 Malignant neoplasm of prostate: Secondary | ICD-10-CM | POA: Diagnosis not present

## 2022-03-12 DIAGNOSIS — R3915 Urgency of urination: Secondary | ICD-10-CM | POA: Diagnosis not present

## 2022-03-12 DIAGNOSIS — N5201 Erectile dysfunction due to arterial insufficiency: Secondary | ICD-10-CM | POA: Diagnosis not present

## 2022-03-15 ENCOUNTER — Other Ambulatory Visit (HOSPITAL_COMMUNITY): Payer: Self-pay

## 2022-03-17 ENCOUNTER — Encounter: Payer: Self-pay | Admitting: Cardiology

## 2022-03-18 ENCOUNTER — Encounter: Payer: Self-pay | Admitting: Cardiology

## 2022-03-18 ENCOUNTER — Other Ambulatory Visit (HOSPITAL_COMMUNITY): Payer: Self-pay

## 2022-03-18 ENCOUNTER — Other Ambulatory Visit: Payer: Self-pay

## 2022-03-18 DIAGNOSIS — I4891 Unspecified atrial fibrillation: Secondary | ICD-10-CM

## 2022-03-18 MED ORDER — APIXABAN 5 MG PO TABS: 5.0000 mg | ORAL_TABLET | Freq: Two times a day (BID) | ORAL | 2 refills | Status: DC

## 2022-03-18 MED ORDER — AMIODARONE HCL 200 MG PO TABS
200.0000 mg | ORAL_TABLET | Freq: Every day | ORAL | 3 refills | Status: DC
  Filled 2022-03-18: qty 90, 90d supply, fill #0
  Filled 2022-06-10: qty 90, 90d supply, fill #1

## 2022-03-18 NOTE — Telephone Encounter (Signed)
Prescription refill request for Eliquis received. Indication: Afib  Last office visit:01/23/22 (Camnitz)  Scr: 1.40 (01/09/22)  Age: 66 Weight: 79kg  Appropriate dose and refill sent to requested pharmacy.

## 2022-03-18 NOTE — Telephone Encounter (Signed)
Appropriate dose and refill sent to requested pharmacy.  

## 2022-03-20 ENCOUNTER — Encounter: Payer: Self-pay | Admitting: Cardiology

## 2022-03-20 ENCOUNTER — Ambulatory Visit (INDEPENDENT_AMBULATORY_CARE_PROVIDER_SITE_OTHER): Payer: PPO | Admitting: Cardiology

## 2022-03-20 VITALS — BP 122/68 | HR 56 | Ht 69.0 in | Wt 177.2 lb

## 2022-03-20 DIAGNOSIS — I480 Paroxysmal atrial fibrillation: Secondary | ICD-10-CM | POA: Diagnosis not present

## 2022-03-20 NOTE — Patient Instructions (Signed)
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
## 2022-03-28 ENCOUNTER — Other Ambulatory Visit (HOSPITAL_COMMUNITY): Payer: Self-pay

## 2022-03-31 ENCOUNTER — Other Ambulatory Visit (HOSPITAL_COMMUNITY): Payer: Self-pay

## 2022-04-01 ENCOUNTER — Encounter: Payer: Self-pay | Admitting: Cardiology

## 2022-04-01 DIAGNOSIS — I4819 Other persistent atrial fibrillation: Secondary | ICD-10-CM

## 2022-04-01 DIAGNOSIS — Z01812 Encounter for preprocedural laboratory examination: Secondary | ICD-10-CM

## 2022-04-02 ENCOUNTER — Encounter: Payer: Self-pay | Admitting: *Deleted

## 2022-04-02 NOTE — Telephone Encounter (Signed)
Called to review instructions verbally. Pt currently driving in traffic. Aware I will send via mychart. Pt will let me know if questions. Advised of Eliquis instructions. Patient verbalized understanding and agreeable to plan.

## 2022-04-09 ENCOUNTER — Encounter: Payer: Self-pay | Admitting: *Deleted

## 2022-04-09 ENCOUNTER — Encounter: Payer: Self-pay | Admitting: Cardiology

## 2022-04-09 NOTE — Telephone Encounter (Signed)
Pt aware I will re-send letters of instruction (CT & Ablation) via mychart. To review them. Informed that I would call back Friday or early next week to review them. Patient verbalized understanding and agreeable to plan.

## 2022-04-15 DIAGNOSIS — Z01812 Encounter for preprocedural laboratory examination: Secondary | ICD-10-CM | POA: Diagnosis not present

## 2022-04-15 DIAGNOSIS — I4819 Other persistent atrial fibrillation: Secondary | ICD-10-CM | POA: Diagnosis not present

## 2022-04-16 LAB — BASIC METABOLIC PANEL
BUN/Creatinine Ratio: 13 (ref 10–24)
BUN: 16 mg/dL (ref 8–27)
CO2: 25 mmol/L (ref 20–29)
Calcium: 9 mg/dL (ref 8.6–10.2)
Chloride: 103 mmol/L (ref 96–106)
Creatinine, Ser: 1.24 mg/dL (ref 0.76–1.27)
Glucose: 86 mg/dL (ref 70–99)
Potassium: 3.7 mmol/L (ref 3.5–5.2)
Sodium: 144 mmol/L (ref 134–144)
eGFR: 65 mL/min/{1.73_m2} (ref >59)

## 2022-04-16 LAB — CBC
Hematocrit: 48.2 % (ref 37.5–51.0)
Hemoglobin: 16.3 g/dL (ref 13.0–17.7)
MCH: 31.3 pg (ref 26.6–33.0)
MCHC: 33.8 g/dL (ref 31.5–35.7)
MCV: 93 fL (ref 79–97)
Platelets: 205 10*3/uL (ref 150–450)
RBC: 5.21 x10E6/uL (ref 4.14–5.80)
RDW: 12.8 % (ref 11.6–15.4)
WBC: 7 10*3/uL (ref 3.4–10.8)

## 2022-04-23 ENCOUNTER — Telehealth (HOSPITAL_COMMUNITY): Payer: Self-pay | Admitting: *Deleted

## 2022-04-23 NOTE — Telephone Encounter (Signed)
Reaching out to patient to offer assistance regarding upcoming cardiac imaging study; pt verbalizes understanding of appt date/time, parking situation and where to check in, pre-test NPO status, and verified current allergies; name and call back number provided for further questions should they arise  Gordy Clement RN Navigator Cardiac Imaging [facility] [phone] office [phone] cell  Patient aware to arrive at 1pm.

## 2022-04-24 ENCOUNTER — Ambulatory Visit (HOSPITAL_COMMUNITY)
Admission: RE | Admit: 2022-04-24 | Discharge: 2022-04-24 | Disposition: A | Discharge status: Home / Self Care | Payer: PPO | Source: Ambulatory Visit | Attending: Cardiology | Admitting: Cardiology

## 2022-04-24 DIAGNOSIS — I4819 Other persistent atrial fibrillation: Secondary | ICD-10-CM | POA: Diagnosis not present

## 2022-04-24 MED ORDER — IOHEXOL 350 MG/ML SOLN
100.0000 mL | Freq: Once | INTRAVENOUS | Status: AC | PRN
  Administered 2022-04-24: 100 mL via INTRAVENOUS

## 2022-04-28 ENCOUNTER — Other Ambulatory Visit (HOSPITAL_COMMUNITY): Payer: Self-pay

## 2022-04-28 NOTE — Pre-Procedure Instructions (Signed)
Instructed patient on the following items: Arrival time 0830 Nothing to eat or drink after midnight No meds AM of procedure Responsible person to drive you home and stay with you for 24 hrs  Have you missed any doses of anti-coagulant Eliquis- hasn't missed any doses   

## 2022-04-29 ENCOUNTER — Encounter (HOSPITAL_COMMUNITY)
Admission: RE | Disposition: A | Discharge status: Home / Self Care | Payer: PPO | Source: Home / Self Care | Attending: Cardiology

## 2022-04-29 ENCOUNTER — Ambulatory Visit (HOSPITAL_BASED_OUTPATIENT_CLINIC_OR_DEPARTMENT_OTHER): Payer: PPO

## 2022-04-29 ENCOUNTER — Other Ambulatory Visit (HOSPITAL_COMMUNITY): Payer: Self-pay

## 2022-04-29 ENCOUNTER — Ambulatory Visit (HOSPITAL_COMMUNITY)
Admission: RE | Admit: 2022-04-29 | Discharge: 2022-04-29 | Disposition: A | Discharge status: Home / Self Care | Payer: PPO | Attending: Cardiology | Admitting: Cardiology

## 2022-04-29 ENCOUNTER — Encounter (HOSPITAL_COMMUNITY): Payer: Self-pay | Admitting: Cardiology

## 2022-04-29 ENCOUNTER — Ambulatory Visit (HOSPITAL_COMMUNITY): Payer: PPO

## 2022-04-29 DIAGNOSIS — I480 Paroxysmal atrial fibrillation: Secondary | ICD-10-CM | POA: Diagnosis not present

## 2022-04-29 DIAGNOSIS — I4891 Unspecified atrial fibrillation: Secondary | ICD-10-CM | POA: Diagnosis not present

## 2022-04-29 DIAGNOSIS — I5020 Unspecified systolic (congestive) heart failure: Secondary | ICD-10-CM

## 2022-04-29 DIAGNOSIS — I509 Heart failure, unspecified: Secondary | ICD-10-CM | POA: Diagnosis not present

## 2022-04-29 DIAGNOSIS — I483 Typical atrial flutter: Secondary | ICD-10-CM | POA: Diagnosis not present

## 2022-04-29 DIAGNOSIS — I4892 Unspecified atrial flutter: Secondary | ICD-10-CM | POA: Diagnosis not present

## 2022-04-29 DIAGNOSIS — D759 Disease of blood and blood-forming organs, unspecified: Secondary | ICD-10-CM | POA: Diagnosis not present

## 2022-04-29 HISTORY — PX: ATRIAL FIBRILLATION ABLATION: EP1191

## 2022-04-29 LAB — POCT ACTIVATED CLOTTING TIME
Activated Clotting Time: 269 seconds
Activated Clotting Time: 299 seconds
Activated Clotting Time: 335 seconds

## 2022-04-29 SURGERY — ATRIAL FIBRILLATION ABLATION
Anesthesia: General

## 2022-04-29 MED ORDER — DOBUTAMINE INFUSION FOR EP/ECHO/NUC (1000 MCG/ML)
INTRAVENOUS | Status: AC
  Filled 2022-04-29: qty 250

## 2022-04-29 MED ORDER — DEXAMETHASONE SODIUM PHOSPHATE 10 MG/ML IJ SOLN
INTRAMUSCULAR | Status: DC | PRN
  Administered 2022-04-29: 5 mg via INTRAVENOUS

## 2022-04-29 MED ORDER — SUGAMMADEX SODIUM 200 MG/2ML IV SOLN
INTRAVENOUS | Status: DC | PRN
  Administered 2022-04-29: 200 mg via INTRAVENOUS

## 2022-04-29 MED ORDER — COLCHICINE 0.6 MG PO TABS
0.6000 mg | ORAL_TABLET | Freq: Two times a day (BID) | ORAL | 0 refills | Status: DC
  Filled 2022-04-29: qty 28, 14d supply, fill #0

## 2022-04-29 MED ORDER — ACETAMINOPHEN 325 MG PO TABS: 650.0000 mg | ORAL_TABLET | ORAL | Status: DC | PRN

## 2022-04-29 MED ORDER — PROTAMINE SULFATE 10 MG/ML IV SOLN
INTRAVENOUS | Status: DC | PRN
  Administered 2022-04-29: 40 mg via INTRAVENOUS

## 2022-04-29 MED ORDER — HEPARIN (PORCINE) IN NACL 1000-0.9 UT/500ML-% IV SOLN
INTRAVENOUS | Status: DC | PRN
  Administered 2022-04-29 (×4): 500 mL

## 2022-04-29 MED ORDER — DOBUTAMINE INFUSION FOR EP/ECHO/NUC (1000 MCG/ML)
INTRAVENOUS | Status: DC | PRN
  Administered 2022-04-29: 10 ug/kg/min via INTRAVENOUS

## 2022-04-29 MED ORDER — SODIUM CHLORIDE 0.9% FLUSH: 3.0000 mL | Freq: Two times a day (BID) | INTRAVENOUS | Status: DC

## 2022-04-29 MED ORDER — SODIUM CHLORIDE 0.9 % IV SOLN: INTRAVENOUS | Status: DC

## 2022-04-29 MED ORDER — SODIUM CHLORIDE 0.9 % IV BOLUS
500.0000 mL | Freq: Once | INTRAVENOUS | Status: AC
  Administered 2022-04-29: 500 mL via INTRAVENOUS

## 2022-04-29 MED ORDER — HEPARIN SODIUM (PORCINE) 1000 UNIT/ML IJ SOLN
INTRAMUSCULAR | Status: DC | PRN
  Administered 2022-04-29: 3000 [IU] via INTRAVENOUS
  Administered 2022-04-29: 5000 [IU] via INTRAVENOUS
  Administered 2022-04-29: 14000 [IU] via INTRAVENOUS
  Administered 2022-04-29: 1000 [IU] via INTRAVENOUS

## 2022-04-29 MED ORDER — PHENYLEPHRINE HCL-NACL 20-0.9 MG/250ML-% IV SOLN
INTRAVENOUS | Status: DC | PRN
  Administered 2022-04-29: 40 ug/min via INTRAVENOUS

## 2022-04-29 MED ORDER — HEPARIN SODIUM (PORCINE) 1000 UNIT/ML IJ SOLN
INTRAMUSCULAR | Status: AC
  Filled 2022-04-29: qty 10

## 2022-04-29 MED ORDER — HEPARIN SODIUM (PORCINE) 1000 UNIT/ML IJ SOLN
INTRAMUSCULAR | Status: DC | PRN
  Administered 2022-04-29: 1000 [IU] via INTRAVENOUS

## 2022-04-29 MED ORDER — SODIUM CHLORIDE 0.9 % IV SOLN: 250.0000 mL | INTRAVENOUS | Status: DC | PRN

## 2022-04-29 MED ORDER — ALBUMIN HUMAN 5 % IV SOLN: INTRAVENOUS | Status: DC | PRN

## 2022-04-29 MED ORDER — SODIUM CHLORIDE 0.9% FLUSH: 3.0000 mL | INTRAVENOUS | Status: DC | PRN

## 2022-04-29 MED ORDER — ONDANSETRON HCL 4 MG/2ML IJ SOLN
INTRAMUSCULAR | Status: DC | PRN
  Administered 2022-04-29: 4 mg via INTRAVENOUS

## 2022-04-29 MED ORDER — HEPARIN (PORCINE) IN NACL 1000-0.9 UT/500ML-% IV SOLN
INTRAVENOUS | Status: AC
  Filled 2022-04-29: qty 2000

## 2022-04-29 MED ORDER — ONDANSETRON HCL 4 MG/2ML IJ SOLN: 4.0000 mg | Freq: Four times a day (QID) | INTRAMUSCULAR | Status: DC | PRN

## 2022-04-29 MED ORDER — PHENYLEPHRINE 80 MCG/ML (10ML) SYRINGE FOR IV PUSH (FOR BLOOD PRESSURE SUPPORT)
PREFILLED_SYRINGE | INTRAVENOUS | Status: DC | PRN
  Administered 2022-04-29: 160 ug via INTRAVENOUS
  Administered 2022-04-29: 80 ug via INTRAVENOUS
  Administered 2022-04-29: 160 ug via INTRAVENOUS

## 2022-04-29 MED ORDER — COLCHICINE 0.6 MG PO TABS
0.6000 mg | ORAL_TABLET | Freq: Once | ORAL | Status: AC
  Administered 2022-04-29: 0.6 mg via ORAL
  Filled 2022-04-29: qty 1

## 2022-04-29 MED ORDER — LIDOCAINE 2% (20 MG/ML) 5 ML SYRINGE
INTRAMUSCULAR | Status: DC | PRN
  Administered 2022-04-29: 60 mg via INTRAVENOUS

## 2022-04-29 MED ORDER — ROCURONIUM BROMIDE 10 MG/ML (PF) SYRINGE
PREFILLED_SYRINGE | INTRAVENOUS | Status: DC | PRN
  Administered 2022-04-29: 50 mg via INTRAVENOUS

## 2022-04-29 MED ORDER — FENTANYL CITRATE (PF) 100 MCG/2ML IJ SOLN
INTRAMUSCULAR | Status: DC | PRN
  Administered 2022-04-29: 100 ug via INTRAVENOUS

## 2022-04-29 MED ORDER — PROPOFOL 10 MG/ML IV BOLUS
INTRAVENOUS | Status: DC | PRN
  Administered 2022-04-29: 150 mg via INTRAVENOUS

## 2022-04-29 MED ORDER — VASOPRESSIN 20 UNIT/ML IV SOLN
INTRAVENOUS | Status: DC | PRN
  Administered 2022-04-29: .5 [IU] via INTRAVENOUS
  Administered 2022-04-29 (×2): 1 [IU] via INTRAVENOUS

## 2022-04-29 SURGICAL SUPPLY — 20 items
CATH 8FR REPROCESSED SOUNDSTAR (CATHETERS) IMPLANT
CATH 8FR SOUNDSTAR REPROCESSED (CATHETERS) IMPLANT
CATH ABLAT QDOT MICRO BI TC DF (CATHETERS) ×1 IMPLANT
CATH OCTARAY 2.0 F 3-3-3-3-3 (CATHETERS) ×1 IMPLANT
CATH PIGTAIL STEERABLE D1 8.7 (WIRE) ×1 IMPLANT
CATH S CIRCA THERM PROBE 10F (CATHETERS) ×1 IMPLANT
CATH SOUNDSTAR ECO 8FR (CATHETERS) ×1 IMPLANT
CATH WEB BI DIR CSDF CRV REPRO (CATHETERS) ×1 IMPLANT
CLOSURE PERCLOSE PROSTYLE (VASCULAR PRODUCTS) ×4 IMPLANT
COVER SWIFTLINK CONNECTOR (BAG) ×2 IMPLANT
PACK EP LATEX FREE (CUSTOM PROCEDURE TRAY) ×2
PACK EP LF (CUSTOM PROCEDURE TRAY) ×1 IMPLANT
PAD DEFIB RADIO PHYSIO CONN (PAD) ×2 IMPLANT
PATCH CARTO3 (PAD) ×1 IMPLANT
SHEATH CARTO VIZIGO SM CVD (SHEATH) ×1 IMPLANT
SHEATH PINNACLE 7F 10CM (SHEATH) ×1 IMPLANT
SHEATH PINNACLE 8F 10CM (SHEATH) ×2 IMPLANT
SHEATH PINNACLE 9F 10CM (SHEATH) ×1 IMPLANT
SHEATH PROBE COVER 6X72 (BAG) ×1 IMPLANT
TUBING SMART ABLATE COOLFLOW (TUBING) ×1 IMPLANT

## 2022-04-29 NOTE — Anesthesia Procedure Notes (Addendum)
Procedure Name: Intubation Date/Time: 04/29/2022 9:49 AM  Performed by: Merryl Hacker, RNPre-anesthesia Checklist: Patient identified, Emergency Drugs available, Suction available and Patient being monitored Patient Re-evaluated:Patient Re-evaluated prior to induction Oxygen Delivery Method: Circle System Utilized Preoxygenation: Pre-oxygenation with 100% oxygen Induction Type: IV induction Ventilation: Mask ventilation without difficulty and Oral airway inserted - appropriate to patient size Laryngoscope Size: Mac and 4 Grade View: Grade I Tube type: Oral Tube size: 7.5 mm Number of attempts: 1 Airway Equipment and Method: Stylet and Oral airway Placement Confirmation: ETT inserted through vocal cords under direct vision, positive ETCO2 and breath sounds checked- equal and bilateral Secured at: 21 cm Tube secured with: Tape Dental Injury: Teeth and Oropharynx as per pre-operative assessment  Comments: Performed by Heide Scales, SRNA

## 2022-04-29 NOTE — Anesthesia Preprocedure Evaluation (Addendum)
Anesthesia Evaluation  Patient identified by MRN, date of birth, ID band Patient awake    Reviewed: Allergy & Precautions, NPO status , Patient's Chart, lab work & pertinent test results  Airway Mallampati: III  TM Distance: >3 FB Neck ROM: Full    Dental no notable dental hx.    Pulmonary neg pulmonary ROS,    Pulmonary exam normal        Cardiovascular +CHF  + dysrhythmias Atrial Fibrillation  Rhythm:Irregular Rate:Normal     Neuro/Psych negative neurological ROS  negative psych ROS   GI/Hepatic negative GI ROS, Neg liver ROS,   Endo/Other  negative endocrine ROS  Renal/GU negative Renal ROS     Musculoskeletal negative musculoskeletal ROS (+)   Abdominal   Peds  Hematology  (+) Blood dyscrasia, ,   Anesthesia Other Findings A-fib  Reproductive/Obstetrics                           Anesthesia Physical Anesthesia Plan  ASA: 3  Anesthesia Plan: General   Post-op Pain Management:    Induction: Intravenous  PONV Risk Score and Plan: 2 and Ondansetron, Dexamethasone, Midazolam and Treatment may vary due to age or medical condition  Airway Management Planned: Oral ETT  Additional Equipment:   Intra-op Plan:   Post-operative Plan: Extubation in OR  Informed Consent: I have reviewed the patients History and Physical, chart, labs and discussed the procedure including the risks, benefits and alternatives for the proposed anesthesia with the patient or authorized representative who has indicated his/her understanding and acceptance.     Dental advisory given  Plan Discussed with: CRNA  Anesthesia Plan Comments:         Anesthesia Quick Evaluation

## 2022-04-29 NOTE — Transfer of Care (Addendum)
Immediate Anesthesia Transfer of Care Note  Patient: Arthur Barrett  Procedure(s) Performed: ATRIAL FIBRILLATION ABLATION  Patient Location: Cath Lab  Anesthesia Type:General  Level of Consciousness: awake, oriented and patient cooperative  Airway & Oxygen Therapy: Patient Spontanous Breathing and Patient connected to nasal cannula oxygen  Post-op Assessment: Report given to RN, Post -op Vital signs reviewed and stable and Patient moving all extremities X 4  Post vital signs: Reviewed and stable  Last Vitals:  Vitals Value Taken Time  BP    Temp    Pulse 60 04/29/22 1230  Resp 17 04/29/22 1230  SpO2 99 % 04/29/22 1230  Vitals shown include unvalidated device data.  Last Pain:  Vitals:   04/29/22 0918  TempSrc:   PainSc: 0-No pain       Patient BP in cath lab recovery 85/59. Dr. Roanna Banning notified.   Complications: There were no known notable events for this encounter.

## 2022-04-29 NOTE — Anesthesia Postprocedure Evaluation (Signed)
Anesthesia Post Note  Patient: Arthur Barrett  Procedure(s) Performed: ATRIAL FIBRILLATION ABLATION     Patient location during evaluation: Cath Lab Anesthesia Type: General Level of consciousness: awake Pain management: pain level controlled Vital Signs Assessment: post-procedure vital signs reviewed and stable Respiratory status: spontaneous breathing, nonlabored ventilation, respiratory function stable and patient connected to nasal cannula oxygen Cardiovascular status: blood pressure returned to baseline and stable Postop Assessment: no apparent nausea or vomiting Anesthetic complications: no   There were no known notable events for this encounter.  Last Vitals:  Vitals:   04/29/22 1530 04/29/22 1534  BP: 95/69 95/68  Pulse: 64 67  Resp: (!) 24 14  Temp:    SpO2: 97% 97%    Last Pain:  Vitals:   04/29/22 1330  TempSrc:   PainSc: 0-No pain                 Sarabella Caprio P Cataleia Gade
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
## 2022-04-30 ENCOUNTER — Encounter: Payer: Self-pay | Admitting: Cardiology

## 2022-04-30 ENCOUNTER — Encounter (HOSPITAL_COMMUNITY): Payer: Self-pay | Admitting: Cardiology

## 2022-05-02 ENCOUNTER — Telehealth: Payer: Self-pay | Admitting: Physician Assistant

## 2022-05-02 NOTE — Telephone Encounter (Signed)
Pt called stating he is having "issues" after his ablation. He states all day today he has had brain fog and fatigue. He feels like he didn't get enough sleep last night.  No fever, CP, SOB, dizziness, syncope, or palpitations. His home monitor indicates he is still in sinus rhythm. HR and BP are WNL. Speech intact over the phone. He denies unilateral weakness, no facial droop. Groin site stable, no oozing. Does not sound infectious over the phone.  I indicated this is difficult to evaluate over the phone, but he is not reporting any red flag concerns. I advised he could be evaluated in the ER tonight, but was OK to wait and see how he feels tomorrow. He expressed understanding of the plan.

## 2022-05-05 ENCOUNTER — Encounter: Payer: Self-pay | Admitting: Cardiology

## 2022-05-05 ENCOUNTER — Ambulatory Visit (INDEPENDENT_AMBULATORY_CARE_PROVIDER_SITE_OTHER): Payer: PPO | Admitting: Cardiology

## 2022-05-05 VITALS — BP 130/80 | HR 55 | Ht 69.0 in | Wt 172.4 lb

## 2022-05-05 DIAGNOSIS — Z8679 Personal history of other diseases of the circulatory system: Secondary | ICD-10-CM

## 2022-05-05 DIAGNOSIS — Z9889 Other specified postprocedural states: Secondary | ICD-10-CM

## 2022-05-05 DIAGNOSIS — I480 Paroxysmal atrial fibrillation: Secondary | ICD-10-CM

## 2022-05-05 DIAGNOSIS — Z79899 Other long term (current) drug therapy: Secondary | ICD-10-CM | POA: Diagnosis not present

## 2022-05-05 NOTE — Patient Instructions (Addendum)
Medication Instructions:  Your physician recommends that you continue on your current medications as directed. Please refer to the Current Medication list given to you today.  *If you need a refill on your cardiac medications before your next appointment, please call your pharmacy*   Lab Work: Your physician recommends that you return for lab work in:  TODAY: CMET, Mag, CBC If you have labs (blood work) drawn today and your tests are completely normal, you will receive your results only by: [place] (if you have [place]) OR A paper copy in the mail If you have any lab test that is abnormal or we need to change your treatment, we will call you to review the results.   Testing/Procedures: None   Follow-Up: At [facility], you and your health needs are our priority.  As part of our continuing mission to provide you with exceptional heart care, we have created designated Provider Care Teams.  These Care Teams include your primary Cardiologist (physician) and Advanced Practice Providers (APPs -  Physician Assistants and Nurse Practitioners) who all work together to provide you with the care you need, when you need it.  We recommend signing up for the patient portal called "MyChart".  Sign up information is provided on this After Visit Summary.  MyChart is used to connect with patients for Virtual Visits (Telemedicine).  Patients are able to view lab/test results, encounter notes, upcoming appointments, etc.  Non-urgent messages can be sent to your provider as well.   To learn more about what you can do with MyChart, go to [email or web address].    Your next appointment:    December 1st at 8 am  The format for your next appointment:   In Person  Provider:   Berniece Salines, DO     Other Instructions   Important Information About Sugar
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
## 2022-05-06 LAB — CBC WITH DIFFERENTIAL/PLATELET
Basophils Absolute: 0 10*3/uL (ref 0.0–0.2)
Basos: 0 %
EOS (ABSOLUTE): 0.1 10*3/uL (ref 0.0–0.4)
Eos: 2 %
Hematocrit: 48 % (ref 37.5–51.0)
Hemoglobin: 15.9 g/dL (ref 13.0–17.7)
Immature Grans (Abs): 0 10*3/uL (ref 0.0–0.1)
Immature Granulocytes: 0 %
Lymphocytes Absolute: 1.2 10*3/uL (ref 0.7–3.1)
Lymphs: 19 %
MCH: 31.1 pg (ref 26.6–33.0)
MCHC: 33.1 g/dL (ref 31.5–35.7)
MCV: 94 fL (ref 79–97)
Monocytes Absolute: 0.9 10*3/uL (ref 0.1–0.9)
Monocytes: 14 %
Neutrophils Absolute: 4.1 10*3/uL (ref 1.4–7.0)
Neutrophils: 65 %
Platelets: 217 10*3/uL (ref 150–450)
RBC: 5.12 x10E6/uL (ref 4.14–5.80)
RDW: 12.4 % (ref 11.6–15.4)
WBC: 6.3 10*3/uL (ref 3.4–10.8)

## 2022-05-06 LAB — COMPREHENSIVE METABOLIC PANEL
ALT: 65 IU/L — ABNORMAL HIGH (ref 0–44)
AST: 25 IU/L (ref 0–40)
Albumin/Globulin Ratio: 2 (ref 1.2–2.2)
Albumin: 4.5 g/dL (ref 3.9–4.9)
Alkaline Phosphatase: 84 IU/L (ref 44–121)
BUN/Creatinine Ratio: 11 (ref 10–24)
BUN: 13 mg/dL (ref 8–27)
Bilirubin Total: 0.7 mg/dL (ref 0.0–1.2)
CO2: 26 mmol/L (ref 20–29)
Calcium: 9.3 mg/dL (ref 8.6–10.2)
Chloride: 102 mmol/L (ref 96–106)
Creatinine, Ser: 1.19 mg/dL (ref 0.76–1.27)
Globulin, Total: 2.3 g/dL (ref 1.5–4.5)
Glucose: 86 mg/dL (ref 70–99)
Potassium: 4.3 mmol/L (ref 3.5–5.2)
Sodium: 142 mmol/L (ref 134–144)
Total Protein: 6.8 g/dL (ref 6.0–8.5)
eGFR: 68 mL/min/{1.73_m2} (ref >59)

## 2022-05-06 LAB — MAGNESIUM: Magnesium: 2 mg/dL (ref 1.6–2.3)

## 2022-05-09 ENCOUNTER — Other Ambulatory Visit (HOSPITAL_COMMUNITY): Payer: Self-pay

## 2022-05-27 ENCOUNTER — Encounter (HOSPITAL_COMMUNITY): Payer: Self-pay | Admitting: Nurse Practitioner

## 2022-05-27 ENCOUNTER — Other Ambulatory Visit (HOSPITAL_COMMUNITY): Payer: Self-pay

## 2022-05-27 ENCOUNTER — Ambulatory Visit (HOSPITAL_COMMUNITY)
Admission: RE | Admit: 2022-05-27 | Discharge: 2022-05-27 | Disposition: A | Discharge status: Home / Self Care | Payer: PPO | Source: Ambulatory Visit | Attending: Nurse Practitioner | Admitting: Nurse Practitioner

## 2022-05-27 VITALS — BP 118/68 | HR 45 | Ht 69.0 in | Wt 176.0 lb

## 2022-05-27 DIAGNOSIS — R001 Bradycardia, unspecified: Secondary | ICD-10-CM | POA: Diagnosis not present

## 2022-05-27 DIAGNOSIS — D6869 Other thrombophilia: Secondary | ICD-10-CM

## 2022-05-27 DIAGNOSIS — I480 Paroxysmal atrial fibrillation: Secondary | ICD-10-CM | POA: Insufficient documentation

## 2022-05-27 DIAGNOSIS — Z79899 Other long term (current) drug therapy: Secondary | ICD-10-CM | POA: Insufficient documentation

## 2022-05-27 DIAGNOSIS — I483 Typical atrial flutter: Secondary | ICD-10-CM | POA: Insufficient documentation

## 2022-05-27 DIAGNOSIS — I4891 Unspecified atrial fibrillation: Secondary | ICD-10-CM | POA: Diagnosis not present

## 2022-05-27 DIAGNOSIS — Z7901 Long term (current) use of anticoagulants: Secondary | ICD-10-CM | POA: Diagnosis not present
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
## 2022-06-04 ENCOUNTER — Other Ambulatory Visit: Payer: Self-pay

## 2022-06-04 ENCOUNTER — Other Ambulatory Visit (HOSPITAL_COMMUNITY): Payer: Self-pay

## 2022-06-04 ENCOUNTER — Encounter: Payer: Self-pay | Admitting: Cardiology

## 2022-06-04 MED ORDER — ATORVASTATIN CALCIUM 20 MG PO TABS
20.0000 mg | ORAL_TABLET | Freq: Every day | ORAL | 3 refills | Status: DC
  Filled 2022-06-04: qty 90, 90d supply, fill #0
  Filled 2022-10-16: qty 90, 90d supply, fill #1
  Filled 2023-01-09: qty 90, 90d supply, fill #2
  Filled 2023-04-09: qty 90, 90d supply, fill #3

## 2022-06-04 NOTE — Telephone Encounter (Signed)
Routed to Dr Harriet Masson. To review and respond.

## 2022-06-04 NOTE — Telephone Encounter (Signed)
Refill sent to pharmacy.   

## 2022-06-11 ENCOUNTER — Other Ambulatory Visit (HOSPITAL_COMMUNITY): Payer: Self-pay

## 2022-06-12 ENCOUNTER — Ambulatory Visit: Payer: Self-pay

## 2022-06-12 NOTE — Patient Outreach (Signed)
  Care Coordination   06/12/2022 Name: Arthur Barrett MRN: [id number] DOB: 1956-01-13   Care Coordination Outreach Attempts:  Contact was made with the patient today to offer care coordination services as a benefit of their health plan. The patient requested a return call on a later date.   Follow Up Plan:  Additional outreach attempts will be made to offer the patient care coordination information and services.   Encounter Outcome:  Pt. Scheduled  Care Coordination Interventions Activated:  No   Care Coordination Interventions:  No, not indicated    Thea Silversmith, RN, MSN, BSN, CCM Care Coordinator [phone]

## 2022-06-13 ENCOUNTER — Ambulatory Visit: Payer: Self-pay

## 2022-06-13 NOTE — Patient Outreach (Signed)
  Care Coordination   06/13/2022 Name: Arthur Barrett MRN: [id number] DOB: 04-24-1956   Care Coordination Outreach Attempts:  A second unsuccessful outreach was attempted today to offer the patient with information about available care coordination services as a benefit of their health plan.     Follow Up Plan:  Additional outreach attempts will be made to offer the patient care coordination information and services.   Encounter Outcome:  No Answer  Care Coordination Interventions Activated:  No   Care Coordination Interventions:  No, not indicated    Thea Silversmith, RN, MSN, BSN, CCM Care Coordinator [phone]

## 2022-06-24 ENCOUNTER — Other Ambulatory Visit (HOSPITAL_COMMUNITY): Payer: Self-pay

## 2022-06-25 ENCOUNTER — Other Ambulatory Visit (HOSPITAL_COMMUNITY): Payer: Self-pay

## 2022-06-27 DIAGNOSIS — I700 Atherosclerosis of aorta: Secondary | ICD-10-CM | POA: Diagnosis not present

## 2022-06-27 DIAGNOSIS — Z79899 Other long term (current) drug therapy: Secondary | ICD-10-CM | POA: Diagnosis not present

## 2022-06-27 DIAGNOSIS — Z0000 Encounter for general adult medical examination without abnormal findings: Secondary | ICD-10-CM | POA: Diagnosis not present

## 2022-06-27 DIAGNOSIS — E785 Hyperlipidemia, unspecified: Secondary | ICD-10-CM | POA: Diagnosis not present

## 2022-06-27 DIAGNOSIS — R82998 Other abnormal findings in urine: Secondary | ICD-10-CM | POA: Diagnosis not present

## 2022-06-27 DIAGNOSIS — Z125 Encounter for screening for malignant neoplasm of prostate: Secondary | ICD-10-CM | POA: Diagnosis not present

## 2022-06-27 DIAGNOSIS — R7989 Other specified abnormal findings of blood chemistry: Secondary | ICD-10-CM | POA: Diagnosis not present

## 2022-06-28 ENCOUNTER — Other Ambulatory Visit (HOSPITAL_COMMUNITY): Payer: Self-pay

## 2022-06-30 ENCOUNTER — Other Ambulatory Visit (HOSPITAL_COMMUNITY): Payer: Self-pay

## 2022-06-30 ENCOUNTER — Ambulatory Visit: Payer: Self-pay

## 2022-06-30 MED ORDER — TAMSULOSIN HCL 0.4 MG PO CAPS
0.4000 mg | ORAL_CAPSULE | Freq: Every day | ORAL | 3 refills | Status: DC
  Filled 2022-06-30: qty 90, 90d supply, fill #0
  Filled 2022-08-18: qty 90, 90d supply, fill #1

## 2022-06-30 NOTE — Patient Outreach (Signed)
  Care Coordination   Initial Visit Note   06/30/2022 Name: Arthur Barrett MRN: [id number] DOB: 22-Apr-1956  Arthur Barrett is a 66 y.o. year old male who sees Shon Baton, MD for primary care. I spoke with  Laurence Compton by phone today.  What matters to the patients health and wellness today?  Patient denies any care coordination, resource, disease management or education needs at this time.    Goals Addressed             This Visit's Progress    COMPLETED: Care Coordination Activities-no follow up required       Care Coordination Interventions: Discussed Care Coordination services Encouraged to contact Care Coordinator and/or primary care provider if care coordination needs in the future        SDOH assessments and interventions completed:  Yes  SDOH Interventions Today    Flowsheet Row Most Recent Value  SDOH Interventions   Food Insecurity Interventions Intervention Not Indicated  Housing Interventions Intervention Not Indicated  Transportation Interventions Intervention Not Indicated        Care Coordination Interventions Activated:  Yes  Care Coordination Interventions:  Yes, provided   Follow up plan: No further intervention required.   Encounter Outcome:  Pt. Visit Completed   Thea Silversmith, RN, MSN, BSN, CCM Care Coordinator [phone]

## 2022-06-30 NOTE — Patient Instructions (Signed)
Visit Information  Thank you for taking time to visit with me today. Please don't hesitate to contact me at [phone] or your primary care provider, if I can be of assistance to you.   Following are the goals we discussed today:   Goals Addressed             This Visit's Progress    COMPLETED: Care Coordination Activities-no follow up required       Care Coordination Interventions: Discussed Care Coordination services Encouraged to contact Care Coordinator and/or primary care provider if care coordination needs in the future        If you are experiencing a Mental Health or [place] or need someone to talk to, please call the Suicide and Crisis Lifeline: 988  Patient verbalizes understanding of instructions and care plan provided today and agrees to view in [place]. Active MyChart status and patient understanding of how to access instructions and care plan via MyChart confirmed with patient.     Thea Silversmith, RN, MSN, BSN, CCM Care Coordinator [phone]

## 2022-07-04 DIAGNOSIS — Z23 Encounter for immunization: Secondary | ICD-10-CM | POA: Diagnosis not present

## 2022-07-04 DIAGNOSIS — I700 Atherosclerosis of aorta: Secondary | ICD-10-CM | POA: Diagnosis not present

## 2022-07-04 DIAGNOSIS — Z0000 Encounter for general adult medical examination without abnormal findings: Secondary | ICD-10-CM | POA: Diagnosis not present

## 2022-07-04 DIAGNOSIS — I252 Old myocardial infarction: Secondary | ICD-10-CM | POA: Diagnosis not present

## 2022-07-04 DIAGNOSIS — E785 Hyperlipidemia, unspecified: Secondary | ICD-10-CM | POA: Diagnosis not present

## 2022-07-04 DIAGNOSIS — I4892 Unspecified atrial flutter: Secondary | ICD-10-CM | POA: Diagnosis not present

## 2022-07-04 DIAGNOSIS — Z7901 Long term (current) use of anticoagulants: Secondary | ICD-10-CM | POA: Diagnosis not present

## 2022-07-04 DIAGNOSIS — Z636 Dependent relative needing care at home: Secondary | ICD-10-CM | POA: Diagnosis not present

## 2022-07-04 DIAGNOSIS — M79675 Pain in left toe(s): Secondary | ICD-10-CM | POA: Diagnosis not present

## 2022-07-04 DIAGNOSIS — C61 Malignant neoplasm of prostate: Secondary | ICD-10-CM | POA: Diagnosis not present

## 2022-07-04 DIAGNOSIS — D6869 Other thrombophilia: Secondary | ICD-10-CM | POA: Diagnosis not present

## 2022-07-04 DIAGNOSIS — I5020 Unspecified systolic (congestive) heart failure: Secondary | ICD-10-CM | POA: Diagnosis not present

## 2022-07-11 ENCOUNTER — Other Ambulatory Visit: Payer: Self-pay | Admitting: Cardiology

## 2022-07-11 DIAGNOSIS — I4891 Unspecified atrial fibrillation: Secondary | ICD-10-CM

## 2022-07-14 ENCOUNTER — Other Ambulatory Visit (HOSPITAL_COMMUNITY): Payer: Self-pay

## 2022-07-14 DIAGNOSIS — N200 Calculus of kidney: Secondary | ICD-10-CM | POA: Diagnosis not present

## 2022-07-14 DIAGNOSIS — N5201 Erectile dysfunction due to arterial insufficiency: Secondary | ICD-10-CM | POA: Diagnosis not present

## 2022-07-14 DIAGNOSIS — R3915 Urgency of urination: Secondary | ICD-10-CM | POA: Diagnosis not present

## 2022-07-14 DIAGNOSIS — C61 Malignant neoplasm of prostate: Secondary | ICD-10-CM | POA: Diagnosis not present

## 2022-07-14 MED ORDER — APIXABAN 5 MG PO TABS
5.0000 mg | ORAL_TABLET | Freq: Two times a day (BID) | ORAL | 2 refills | Status: DC
  Filled 2022-07-14: qty 60, 30d supply, fill #0
  Filled 2022-08-08: qty 60, 30d supply, fill #1
  Filled 2022-08-25 – 2022-09-07 (×2): qty 60, 30d supply, fill #2

## 2022-07-14 NOTE — Telephone Encounter (Signed)
Prescription refill request for Eliquis received. Indication: afib  Last office visit: Arthur Barrett 05/27/2022, [place] 05/05/2022 Scr: 1.19, 05/05/2022 Age: 66 yo  Weight: 79.8 kg   Refill sent.

## 2022-08-04 ENCOUNTER — Ambulatory Visit: Payer: PPO | Attending: Cardiology | Admitting: Cardiology

## 2022-08-04 ENCOUNTER — Other Ambulatory Visit (HOSPITAL_COMMUNITY): Payer: Self-pay

## 2022-08-04 ENCOUNTER — Encounter: Payer: Self-pay | Admitting: Cardiology

## 2022-08-04 VITALS — BP 132/84 | HR 43 | Ht 69.0 in | Wt 179.8 lb

## 2022-08-04 DIAGNOSIS — I5022 Chronic systolic (congestive) heart failure: Secondary | ICD-10-CM

## 2022-08-04 DIAGNOSIS — I480 Paroxysmal atrial fibrillation: Secondary | ICD-10-CM | POA: Diagnosis not present

## 2022-08-04 DIAGNOSIS — D6869 Other thrombophilia: Secondary | ICD-10-CM

## 2022-08-04 MED ORDER — METOPROLOL SUCCINATE ER 50 MG PO TB24
50.0000 mg | ORAL_TABLET | Freq: Every day | ORAL | 2 refills | Status: DC
  Filled 2022-08-04: qty 90, 90d supply, fill #0
  Filled 2022-10-28: qty 90, 90d supply, fill #1
  Filled 2023-01-26: qty 90, 90d supply, fill #2
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
## 2022-08-04 NOTE — Patient Instructions (Addendum)
Medication Instructions:  Your physician has recommended you make the following change in your medication:  STOP Amiodarone CHANGE the way you take Metoprolol (Toprol) - change to 50 mg once daily at bedtime  *If you need a refill on your cardiac medications before your next appointment, please call your pharmacy*   Lab Work: None ordered   Testing/Procedures: Your physician has requested that you have an echocardiogram. Echocardiography is a painless test that uses sound waves to create images of your heart. It provides your doctor with information about the size and shape of your heart and how well your heart's chambers and valves are working. This procedure takes approximately one hour. There are no restrictions for this procedure. Please do NOT wear cologne, perfume, aftershave, or lotions (deodorant is allowed). Please arrive 15 minutes prior to your appointment time.    Follow-Up: At [facility], you and your health needs are our priority.  As part of our continuing mission to provide you with exceptional heart care, we have created designated Provider Care Teams.  These Care Teams include your primary Cardiologist (physician) and Advanced Practice Providers (APPs -  Physician Assistants and Nurse Practitioners) who all work together to provide you with the care you need, when you need it.  Your next appointment:   6 month(s)  The format for your next appointment:   In Person  Provider:   Allegra Lai, MD    Thank you for choosing [facility]!!   Trinidad Curet, RN [phone]  Other Instructions   Important Information About Sugar

## 2022-08-08 ENCOUNTER — Other Ambulatory Visit (HOSPITAL_COMMUNITY): Payer: Self-pay

## 2022-08-14 ENCOUNTER — Ambulatory Visit: Payer: PPO | Attending: Cardiology

## 2022-08-14 DIAGNOSIS — I5022 Chronic systolic (congestive) heart failure: Secondary | ICD-10-CM | POA: Diagnosis not present

## 2022-08-14 DIAGNOSIS — I480 Paroxysmal atrial fibrillation: Secondary | ICD-10-CM | POA: Diagnosis not present

## 2022-08-14 LAB — ECHOCARDIOGRAM COMPLETE
Area-P 1/2: 4.36 cm2
MV M vel: 4.81 m/s
MV Peak grad: 92.5 mmHg
S' Lateral: 3 cm

## 2022-08-18 ENCOUNTER — Other Ambulatory Visit (HOSPITAL_COMMUNITY): Payer: Self-pay

## 2022-08-18 MED ORDER — TAMSULOSIN HCL 0.4 MG PO CAPS
0.4000 mg | ORAL_CAPSULE | Freq: Every day | ORAL | 3 refills | Status: DC
  Filled 2022-08-18 – 2022-09-19 (×4): qty 90, 90d supply, fill #0
  Filled 2022-12-16: qty 90, 90d supply, fill #1
  Filled 2023-03-16: qty 90, 90d supply, fill #2
  Filled 2023-06-16: qty 90, 90d supply, fill #3

## 2022-08-26 ENCOUNTER — Other Ambulatory Visit (HOSPITAL_COMMUNITY): Payer: Self-pay

## 2022-08-29 ENCOUNTER — Other Ambulatory Visit (HOSPITAL_COMMUNITY): Payer: Self-pay

## 2022-09-04 ENCOUNTER — Other Ambulatory Visit (HOSPITAL_COMMUNITY): Payer: Self-pay

## 2022-09-04 MED ORDER — ONDANSETRON 4 MG PO TBDP
4.0000 mg | ORAL_TABLET | Freq: Two times a day (BID) | ORAL | 0 refills | Status: DC | PRN
  Filled 2022-09-04: qty 12, 6d supply, fill #0

## 2022-09-05 ENCOUNTER — Other Ambulatory Visit (HOSPITAL_COMMUNITY): Payer: Self-pay

## 2022-09-07 ENCOUNTER — Other Ambulatory Visit (HOSPITAL_COMMUNITY): Payer: Self-pay

## 2022-09-08 ENCOUNTER — Other Ambulatory Visit (HOSPITAL_COMMUNITY): Payer: Self-pay

## 2022-09-19 ENCOUNTER — Encounter: Payer: Self-pay | Admitting: Cardiology

## 2022-09-19 ENCOUNTER — Ambulatory Visit: Payer: PPO | Attending: Cardiology | Admitting: Cardiology

## 2022-09-19 ENCOUNTER — Other Ambulatory Visit (HOSPITAL_COMMUNITY): Payer: Self-pay

## 2022-09-19 VITALS — BP 120/80 | HR 52 | Ht 69.5 in | Wt 184.8 lb

## 2022-09-19 DIAGNOSIS — E782 Mixed hyperlipidemia: Secondary | ICD-10-CM

## 2022-09-19 DIAGNOSIS — I480 Paroxysmal atrial fibrillation: Secondary | ICD-10-CM

## 2022-09-19 DIAGNOSIS — I483 Typical atrial flutter: Secondary | ICD-10-CM | POA: Diagnosis not present

## 2022-09-19 NOTE — Patient Instructions (Signed)
Medication Instructions:  Your physician recommends that you continue on your current medications as directed. Please refer to the Current Medication list given to you today.  *If you need a refill on your cardiac medications before your next appointment, please call your pharmacy*   Lab Work: NONE If you have labs (blood work) drawn today and your tests are completely normal, you will receive your results only by: MyChart Message (if you have MyChart) OR A paper copy in the mail If you have any lab test that is abnormal or we need to change your treatment, we will call you to review the results.   Testing/Procedures: NONE   Follow-Up: At [facility], you and your health needs are our priority.  As part of our continuing mission to provide you with exceptional heart care, we have created designated Provider Care Teams.  These Care Teams include your primary Cardiologist (physician) and Advanced Practice Providers (APPs -  Physician Assistants and Nurse Practitioners) who all work together to provide you with the care you need, when you need it.  We recommend signing up for the patient portal called "MyChart".  Sign up information is provided on this After Visit Summary.  MyChart is used to connect with patients for Virtual Visits (Telemedicine).  Patients are able to view lab/test results, encounter notes, upcoming appointments, etc.  Non-urgent messages can be sent to your provider as well.   To learn more about what you can do with MyChart, go to [email or web address].    Your next appointment:   1 year(s)  The format for your next appointment:   In Person  Provider:   Kardie Tobb, DO   
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
## 2022-09-20 ENCOUNTER — Other Ambulatory Visit (HOSPITAL_COMMUNITY): Payer: Self-pay

## 2022-09-22 ENCOUNTER — Other Ambulatory Visit (HOSPITAL_COMMUNITY): Payer: Self-pay

## 2022-09-22 MED ORDER — TRAZODONE HCL 50 MG PO TABS
50.0000 mg | ORAL_TABLET | Freq: Every day | ORAL | 3 refills | Status: DC
Start: 2022-09-22 — End: 2023-03-20
  Filled 2022-09-22: qty 30, 30d supply, fill #0

## 2022-09-25 ENCOUNTER — Other Ambulatory Visit (HOSPITAL_COMMUNITY): Payer: Self-pay

## 2022-09-26 ENCOUNTER — Other Ambulatory Visit (HOSPITAL_COMMUNITY): Payer: Self-pay

## 2022-10-16 ENCOUNTER — Other Ambulatory Visit: Payer: Self-pay

## 2022-10-16 ENCOUNTER — Other Ambulatory Visit: Payer: Self-pay | Admitting: Cardiology

## 2022-10-16 ENCOUNTER — Other Ambulatory Visit (HOSPITAL_COMMUNITY): Payer: Self-pay

## 2022-10-16 DIAGNOSIS — I4891 Unspecified atrial fibrillation: Secondary | ICD-10-CM

## 2022-10-16 MED ORDER — APIXABAN 5 MG PO TABS
5.0000 mg | ORAL_TABLET | Freq: Two times a day (BID) | ORAL | 2 refills | Status: DC
  Filled 2022-10-16: qty 60, 30d supply, fill #0
  Filled 2022-11-11: qty 60, 30d supply, fill #1
  Filled 2022-12-11: qty 60, 30d supply, fill #2

## 2022-10-16 NOTE — Telephone Encounter (Signed)
Prescription refill request for Eliquis received.  Indication: afib  Last office visit: Tobb, 09/19/2022 Scr: 1.19, 05/05/2022 Age: 66 yo  Weight:  83.8 kg   Refill sent.

## 2022-10-28 ENCOUNTER — Other Ambulatory Visit (HOSPITAL_COMMUNITY): Payer: Self-pay

## 2022-11-11 ENCOUNTER — Other Ambulatory Visit (HOSPITAL_COMMUNITY): Payer: Self-pay

## 2022-11-26 ENCOUNTER — Other Ambulatory Visit: Payer: Self-pay | Admitting: Urology

## 2022-11-26 DIAGNOSIS — C61 Malignant neoplasm of prostate: Secondary | ICD-10-CM

## 2022-11-27 ENCOUNTER — Encounter (HOSPITAL_COMMUNITY): Payer: Self-pay | Admitting: *Deleted

## 2022-12-11 ENCOUNTER — Other Ambulatory Visit (HOSPITAL_COMMUNITY): Payer: Self-pay

## 2022-12-16 ENCOUNTER — Other Ambulatory Visit (HOSPITAL_COMMUNITY): Payer: Self-pay

## 2022-12-20 ENCOUNTER — Other Ambulatory Visit (HOSPITAL_COMMUNITY): Payer: Self-pay

## 2022-12-20 MED ORDER — MYRBETRIQ 25 MG PO TB24
25.0000 mg | ORAL_TABLET | Freq: Every day | ORAL | 3 refills | Status: DC
  Filled 2022-12-20: qty 90, 90d supply, fill #0
  Filled 2023-03-16 – 2023-03-26 (×3): qty 90, 90d supply, fill #1
  Filled 2023-06-20: qty 90, 90d supply, fill #2
  Filled 2023-09-18: qty 90, 90d supply, fill #3

## 2022-12-22 ENCOUNTER — Other Ambulatory Visit (HOSPITAL_COMMUNITY): Payer: Self-pay

## 2022-12-22 ENCOUNTER — Other Ambulatory Visit: Payer: Self-pay

## 2023-01-09 ENCOUNTER — Other Ambulatory Visit: Payer: Self-pay | Admitting: Cardiology

## 2023-01-09 ENCOUNTER — Other Ambulatory Visit: Payer: Self-pay

## 2023-01-09 ENCOUNTER — Other Ambulatory Visit (HOSPITAL_COMMUNITY): Payer: Self-pay

## 2023-01-09 DIAGNOSIS — I4891 Unspecified atrial fibrillation: Secondary | ICD-10-CM

## 2023-01-09 MED ORDER — APIXABAN 5 MG PO TABS
5.0000 mg | ORAL_TABLET | Freq: Two times a day (BID) | ORAL | 5 refills | Status: DC
  Filled 2023-01-09: qty 60, 30d supply, fill #0
  Filled 2023-02-06: qty 60, 30d supply, fill #1
  Filled 2023-04-20: qty 60, 30d supply, fill #2
  Filled 2023-05-17: qty 60, 30d supply, fill #3
  Filled 2023-06-16: qty 60, 30d supply, fill #4
  Filled 2023-07-16: qty 60, 30d supply, fill #5

## 2023-01-09 NOTE — Telephone Encounter (Signed)
Prescription refill request for Eliquis received.  Indication: afib  Last office visit: Tobb 09/19/2022 Scr: 1.19, 05/05/2022 Age: 67 yo  Weight: 83.8 kg   Refill sent.

## 2023-01-12 ENCOUNTER — Other Ambulatory Visit: Payer: Self-pay

## 2023-01-13 ENCOUNTER — Other Ambulatory Visit (HOSPITAL_COMMUNITY): Payer: Self-pay

## 2023-01-13 ENCOUNTER — Ambulatory Visit
Admission: RE | Admit: 2023-01-13 | Discharge: 2023-01-13 | Disposition: A | Discharge status: Home / Self Care | Payer: PPO | Source: Ambulatory Visit | Attending: Urology | Admitting: Urology

## 2023-01-13 DIAGNOSIS — C61 Malignant neoplasm of prostate: Secondary | ICD-10-CM | POA: Diagnosis not present

## 2023-01-13 MED ORDER — GADOPICLENOL 0.5 MMOL/ML IV SOLN
9.0000 mL | Freq: Once | INTRAVENOUS | Status: AC | PRN
  Administered 2023-01-13: 9 mL via INTRAVENOUS

## 2023-01-21 DIAGNOSIS — C61 Malignant neoplasm of prostate: Secondary | ICD-10-CM | POA: Diagnosis not present

## 2023-01-27 ENCOUNTER — Other Ambulatory Visit: Payer: Self-pay

## 2023-01-28 ENCOUNTER — Other Ambulatory Visit (HOSPITAL_COMMUNITY): Payer: Self-pay

## 2023-01-28 ENCOUNTER — Other Ambulatory Visit: Payer: Self-pay

## 2023-01-28 DIAGNOSIS — N5201 Erectile dysfunction due to arterial insufficiency: Secondary | ICD-10-CM | POA: Diagnosis not present

## 2023-01-28 DIAGNOSIS — C61 Malignant neoplasm of prostate: Secondary | ICD-10-CM | POA: Diagnosis not present

## 2023-01-28 DIAGNOSIS — R3915 Urgency of urination: Secondary | ICD-10-CM | POA: Diagnosis not present

## 2023-01-28 MED ORDER — LEVOFLOXACIN 750 MG PO TABS
750.0000 mg | ORAL_TABLET | ORAL | 0 refills | Status: DC
Start: 2023-01-28 — End: 2023-03-20
  Filled 2023-01-28: qty 1, 1d supply, fill #0

## 2023-01-28 MED ORDER — LEVOFLOXACIN 750 MG PO TABS
750.0000 mg | ORAL_TABLET | Freq: Every day | ORAL | 0 refills | Status: DC
  Filled 2023-01-28: qty 1, 1d supply, fill #0

## 2023-01-29 ENCOUNTER — Telehealth: Payer: Self-pay | Admitting: *Deleted

## 2023-01-29 ENCOUNTER — Other Ambulatory Visit (HOSPITAL_COMMUNITY): Payer: Self-pay

## 2023-01-29 ENCOUNTER — Encounter: Payer: Self-pay | Admitting: Cardiology

## 2023-01-29 ENCOUNTER — Other Ambulatory Visit: Payer: Self-pay

## 2023-01-29 MED ORDER — ALPRAZOLAM 0.25 MG PO TABS
0.2500 mg | ORAL_TABLET | Freq: Every evening | ORAL | 0 refills | Status: DC | PRN
  Filled 2023-01-29: qty 30, 30d supply, fill #0

## 2023-01-29 NOTE — Telephone Encounter (Signed)
   Pre-operative Risk Assessment    Patient Name: Arthur Barrett  DOB: 1956/06/21 MRN: [id number]      Request for Surgical Clearance    Procedure:   MRI FUSION  Date of Surgery:  Clearance TBD  ASAP                               Surgeon:  DR. MATTHEW GAY Surgeon's Group or Practice Name:  [facility] Phone number:  [phone] EXT 5347 ATTNCeline Mans, CMA Fax number:  [phone] AND [phone]   Type of Clearance Requested:   - Medical  - Pharmacy:  Hold Apixaban (Eliquis) x 3 DAYS PRIOR   Type of Anesthesia:  Not Indicated   Additional requests/questions:    [name]   01/29/2023, 5:47 PM

## 2023-01-30 ENCOUNTER — Telehealth: Payer: Self-pay

## 2023-01-30 NOTE — Telephone Encounter (Signed)
Patient with diagnosis of afib on Eliquis for anticoagulation.    Procedure: MRI fusion Date of procedure: TBD ASAP  CHA2DS2-VASc Score = 3  This indicates a 3.2% annual risk of stroke. The patient's score is based upon: CHF History: 1 HTN History: 0 Diabetes History: 0 Stroke History: 0 Vascular Disease History: 1 Age Score: 1 Gender Score: 0  CrCl 61mL/min Platelet count 217K  Per office protocol, patient can hold Eliquis for 3 days prior to procedure.    **This guidance is not considered finalized until pre-operative APP has relayed final recommendations.**

## 2023-01-30 NOTE — Telephone Encounter (Signed)
Patient scheduled for tele visit on 02/05/23. Med rec and consent done  

## 2023-01-30 NOTE — Telephone Encounter (Signed)
   Name: Arthur Barrett  DOB: 01/29/1956  MRN: [id number]  Primary Cardiologist: Thomasene Ripple, DO   Preoperative team, please contact this patient and set up a phone call appointment for further preoperative risk assessment. Please obtain consent and complete medication review. Thank you for your help.  I confirm that guidance regarding antiplatelet and oral anticoagulation therapy has been completed and, if necessary, noted below. Per Pharm D: Patient with diagnosis of afib on Eliquis for anticoagulation.     Procedure: MRI fusion Date of procedure: TBD ASAP   Per office protocol, patient can hold Eliquis for 3 days prior to procedure.     [name], NP 01/30/2023, 1:05 PM [facility]

## 2023-01-30 NOTE — Telephone Encounter (Signed)
  Patient Consent for Virtual Visit         Arthur Barrett has provided verbal consent on 01/30/2023 for a virtual visit (video or telephone).   CONSENT FOR VIRTUAL VISIT FOR:  Arthur Barrett  By participating in this virtual visit I agree to the following:  I hereby voluntarily request, consent and authorize [facility] and its employed or contracted physicians, physician assistants, nurse practitioners or other licensed health care professionals (the Practitioner), to provide me with telemedicine health care services (the "Services") as deemed necessary by the treating Practitioner. I acknowledge and consent to receive the Services by the Practitioner via telemedicine. I understand that the telemedicine visit will involve communicating with the Practitioner through live audiovisual communication technology and the disclosure of certain medical information by electronic transmission. I acknowledge that I have been given the opportunity to request an in-person assessment or other available alternative prior to the telemedicine visit and am voluntarily participating in the telemedicine visit.  I understand that I have the right to withhold or withdraw my consent to the use of telemedicine in the course of my care at any time, without affecting my right to future care or treatment, and that the Practitioner or I may terminate the telemedicine visit at any time. I understand that I have the right to inspect all information obtained and/or recorded in the course of the telemedicine visit and may receive copies of available information for a reasonable fee.  I understand that some of the potential risks of receiving the Services via telemedicine include:  Delay or interruption in medical evaluation due to technological equipment failure or disruption; Information transmitted may not be sufficient (e.g. poor resolution of images) to allow for appropriate medical decision making by the  Practitioner; and/or  In rare instances, security protocols could fail, causing a breach of personal health information.  Furthermore, I acknowledge that it is my responsibility to provide information about my medical history, conditions and care that is complete and accurate to the best of my ability. I acknowledge that Practitioner's advice, recommendations, and/or decision may be based on factors not within their control, such as incomplete or inaccurate data provided by me or distortions of diagnostic images or specimens that may result from electronic transmissions. I understand that the practice of medicine is not an exact science and that Practitioner makes no warranties or guarantees regarding treatment outcomes. I acknowledge that a copy of this consent can be made available to me via my patient portal [facility] MyChart), or I can request a printed copy by calling the office of [facility].    I understand that my insurance will be billed for this visit.   I have read or had this consent read to me. I understand the contents of this consent, which adequately explains the benefits and risks of the Services being provided via telemedicine.  I have been provided ample opportunity to ask questions regarding this consent and the Services and have had my questions answered to my satisfaction. I give my informed consent for the services to be provided through the use of telemedicine in my medical care
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
## 2023-02-04 ENCOUNTER — Other Ambulatory Visit: Payer: Self-pay

## 2023-02-04 ENCOUNTER — Other Ambulatory Visit (HOSPITAL_COMMUNITY): Payer: Self-pay

## 2023-02-05 ENCOUNTER — Ambulatory Visit: Payer: PPO | Attending: Cardiovascular Disease | Admitting: Nurse Practitioner

## 2023-02-05 ENCOUNTER — Encounter: Payer: Self-pay | Admitting: Nurse Practitioner

## 2023-02-05 DIAGNOSIS — Z01810 Encounter for preprocedural cardiovascular examination: Secondary | ICD-10-CM

## 2023-02-06 ENCOUNTER — Other Ambulatory Visit: Payer: Self-pay

## 2023-02-06 ENCOUNTER — Telehealth: Payer: Self-pay | Admitting: Cardiology

## 2023-02-06 NOTE — Telephone Encounter (Signed)
Refer to 4/11 encounter:  Arthur Barrett with [facility] is following up. She states the clearance recommendation has not been received and she is requesting to have it faxed to [phone].

## 2023-02-06 NOTE — Telephone Encounter (Signed)
Refaxed clearance note from 4/11 to requesting office

## 2023-02-18 DIAGNOSIS — C61 Malignant neoplasm of prostate: Secondary | ICD-10-CM | POA: Diagnosis not present

## 2023-03-04 ENCOUNTER — Other Ambulatory Visit (HOSPITAL_COMMUNITY): Payer: Self-pay

## 2023-03-05 ENCOUNTER — Other Ambulatory Visit (HOSPITAL_COMMUNITY): Payer: Self-pay

## 2023-03-05 DIAGNOSIS — R3915 Urgency of urination: Secondary | ICD-10-CM | POA: Diagnosis not present

## 2023-03-05 DIAGNOSIS — C61 Malignant neoplasm of prostate: Secondary | ICD-10-CM | POA: Diagnosis not present

## 2023-03-05 MED ORDER — ALPRAZOLAM 0.25 MG PO TABS
0.2500 mg | ORAL_TABLET | Freq: Every evening | ORAL | 0 refills | Status: DC | PRN
  Filled 2023-03-05: qty 30, 30d supply, fill #0

## 2023-03-06 ENCOUNTER — Other Ambulatory Visit (HOSPITAL_COMMUNITY): Payer: Self-pay

## 2023-03-06 MED ORDER — ALPRAZOLAM 0.25 MG PO TABS
0.2500 mg | ORAL_TABLET | Freq: Every evening | ORAL | 3 refills | Status: DC | PRN
  Filled 2023-03-06 – 2023-04-02 (×2): qty 30, 30d supply, fill #0
  Filled 2023-05-02: qty 30, 30d supply, fill #1
  Filled 2023-05-31: qty 30, 30d supply, fill #2
  Filled 2023-06-28 – 2023-06-29 (×2): qty 30, 30d supply, fill #3

## 2023-03-09 ENCOUNTER — Telehealth: Payer: Self-pay | Admitting: Emergency Medicine

## 2023-03-09 NOTE — Telephone Encounter (Signed)
Patient's spouse was on research study.  Patient called to ask about treatment options for prostate cancer.  Patient is coming to see Dr. Kathrynn Running on 5/31 to discuss radiation.  Patient is not eligible for any research studies at this time, verbalizes understanding.  Patient denies any further questions or concerns at this time.  Lurena Joiner 'Warden Fillers' Dairl Ponder, RN, BSN, [facility] Clinical Research Nurse I 03/09/23 9:49 AM

## 2023-03-17 ENCOUNTER — Other Ambulatory Visit (HOSPITAL_COMMUNITY): Payer: Self-pay

## 2023-03-17 ENCOUNTER — Other Ambulatory Visit: Payer: Self-pay

## 2023-03-17 NOTE — Progress Notes (Signed)
GU Location of Tumor / Histology: Prostate Ca  If Prostate Cancer, Gleason Score is (3 + 4) and PSA is (2.35 01/21/2023)  Biopsies      Past/Anticipated interventions by urology, if any:     Past/Anticipated interventions by medical oncology, if any: NA  Weight changes, if any:  No  IPSS:  6 SHIM:  8  Bowel/Bladder complaints, if any:    Nausea/Vomiting, if any: No  Pain issues, if any:    SAFETY ISSUES: Prior radiation? No Pacemaker/ICD? No Possible current pregnancy? Male Is the patient on methotrexate? No  Current Complaints / other details:  No

## 2023-03-19 DIAGNOSIS — C61 Malignant neoplasm of prostate: Secondary | ICD-10-CM | POA: Insufficient documentation

## 2023-03-20 ENCOUNTER — Ambulatory Visit
Admission: RE | Admit: 2023-03-20 | Discharge: 2023-03-20 | Disposition: A | Discharge status: Home / Self Care | Payer: PPO | Source: Ambulatory Visit | Attending: Radiation Oncology | Admitting: Radiation Oncology

## 2023-03-20 VITALS — BP 117/75 | HR 89 | Temp 97.9°F | Resp 18 | Ht 69.0 in | Wt 174.8 lb

## 2023-03-20 DIAGNOSIS — C61 Malignant neoplasm of prostate: Secondary | ICD-10-CM

## 2023-03-20 DIAGNOSIS — Z87442 Personal history of urinary calculi: Secondary | ICD-10-CM | POA: Insufficient documentation

## 2023-03-20 DIAGNOSIS — Z7901 Long term (current) use of anticoagulants: Secondary | ICD-10-CM | POA: Insufficient documentation

## 2023-03-20 DIAGNOSIS — Z8616 Personal history of COVID-19: Secondary | ICD-10-CM | POA: Diagnosis not present

## 2023-03-20 DIAGNOSIS — Z191 Hormone sensitive malignancy status: Secondary | ICD-10-CM | POA: Diagnosis not present

## 2023-03-20 DIAGNOSIS — Z79899 Other long term (current) drug therapy: Secondary | ICD-10-CM | POA: Diagnosis not present

## 2023-03-20 DIAGNOSIS — I4891 Unspecified atrial fibrillation: Secondary | ICD-10-CM | POA: Insufficient documentation

## 2023-03-20 NOTE — Progress Notes (Signed)
Introduced myself to the patient as the prostate nurse navigator.  No barriers to care identified at this time.  He is here to discuss his radiation treatment options.  I gave him my business card and asked him to call me with questions or concerns.  Verbalized understanding.  ?
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
## 2023-03-22 DIAGNOSIS — Z191 Hormone sensitive malignancy status: Secondary | ICD-10-CM | POA: Diagnosis not present

## 2023-03-22 DIAGNOSIS — C61 Malignant neoplasm of prostate: Secondary | ICD-10-CM | POA: Diagnosis not present

## 2023-03-26 ENCOUNTER — Other Ambulatory Visit: Payer: Self-pay

## 2023-03-26 ENCOUNTER — Other Ambulatory Visit (HOSPITAL_COMMUNITY): Payer: Self-pay

## 2023-03-27 ENCOUNTER — Other Ambulatory Visit: Payer: Self-pay

## 2023-04-02 ENCOUNTER — Other Ambulatory Visit (HOSPITAL_COMMUNITY): Payer: Self-pay

## 2023-04-02 DIAGNOSIS — C61 Malignant neoplasm of prostate: Secondary | ICD-10-CM | POA: Diagnosis not present

## 2023-04-02 DIAGNOSIS — N200 Calculus of kidney: Secondary | ICD-10-CM | POA: Diagnosis not present

## 2023-04-02 DIAGNOSIS — R3915 Urgency of urination: Secondary | ICD-10-CM | POA: Diagnosis not present

## 2023-04-02 DIAGNOSIS — N5201 Erectile dysfunction due to arterial insufficiency: Secondary | ICD-10-CM | POA: Diagnosis not present

## 2023-04-03 NOTE — Progress Notes (Signed)
Patient was a recent consult on 5/31 for his stage T2a adenocarcinoma of the prostate with Gleason score of 3+4, and PSA of 2.35, and was leaning towards brachy but wanted to take some additional time.   Patient had visit with Dr. Cardell Peach on 6/13 and has confirmed he would like to move forward with brachytherapy.   Plan of care in progress.

## 2023-04-07 ENCOUNTER — Telehealth: Payer: Self-pay | Admitting: *Deleted

## 2023-04-07 NOTE — Telephone Encounter (Signed)
CALLED PATIENT TO ASK QUESTIONS, SPOKE WITH PATIENT 

## 2023-04-09 ENCOUNTER — Other Ambulatory Visit: Payer: Self-pay

## 2023-04-13 ENCOUNTER — Telehealth: Payer: Self-pay | Admitting: Cardiology

## 2023-04-13 ENCOUNTER — Other Ambulatory Visit: Payer: Self-pay | Admitting: Urology

## 2023-04-13 NOTE — Telephone Encounter (Signed)
   Pre-operative Risk Assessment    Patient Name: Arthur Barrett  DOB: 01/22/1956 MRN: [id number]      Request for Surgical Clearance    Procedure:   Brachytherapy of the Prostate and OAR  Date of Surgery:  Clearance 07/06/23                                 Surgeon: Dr. Jettie Pagan Surgeon's Group or Practice Name:  [facility]  Phone number:  [phone] Fax number:  [phone]   Type of Clearance Requested:   - Pharmacy:  Hold Apixaban (Eliquis) Hold for 3 days    Type of Anesthesia:  General    Additional requests/questions:  Please advise surgeon/provider what medications should be held.  Signed, Windy Fast Bolick   04/13/2023, 4:11 PM

## 2023-04-14 ENCOUNTER — Telehealth: Payer: Self-pay | Admitting: *Deleted

## 2023-04-14 ENCOUNTER — Telehealth: Payer: Self-pay

## 2023-04-14 NOTE — Telephone Encounter (Signed)
Spoke with patient who is agreeable to do a tele visit on 9/3 at 9:20 am. Consent given, med rec to be completed.

## 2023-04-14 NOTE — Telephone Encounter (Signed)
Patient with diagnosis of afib on Eliquis for anticoagulation.    Procedure: Brachytherapy of the Prostate and OAR  Date of procedure: 07/06/23  CHA2DS2-VASc Score = 3  This indicates a 3.2% annual risk of stroke. The patient's score is based upon: CHF History: 1 HTN History: 0 Diabetes History: 0 Stroke History: 0 Vascular Disease History: 1 Age Score: 1 Gender Score: 0   CrCl 41mL/min Platelet count 217K  Per office protocol, patient can hold Eliquis for 3 days prior to procedure as requested.  **This guidance is not considered finalized until pre-operative APP has relayed final recommendations.**

## 2023-04-14 NOTE — Telephone Encounter (Signed)
Called patient to update, spoke with patient. 

## 2023-04-14 NOTE — Telephone Encounter (Signed)
   Name: ZAQUAN DUFFNER  DOB: [birth year]  MRN: [id number]  Primary Cardiologist: Thomasene Ripple, DO   Preoperative team, please contact this patient and set up a phone call appointment for further preoperative risk assessment. Please obtain consent and complete medication review. Thank you for your help.  I confirm that guidance regarding antiplatelet and oral anticoagulation therapy has been completed and, if necessary, noted below.  Pharmacy has provided recommendations for anticoagulation hold.   Ronney Asters, NP 04/14/2023, 10:04 AM [facility]

## 2023-04-14 NOTE — Telephone Encounter (Signed)
  Patient Consent for Virtual Visit        Arthur Barrett has provided verbal consent on 04/14/2023 for a virtual visit (video or telephone).   CONSENT FOR VIRTUAL VISIT FOR:  Arthur Barrett  By participating in this virtual visit I agree to the following:  I hereby voluntarily request, consent and authorize [facility] and its employed or contracted physicians, physician assistants, nurse practitioners or other licensed health care professionals (the Practitioner), to provide me with telemedicine health care services (the "Services") as deemed necessary by the treating Practitioner. I acknowledge and consent to receive the Services by the Practitioner via telemedicine. I understand that the telemedicine visit will involve communicating with the Practitioner through live audiovisual communication technology and the disclosure of certain medical information by electronic transmission. I acknowledge that I have been given the opportunity to request an in-person assessment or other available alternative prior to the telemedicine visit and am voluntarily participating in the telemedicine visit.  I understand that I have the right to withhold or withdraw my consent to the use of telemedicine in the course of my care at any time, without affecting my right to future care or treatment, and that the Practitioner or I may terminate the telemedicine visit at any time. I understand that I have the right to inspect all information obtained and/or recorded in the course of the telemedicine visit and may receive copies of available information for a reasonable fee.  I understand that some of the potential risks of receiving the Services via telemedicine include:  Delay or interruption in medical evaluation due to technological equipment failure or disruption; Information transmitted may not be sufficient (e.g. poor resolution of images) to allow for appropriate medical decision making by the  Practitioner; and/or  In rare instances, security protocols could fail, causing a breach of personal health information.  Furthermore, I acknowledge that it is my responsibility to provide information about my medical history, conditions and care that is complete and accurate to the best of my ability. I acknowledge that Practitioner's advice, recommendations, and/or decision may be based on factors not within their control, such as incomplete or inaccurate data provided by me or distortions of diagnostic images or specimens that may result from electronic transmissions. I understand that the practice of medicine is not an exact science and that Practitioner makes no warranties or guarantees regarding treatment outcomes. I acknowledge that a copy of this consent can be made available to me via my patient portal [facility] MyChart), or I can request a printed copy by calling the office of [facility].    I understand that my insurance will be billed for this visit.   I have read or had this consent read to me. I understand the contents of this consent, which adequately explains the benefits and risks of the Services being provided via telemedicine.  I have been provided ample opportunity to ask questions regarding this consent and the Services and have had my questions answered to my satisfaction. I give my informed consent for the services to be provided through the use of telemedicine in my medical care

## 2023-04-21 ENCOUNTER — Other Ambulatory Visit: Payer: Self-pay

## 2023-04-21 ENCOUNTER — Other Ambulatory Visit (HOSPITAL_COMMUNITY): Payer: Self-pay

## 2023-04-24 ENCOUNTER — Other Ambulatory Visit: Payer: Self-pay

## 2023-04-24 ENCOUNTER — Other Ambulatory Visit: Payer: Self-pay | Admitting: Cardiology

## 2023-04-24 MED ORDER — METOPROLOL SUCCINATE ER 50 MG PO TB24
50.0000 mg | ORAL_TABLET | Freq: Every day | ORAL | 2 refills | Status: DC
  Filled 2023-04-24: qty 90, 90d supply, fill #0
  Filled 2023-06-14 – 2023-07-21 (×2): qty 90, 90d supply, fill #1
  Filled 2023-11-18: qty 90, 90d supply, fill #2

## 2023-04-30 DIAGNOSIS — M9902 Segmental and somatic dysfunction of thoracic region: Secondary | ICD-10-CM | POA: Diagnosis not present

## 2023-04-30 DIAGNOSIS — M9904 Segmental and somatic dysfunction of sacral region: Secondary | ICD-10-CM | POA: Diagnosis not present

## 2023-04-30 DIAGNOSIS — M7912 Myalgia of auxiliary muscles, head and neck: Secondary | ICD-10-CM | POA: Diagnosis not present

## 2023-04-30 DIAGNOSIS — M9905 Segmental and somatic dysfunction of pelvic region: Secondary | ICD-10-CM | POA: Diagnosis not present

## 2023-04-30 DIAGNOSIS — M9906 Segmental and somatic dysfunction of lower extremity: Secondary | ICD-10-CM | POA: Diagnosis not present

## 2023-04-30 DIAGNOSIS — M79672 Pain in left foot: Secondary | ICD-10-CM | POA: Diagnosis not present

## 2023-04-30 DIAGNOSIS — M9903 Segmental and somatic dysfunction of lumbar region: Secondary | ICD-10-CM | POA: Diagnosis not present

## 2023-04-30 DIAGNOSIS — M9901 Segmental and somatic dysfunction of cervical region: Secondary | ICD-10-CM | POA: Diagnosis not present

## 2023-04-30 DIAGNOSIS — M5387 Other specified dorsopathies, lumbosacral region: Secondary | ICD-10-CM | POA: Diagnosis not present

## 2023-05-01 NOTE — Progress Notes (Signed)
RN spoke with patient to assess any needs or questions prior to upcoming CT Simulation for his brachytherapy scheduled on 07/06/23.  RN provided education on side effects and restrictions with brachytherapy.  All questions answered.  No additional needs at this time.   Plan of care in progress.

## 2023-05-04 ENCOUNTER — Other Ambulatory Visit: Payer: Self-pay

## 2023-05-04 DIAGNOSIS — M9901 Segmental and somatic dysfunction of cervical region: Secondary | ICD-10-CM | POA: Diagnosis not present

## 2023-05-04 DIAGNOSIS — M5387 Other specified dorsopathies, lumbosacral region: Secondary | ICD-10-CM | POA: Diagnosis not present

## 2023-05-04 DIAGNOSIS — M79672 Pain in left foot: Secondary | ICD-10-CM | POA: Diagnosis not present

## 2023-05-04 DIAGNOSIS — M7912 Myalgia of auxiliary muscles, head and neck: Secondary | ICD-10-CM | POA: Diagnosis not present

## 2023-05-04 DIAGNOSIS — M9905 Segmental and somatic dysfunction of pelvic region: Secondary | ICD-10-CM | POA: Diagnosis not present

## 2023-05-04 DIAGNOSIS — M9904 Segmental and somatic dysfunction of sacral region: Secondary | ICD-10-CM | POA: Diagnosis not present

## 2023-05-04 DIAGNOSIS — M9906 Segmental and somatic dysfunction of lower extremity: Secondary | ICD-10-CM | POA: Diagnosis not present

## 2023-05-04 DIAGNOSIS — M9902 Segmental and somatic dysfunction of thoracic region: Secondary | ICD-10-CM | POA: Diagnosis not present

## 2023-05-04 DIAGNOSIS — M9903 Segmental and somatic dysfunction of lumbar region: Secondary | ICD-10-CM | POA: Diagnosis not present

## 2023-05-07 DIAGNOSIS — M9905 Segmental and somatic dysfunction of pelvic region: Secondary | ICD-10-CM | POA: Diagnosis not present

## 2023-05-07 DIAGNOSIS — M9902 Segmental and somatic dysfunction of thoracic region: Secondary | ICD-10-CM | POA: Diagnosis not present

## 2023-05-07 DIAGNOSIS — M9906 Segmental and somatic dysfunction of lower extremity: Secondary | ICD-10-CM | POA: Diagnosis not present

## 2023-05-07 DIAGNOSIS — M9901 Segmental and somatic dysfunction of cervical region: Secondary | ICD-10-CM | POA: Diagnosis not present

## 2023-05-07 DIAGNOSIS — M9903 Segmental and somatic dysfunction of lumbar region: Secondary | ICD-10-CM | POA: Diagnosis not present

## 2023-05-07 DIAGNOSIS — M9904 Segmental and somatic dysfunction of sacral region: Secondary | ICD-10-CM | POA: Diagnosis not present

## 2023-05-07 DIAGNOSIS — M79672 Pain in left foot: Secondary | ICD-10-CM | POA: Diagnosis not present

## 2023-05-07 DIAGNOSIS — M5387 Other specified dorsopathies, lumbosacral region: Secondary | ICD-10-CM | POA: Diagnosis not present

## 2023-05-07 DIAGNOSIS — M7912 Myalgia of auxiliary muscles, head and neck: Secondary | ICD-10-CM | POA: Diagnosis not present

## 2023-05-11 DIAGNOSIS — M9901 Segmental and somatic dysfunction of cervical region: Secondary | ICD-10-CM | POA: Diagnosis not present

## 2023-05-11 DIAGNOSIS — M79672 Pain in left foot: Secondary | ICD-10-CM | POA: Diagnosis not present

## 2023-05-11 DIAGNOSIS — M9904 Segmental and somatic dysfunction of sacral region: Secondary | ICD-10-CM | POA: Diagnosis not present

## 2023-05-11 DIAGNOSIS — M9905 Segmental and somatic dysfunction of pelvic region: Secondary | ICD-10-CM | POA: Diagnosis not present

## 2023-05-11 DIAGNOSIS — M9906 Segmental and somatic dysfunction of lower extremity: Secondary | ICD-10-CM | POA: Diagnosis not present

## 2023-05-11 DIAGNOSIS — M9902 Segmental and somatic dysfunction of thoracic region: Secondary | ICD-10-CM | POA: Diagnosis not present

## 2023-05-11 DIAGNOSIS — M7912 Myalgia of auxiliary muscles, head and neck: Secondary | ICD-10-CM | POA: Diagnosis not present

## 2023-05-11 DIAGNOSIS — M9903 Segmental and somatic dysfunction of lumbar region: Secondary | ICD-10-CM | POA: Diagnosis not present

## 2023-05-11 DIAGNOSIS — M5387 Other specified dorsopathies, lumbosacral region: Secondary | ICD-10-CM | POA: Diagnosis not present

## 2023-05-12 ENCOUNTER — Telehealth: Payer: Self-pay | Admitting: *Deleted

## 2023-05-12 NOTE — Telephone Encounter (Signed)
CALLED PATIENT TO REMIND OF PRE-SEED APPTS. FOR 05-14-23, SPOKE WITH PATIENT AND HE IS AWARE OF THESE APPTS.

## 2023-05-13 ENCOUNTER — Encounter: Payer: Self-pay | Admitting: Cardiology

## 2023-05-13 DIAGNOSIS — M9906 Segmental and somatic dysfunction of lower extremity: Secondary | ICD-10-CM | POA: Diagnosis not present

## 2023-05-13 DIAGNOSIS — M79672 Pain in left foot: Secondary | ICD-10-CM | POA: Diagnosis not present

## 2023-05-13 DIAGNOSIS — M7912 Myalgia of auxiliary muscles, head and neck: Secondary | ICD-10-CM | POA: Diagnosis not present

## 2023-05-13 DIAGNOSIS — M9904 Segmental and somatic dysfunction of sacral region: Secondary | ICD-10-CM | POA: Diagnosis not present

## 2023-05-13 DIAGNOSIS — M9903 Segmental and somatic dysfunction of lumbar region: Secondary | ICD-10-CM | POA: Diagnosis not present

## 2023-05-13 DIAGNOSIS — M9901 Segmental and somatic dysfunction of cervical region: Secondary | ICD-10-CM | POA: Diagnosis not present

## 2023-05-13 DIAGNOSIS — M9902 Segmental and somatic dysfunction of thoracic region: Secondary | ICD-10-CM | POA: Diagnosis not present

## 2023-05-13 DIAGNOSIS — M5387 Other specified dorsopathies, lumbosacral region: Secondary | ICD-10-CM | POA: Diagnosis not present

## 2023-05-13 DIAGNOSIS — M9905 Segmental and somatic dysfunction of pelvic region: Secondary | ICD-10-CM | POA: Diagnosis not present

## 2023-05-14 ENCOUNTER — Ambulatory Visit
Admission: RE | Admit: 2023-05-14 | Discharge: 2023-05-14 | Disposition: A | Discharge status: Home / Self Care | Payer: PPO | Source: Ambulatory Visit | Attending: Urology | Admitting: Urology

## 2023-05-14 ENCOUNTER — Ambulatory Visit
Admission: RE | Admit: 2023-05-14 | Discharge: 2023-05-14 | Disposition: A | Discharge status: Home / Self Care | Payer: PPO | Source: Ambulatory Visit | Attending: Radiation Oncology | Admitting: Radiation Oncology

## 2023-05-14 ENCOUNTER — Encounter: Payer: Self-pay | Admitting: Urology

## 2023-05-14 VITALS — Resp 19 | Ht 69.0 in | Wt 167.0 lb

## 2023-05-14 DIAGNOSIS — C61 Malignant neoplasm of prostate: Secondary | ICD-10-CM | POA: Insufficient documentation

## 2023-05-14 DIAGNOSIS — Z191 Hormone sensitive malignancy status: Secondary | ICD-10-CM | POA: Diagnosis not present
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
## 2023-05-14 NOTE — Progress Notes (Signed)
  Radiation Oncology         [phone] ________________________________  Name: Arthur Barrett MRN: [id number]  Date: 05/14/2023  DOB: 1956-04-29  SIMULATION AND TREATMENT PLANNING NOTE PUBIC ARCH STUDY  [id number], Jonny Ruiz, MD  Jannifer Hick, MD  DIAGNOSIS: 67 y.o. gentleman with Stage T2a adenocarcinoma of the prostate with Gleason score of 3+4, and PSA of 2.35.   Oncology History  Malignant neoplasm of prostate (HCC)  02/18/2023 Cancer Staging   Staging form: Prostate, AJCC 8th Edition - Clinical stage from 02/18/2023: Stage IIB (cT2a, cN0, cM0, PSA: 2.4, Grade Group: 2) - Signed by [name], [name] on 03/19/2023 Histopathologic type: Adenocarcinoma, NOS Stage prefix: Initial diagnosis Prostate specific antigen (PSA) range: Less than 10 Gleason primary pattern: 3 Gleason secondary pattern: 4 Gleason score: 7 Histologic grading system: 5 grade system Number of biopsy cores examined: 16 Number of biopsy cores positive: 2 Location of positive needle core biopsies: One side   03/19/2023 Initial Diagnosis   Malignant neoplasm of prostate (HCC)       ICD-10-CM   1. Malignant neoplasm of prostate (HCC)  C61       COMPLEX SIMULATION:  The patient presented today for evaluation for possible prostate seed implant. He was brought to the radiation planning suite and placed supine on the CT couch. A 3-dimensional image study set was obtained in upload to the planning computer. There, on each axial slice, I contoured the prostate gland. Then, using three-dimensional radiation planning tools I reconstructed the prostate in view of the structures from the transperineal needle pathway to assess for possible pubic arch interference. In doing so, I did not appreciate any pubic arch interference. Also, the patient's prostate volume was estimated based on the drawn structure. The volume was 29 cc.  Given the pubic arch appearance and prostate volume, patient remains a good candidate to proceed  with prostate seed implant. Today, he freely provided informed written consent to proceed.    PLAN: The patient will undergo prostate seed implant.   ________________________________  Artist Pais. Kathrynn Running, M.D.

## 2023-05-14 NOTE — Progress Notes (Signed)
Pre-seed nursing interview for a diagnosis of Stage T2a adenocarcinoma of the prostate with Gleason score of 3+4, and PSA of 2.35.  Patient identity verified x2.   Patient reports doing well. No issues conveyed at this time.  Meaningful use complete.  Urinary Management medication(s)- Tamsulosin Urology appointment date- 06/2023, with Dr. Cardell Peach at [facility]  Resp 19   Ht 5\' 9"  (1.753 m)   Wt 167 lb (75.8 kg)   BMI 24.66 kg/m   This concludes the interaction.  Ruel Favors, LPN

## 2023-05-18 ENCOUNTER — Other Ambulatory Visit: Payer: Self-pay

## 2023-05-18 ENCOUNTER — Other Ambulatory Visit (HOSPITAL_COMMUNITY): Payer: Self-pay

## 2023-05-26 DIAGNOSIS — M5387 Other specified dorsopathies, lumbosacral region: Secondary | ICD-10-CM | POA: Diagnosis not present

## 2023-05-26 DIAGNOSIS — M7912 Myalgia of auxiliary muscles, head and neck: Secondary | ICD-10-CM | POA: Diagnosis not present

## 2023-05-26 DIAGNOSIS — M9906 Segmental and somatic dysfunction of lower extremity: Secondary | ICD-10-CM | POA: Diagnosis not present

## 2023-05-26 DIAGNOSIS — M9902 Segmental and somatic dysfunction of thoracic region: Secondary | ICD-10-CM | POA: Diagnosis not present

## 2023-05-26 DIAGNOSIS — M9904 Segmental and somatic dysfunction of sacral region: Secondary | ICD-10-CM | POA: Diagnosis not present

## 2023-05-26 DIAGNOSIS — M9901 Segmental and somatic dysfunction of cervical region: Secondary | ICD-10-CM | POA: Diagnosis not present

## 2023-05-26 DIAGNOSIS — M79672 Pain in left foot: Secondary | ICD-10-CM | POA: Diagnosis not present

## 2023-05-26 DIAGNOSIS — M9903 Segmental and somatic dysfunction of lumbar region: Secondary | ICD-10-CM | POA: Diagnosis not present

## 2023-05-26 DIAGNOSIS — M9905 Segmental and somatic dysfunction of pelvic region: Secondary | ICD-10-CM | POA: Diagnosis not present

## 2023-05-31 ENCOUNTER — Other Ambulatory Visit: Payer: Self-pay

## 2023-06-01 ENCOUNTER — Other Ambulatory Visit: Payer: Self-pay

## 2023-06-03 DIAGNOSIS — M5387 Other specified dorsopathies, lumbosacral region: Secondary | ICD-10-CM | POA: Diagnosis not present

## 2023-06-03 DIAGNOSIS — M9901 Segmental and somatic dysfunction of cervical region: Secondary | ICD-10-CM | POA: Diagnosis not present

## 2023-06-03 DIAGNOSIS — M7912 Myalgia of auxiliary muscles, head and neck: Secondary | ICD-10-CM | POA: Diagnosis not present

## 2023-06-03 DIAGNOSIS — M9906 Segmental and somatic dysfunction of lower extremity: Secondary | ICD-10-CM | POA: Diagnosis not present

## 2023-06-03 DIAGNOSIS — M9903 Segmental and somatic dysfunction of lumbar region: Secondary | ICD-10-CM | POA: Diagnosis not present

## 2023-06-03 DIAGNOSIS — M9905 Segmental and somatic dysfunction of pelvic region: Secondary | ICD-10-CM | POA: Diagnosis not present

## 2023-06-03 DIAGNOSIS — M9902 Segmental and somatic dysfunction of thoracic region: Secondary | ICD-10-CM | POA: Diagnosis not present

## 2023-06-03 DIAGNOSIS — M9904 Segmental and somatic dysfunction of sacral region: Secondary | ICD-10-CM | POA: Diagnosis not present

## 2023-06-03 DIAGNOSIS — M79672 Pain in left foot: Secondary | ICD-10-CM | POA: Diagnosis not present

## 2023-06-14 ENCOUNTER — Other Ambulatory Visit (HOSPITAL_COMMUNITY): Payer: Self-pay

## 2023-06-16 ENCOUNTER — Other Ambulatory Visit: Payer: Self-pay

## 2023-06-17 DIAGNOSIS — M9904 Segmental and somatic dysfunction of sacral region: Secondary | ICD-10-CM | POA: Diagnosis not present

## 2023-06-17 DIAGNOSIS — M9901 Segmental and somatic dysfunction of cervical region: Secondary | ICD-10-CM | POA: Diagnosis not present

## 2023-06-17 DIAGNOSIS — M5387 Other specified dorsopathies, lumbosacral region: Secondary | ICD-10-CM | POA: Diagnosis not present

## 2023-06-17 DIAGNOSIS — M7912 Myalgia of auxiliary muscles, head and neck: Secondary | ICD-10-CM | POA: Diagnosis not present

## 2023-06-17 DIAGNOSIS — M9902 Segmental and somatic dysfunction of thoracic region: Secondary | ICD-10-CM | POA: Diagnosis not present

## 2023-06-17 DIAGNOSIS — M9906 Segmental and somatic dysfunction of lower extremity: Secondary | ICD-10-CM | POA: Diagnosis not present

## 2023-06-17 DIAGNOSIS — M9905 Segmental and somatic dysfunction of pelvic region: Secondary | ICD-10-CM | POA: Diagnosis not present

## 2023-06-17 DIAGNOSIS — M9903 Segmental and somatic dysfunction of lumbar region: Secondary | ICD-10-CM | POA: Diagnosis not present

## 2023-06-17 DIAGNOSIS — M79672 Pain in left foot: Secondary | ICD-10-CM | POA: Diagnosis not present

## 2023-06-20 ENCOUNTER — Other Ambulatory Visit (HOSPITAL_COMMUNITY): Payer: Self-pay

## 2023-06-23 ENCOUNTER — Other Ambulatory Visit: Payer: Self-pay

## 2023-06-23 ENCOUNTER — Ambulatory Visit: Payer: PPO | Attending: Cardiology | Admitting: Nurse Practitioner

## 2023-06-23 DIAGNOSIS — Z01810 Encounter for preprocedural cardiovascular examination: Secondary | ICD-10-CM
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
## 2023-06-26 ENCOUNTER — Encounter (HOSPITAL_BASED_OUTPATIENT_CLINIC_OR_DEPARTMENT_OTHER): Payer: Self-pay | Admitting: Urology

## 2023-06-26 ENCOUNTER — Other Ambulatory Visit: Payer: Self-pay

## 2023-06-26 NOTE — Progress Notes (Signed)
Spoke w/ via phone for pre-op interview---pt Lab needs dos---- I stat              Lab results------see below COVID test -----patient states asymptomatic no test needed Arrive at -------1100 07-06-2023 NPO after MN NO Solid Food.  Clear liquids from MN until---1000 Med rec completed Medications to take morning of surgery -----mybetriq Diabetic medication -----n/a Patient instructed no nail polish to be worn day of surgery Patient instructed to bring photo id and insurance card day of surgery Patient aware to have Driver (ride ) / caregiver    for 24 hours after surgery  daughter heather jenkins or Anaisha Mago welker Patient Special Instructions -----fleets enema am of surgery Pre-Op special Instructions -----none Patient verbalized understanding of instructions that were given at this phone interview. Patient denies shortness of breath, chest pain, fever, cough at this phone interview.  Cardiac clearance/note to hold eliquis 3 days before surgery emily monge np dated 06-23-2023 on chart/epic, pt vocalized last dose eliquis to be 07-02-2023  [facility] cardiology dr Kirtland Bouchard todd 09-19-2022 epic EKG 08-04-2022 chart/epic Stress test 12-24-2021 epic Echo 08-14-2022 epic

## 2023-06-28 ENCOUNTER — Other Ambulatory Visit (HOSPITAL_COMMUNITY): Payer: Self-pay

## 2023-06-29 DIAGNOSIS — M25532 Pain in left wrist: Secondary | ICD-10-CM | POA: Diagnosis not present

## 2023-06-29 DIAGNOSIS — S63592A Other specified sprain of left wrist, initial encounter: Secondary | ICD-10-CM | POA: Diagnosis not present

## 2023-06-30 DIAGNOSIS — C61 Malignant neoplasm of prostate: Secondary | ICD-10-CM | POA: Diagnosis not present

## 2023-07-01 DIAGNOSIS — M9903 Segmental and somatic dysfunction of lumbar region: Secondary | ICD-10-CM | POA: Diagnosis not present

## 2023-07-01 DIAGNOSIS — M79672 Pain in left foot: Secondary | ICD-10-CM | POA: Diagnosis not present

## 2023-07-01 DIAGNOSIS — M9904 Segmental and somatic dysfunction of sacral region: Secondary | ICD-10-CM | POA: Diagnosis not present

## 2023-07-01 DIAGNOSIS — M5387 Other specified dorsopathies, lumbosacral region: Secondary | ICD-10-CM | POA: Diagnosis not present

## 2023-07-01 DIAGNOSIS — M9906 Segmental and somatic dysfunction of lower extremity: Secondary | ICD-10-CM | POA: Diagnosis not present

## 2023-07-01 DIAGNOSIS — M7912 Myalgia of auxiliary muscles, head and neck: Secondary | ICD-10-CM | POA: Diagnosis not present

## 2023-07-01 DIAGNOSIS — M9901 Segmental and somatic dysfunction of cervical region: Secondary | ICD-10-CM | POA: Diagnosis not present

## 2023-07-01 DIAGNOSIS — M9902 Segmental and somatic dysfunction of thoracic region: Secondary | ICD-10-CM | POA: Diagnosis not present

## 2023-07-01 DIAGNOSIS — M9905 Segmental and somatic dysfunction of pelvic region: Secondary | ICD-10-CM | POA: Diagnosis not present

## 2023-07-03 ENCOUNTER — Telehealth: Payer: Self-pay | Admitting: *Deleted

## 2023-07-03 NOTE — Telephone Encounter (Signed)
Called patient to remind of procedure for 07-06-23, lvm for a return call

## 2023-07-06 ENCOUNTER — Other Ambulatory Visit (HOSPITAL_COMMUNITY): Payer: Self-pay

## 2023-07-06 ENCOUNTER — Ambulatory Visit (HOSPITAL_BASED_OUTPATIENT_CLINIC_OR_DEPARTMENT_OTHER): Payer: PPO | Admitting: Anesthesiology

## 2023-07-06 ENCOUNTER — Ambulatory Visit (HOSPITAL_BASED_OUTPATIENT_CLINIC_OR_DEPARTMENT_OTHER)
Admission: RE | Admit: 2023-07-06 | Discharge: 2023-07-06 | Disposition: A | Discharge status: Home / Self Care | Payer: PPO | Attending: Urology | Admitting: Urology

## 2023-07-06 ENCOUNTER — Other Ambulatory Visit: Payer: Self-pay

## 2023-07-06 ENCOUNTER — Ambulatory Visit (HOSPITAL_COMMUNITY): Payer: PPO

## 2023-07-06 ENCOUNTER — Encounter (HOSPITAL_BASED_OUTPATIENT_CLINIC_OR_DEPARTMENT_OTHER): Payer: Self-pay | Admitting: Urology

## 2023-07-06 ENCOUNTER — Encounter (HOSPITAL_BASED_OUTPATIENT_CLINIC_OR_DEPARTMENT_OTHER)
Admission: RE | Disposition: A | Discharge status: Home / Self Care | Payer: Self-pay | Source: Home / Self Care | Attending: Urology

## 2023-07-06 ENCOUNTER — Other Ambulatory Visit: Payer: Self-pay | Admitting: Cardiology

## 2023-07-06 DIAGNOSIS — I4891 Unspecified atrial fibrillation: Secondary | ICD-10-CM | POA: Insufficient documentation

## 2023-07-06 DIAGNOSIS — Z191 Hormone sensitive malignancy status: Secondary | ICD-10-CM | POA: Diagnosis not present

## 2023-07-06 DIAGNOSIS — I340 Nonrheumatic mitral (valve) insufficiency: Secondary | ICD-10-CM | POA: Insufficient documentation

## 2023-07-06 DIAGNOSIS — Z79899 Other long term (current) drug therapy: Secondary | ICD-10-CM | POA: Diagnosis not present

## 2023-07-06 DIAGNOSIS — Z7901 Long term (current) use of anticoagulants: Secondary | ICD-10-CM | POA: Insufficient documentation

## 2023-07-06 DIAGNOSIS — N289 Disorder of kidney and ureter, unspecified: Secondary | ICD-10-CM | POA: Insufficient documentation

## 2023-07-06 DIAGNOSIS — Z87442 Personal history of urinary calculi: Secondary | ICD-10-CM | POA: Insufficient documentation

## 2023-07-06 DIAGNOSIS — E0580 Other thyrotoxicosis without thyrotoxic crisis or storm: Secondary | ICD-10-CM | POA: Diagnosis not present

## 2023-07-06 DIAGNOSIS — E875 Hyperkalemia: Secondary | ICD-10-CM | POA: Diagnosis not present

## 2023-07-06 DIAGNOSIS — C61 Malignant neoplasm of prostate: Secondary | ICD-10-CM

## 2023-07-06 DIAGNOSIS — E785 Hyperlipidemia, unspecified: Secondary | ICD-10-CM

## 2023-07-06 DIAGNOSIS — I4892 Unspecified atrial flutter: Secondary | ICD-10-CM | POA: Diagnosis not present

## 2023-07-06 DIAGNOSIS — Z01818 Encounter for other preprocedural examination: Secondary | ICD-10-CM

## 2023-07-06 HISTORY — PX: RADIOACTIVE SEED IMPLANT: SHX5150

## 2023-07-06 HISTORY — DX: Malignant neoplasm of prostate: C61

## 2023-07-06 HISTORY — PX: SPACE OAR INSTILLATION: SHX6769

## 2023-07-06 HISTORY — PX: CYSTOSCOPY: SHX5120

## 2023-07-06 HISTORY — DX: Personal history of other diseases of the circulatory system: Z86.79

## 2023-07-06 LAB — POCT I-STAT, CHEM 8
BUN: 9 mg/dL (ref 8–23)
Calcium, Ion: 1.22 mmol/L (ref 1.15–1.40)
Chloride: 102 mmol/L (ref 98–111)
Creatinine, Ser: 0.9 mg/dL (ref 0.61–1.24)
Glucose, Bld: 92 mg/dL (ref 70–99)
HCT: 44 % (ref 39.0–52.0)
Hemoglobin: 15 g/dL (ref 13.0–17.0)
Potassium: 3.6 mmol/L (ref 3.5–5.1)
Sodium: 142 mmol/L (ref 135–145)
TCO2: 24 mmol/L (ref 22–32)

## 2023-07-06 SURGERY — INSERTION, RADIATION SOURCE, PROSTATE
Anesthesia: General | Site: Prostate

## 2023-07-06 MED ORDER — ACETAMINOPHEN 500 MG PO TABS
1000.0000 mg | ORAL_TABLET | Freq: Once | ORAL | Status: AC
  Administered 2023-07-06: 1000 mg via ORAL

## 2023-07-06 MED ORDER — STERILE WATER FOR IRRIGATION IR SOLN
Status: DC | PRN
  Administered 2023-07-06: 3 mL

## 2023-07-06 MED ORDER — CIPROFLOXACIN IN D5W 400 MG/200ML IV SOLN
400.0000 mg | INTRAVENOUS | Status: AC
  Administered 2023-07-06: 400 mg via INTRAVENOUS

## 2023-07-06 MED ORDER — ONDANSETRON HCL 4 MG/2ML IJ SOLN
INTRAMUSCULAR | Status: AC
  Filled 2023-07-06: qty 2

## 2023-07-06 MED ORDER — OXYCODONE-ACETAMINOPHEN 5-325 MG PO TABS
1.0000 | ORAL_TABLET | ORAL | 0 refills | Status: DC | PRN
Start: 2023-07-06 — End: 2023-08-03
  Filled 2023-07-06 (×2): qty 18, 3d supply, fill #0

## 2023-07-06 MED ORDER — DEXAMETHASONE SODIUM PHOSPHATE 4 MG/ML IJ SOLN
INTRAMUSCULAR | Status: DC | PRN
  Administered 2023-07-06: 4 mg via INTRAVENOUS

## 2023-07-06 MED ORDER — PROPOFOL 10 MG/ML IV BOLUS
INTRAVENOUS | Status: AC
  Filled 2023-07-06: qty 20

## 2023-07-06 MED ORDER — ROCURONIUM BROMIDE 10 MG/ML (PF) SYRINGE
PREFILLED_SYRINGE | INTRAVENOUS | Status: AC
  Filled 2023-07-06: qty 10

## 2023-07-06 MED ORDER — GLYCOPYRROLATE PF 0.2 MG/ML IJ SOSY
PREFILLED_SYRINGE | INTRAMUSCULAR | Status: AC
  Filled 2023-07-06: qty 1

## 2023-07-06 MED ORDER — LACTATED RINGERS IV SOLN: INTRAVENOUS | Status: DC

## 2023-07-06 MED ORDER — IOHEXOL 300 MG/ML  SOLN
INTRAMUSCULAR | Status: DC | PRN
  Administered 2023-07-06: 7 mL

## 2023-07-06 MED ORDER — ROCURONIUM BROMIDE 100 MG/10ML IV SOLN
INTRAVENOUS | Status: DC | PRN
  Administered 2023-07-06: 50 mg via INTRAVENOUS
  Administered 2023-07-06: 10 mg via INTRAVENOUS

## 2023-07-06 MED ORDER — PROPOFOL 10 MG/ML IV BOLUS
INTRAVENOUS | Status: DC | PRN
  Administered 2023-07-06: 20 mg via INTRAVENOUS
  Administered 2023-07-06: 130 mg via INTRAVENOUS

## 2023-07-06 MED ORDER — ONDANSETRON HCL 4 MG/2ML IJ SOLN
INTRAMUSCULAR | Status: DC | PRN
  Administered 2023-07-06: 4 mg via INTRAVENOUS

## 2023-07-06 MED ORDER — ONDANSETRON HCL 4 MG/2ML IJ SOLN: 4.0000 mg | Freq: Once | INTRAMUSCULAR | Status: DC | PRN

## 2023-07-06 MED ORDER — LIDOCAINE HCL (PF) 2 % IJ SOLN
INTRAMUSCULAR | Status: AC
  Filled 2023-07-06: qty 5

## 2023-07-06 MED ORDER — EPHEDRINE 5 MG/ML INJ
INTRAVENOUS | Status: AC
  Filled 2023-07-06: qty 5

## 2023-07-06 MED ORDER — FLEET ENEMA 7-19 GM/118ML RE ENEM: 1.0000 | ENEMA | Freq: Once | RECTAL | Status: DC

## 2023-07-06 MED ORDER — GLYCOPYRROLATE 0.2 MG/ML IJ SOLN
INTRAMUSCULAR | Status: DC | PRN
  Administered 2023-07-06: .2 mg via INTRAVENOUS

## 2023-07-06 MED ORDER — OXYCODONE HCL 5 MG/5ML PO SOLN: 5.0000 mg | Freq: Once | ORAL | Status: DC | PRN

## 2023-07-06 MED ORDER — ACETAMINOPHEN 500 MG PO TABS
ORAL_TABLET | ORAL | Status: AC
  Filled 2023-07-06: qty 2

## 2023-07-06 MED ORDER — EPHEDRINE SULFATE (PRESSORS) 50 MG/ML IJ SOLN
INTRAMUSCULAR | Status: DC | PRN
  Administered 2023-07-06: 10 mg via INTRAVENOUS
  Administered 2023-07-06 (×2): 5 mg via INTRAVENOUS

## 2023-07-06 MED ORDER — DOCUSATE SODIUM 100 MG PO CAPS
100.0000 mg | ORAL_CAPSULE | Freq: Every day | ORAL | 0 refills | Status: AC | PRN
  Filled 2023-07-06 (×2): qty 30, 30d supply, fill #0

## 2023-07-06 MED ORDER — FENTANYL CITRATE (PF) 100 MCG/2ML IJ SOLN
INTRAMUSCULAR | Status: DC | PRN
  Administered 2023-07-06: 50 ug via INTRAVENOUS
  Administered 2023-07-06: 25 ug via INTRAVENOUS

## 2023-07-06 MED ORDER — SUGAMMADEX SODIUM 200 MG/2ML IV SOLN
INTRAVENOUS | Status: DC | PRN
Start: 2023-07-06 — End: 2023-07-06
  Administered 2023-07-06: 200 mg via INTRAVENOUS

## 2023-07-06 MED ORDER — FENTANYL CITRATE (PF) 100 MCG/2ML IJ SOLN: 25.0000 ug | INTRAMUSCULAR | Status: DC | PRN

## 2023-07-06 MED ORDER — CIPROFLOXACIN IN D5W 400 MG/200ML IV SOLN
INTRAVENOUS | Status: AC
  Filled 2023-07-06: qty 200

## 2023-07-06 MED ORDER — LIDOCAINE HCL (CARDIAC) PF 100 MG/5ML IV SOSY
PREFILLED_SYRINGE | INTRAVENOUS | Status: DC | PRN
  Administered 2023-07-06: 60 mg via INTRAVENOUS

## 2023-07-06 MED ORDER — KETOROLAC TROMETHAMINE 30 MG/ML IJ SOLN
INTRAMUSCULAR | Status: AC
  Filled 2023-07-06: qty 1

## 2023-07-06 MED ORDER — DEXAMETHASONE SODIUM PHOSPHATE 10 MG/ML IJ SOLN
INTRAMUSCULAR | Status: AC
  Filled 2023-07-06: qty 1

## 2023-07-06 MED ORDER — KETOROLAC TROMETHAMINE 30 MG/ML IJ SOLN
INTRAMUSCULAR | Status: DC | PRN
  Administered 2023-07-06: 15 mg via INTRAVENOUS

## 2023-07-06 MED ORDER — FENTANYL CITRATE (PF) 100 MCG/2ML IJ SOLN
INTRAMUSCULAR | Status: AC
  Filled 2023-07-06: qty 2

## 2023-07-06 MED ORDER — OXYCODONE HCL 5 MG PO TABS: 5.0000 mg | ORAL_TABLET | Freq: Once | ORAL | Status: DC | PRN

## 2023-07-06 MED ORDER — SODIUM CHLORIDE (PF) 0.9 % IJ SOLN
INTRAMUSCULAR | Status: DC | PRN
  Administered 2023-07-06: 10 mL

## 2023-07-06 SURGICAL SUPPLY — 45 items
BAG DRN RND TRDRP ANRFLXCHMBR (UROLOGICAL SUPPLIES) ×2
BAG URINE DRAIN 2000ML AR STRL (UROLOGICAL SUPPLIES) ×2 IMPLANT
BLADE CLIPPER SENSICLIP SURGIC (BLADE) ×2 IMPLANT
Bard QuickLink Cartridges with BrachySource I-125 IMPLANT
CATH FOLEY 2WAY SLVR 5CC 16FR (CATHETERS) ×2 IMPLANT
CATH ROBINSON RED A/P 16FR (CATHETERS) IMPLANT
CATH ROBINSON RED A/P 20FR (CATHETERS) ×2 IMPLANT
CLOTH BEACON ORANGE TIMEOUT ST (SAFETY) ×2 IMPLANT
CNTNR URN SCR LID CUP LEK RST (MISCELLANEOUS) ×2 IMPLANT
CONT SPEC 4OZ STRL OR WHT (MISCELLANEOUS) ×2
COVER BACK TABLE 60X90IN (DRAPES) ×2 IMPLANT
COVER MAYO STAND STRL (DRAPES) ×2 IMPLANT
DRSG TEGADERM 4X4.75 (GAUZE/BANDAGES/DRESSINGS) ×2 IMPLANT
DRSG TEGADERM 8X12 (GAUZE/BANDAGES/DRESSINGS) ×2 IMPLANT
GAUZE SPONGE 4X4 12PLY STRL (GAUZE/BANDAGES/DRESSINGS) IMPLANT
GEL ULTRASOUND 20GR AQUASONIC (MISCELLANEOUS) ×4 IMPLANT
GLOVE BIO SURGEON STRL SZ 6.5 (GLOVE) IMPLANT
GLOVE BIO SURGEON STRL SZ7 (GLOVE) ×2 IMPLANT
GLOVE BIO SURGEON STRL SZ7.5 (GLOVE) ×2 IMPLANT
GLOVE BIO SURGEON STRL SZ8 (GLOVE) IMPLANT
GLOVE BIOGEL PI IND STRL 6.5 (GLOVE) IMPLANT
GLOVE SURG ORTHO 8.5 STRL (GLOVE) ×2 IMPLANT
GOWN STRL REUS W/TWL LRG LVL3 (GOWN DISPOSABLE) ×2 IMPLANT
GRID BRACH TEMP 18GA 2.8X3X.75 (MISCELLANEOUS) ×2 IMPLANT
HOLDER FOLEY CATH W/STRAP (MISCELLANEOUS) IMPLANT
IMPL SPACEOAR VUE SYSTEM (Spacer) ×2 IMPLANT
IMPLANT SPACEOAR VUE SYSTEM (Spacer) ×2 IMPLANT
IV NS 1000ML (IV SOLUTION) ×2
IV NS 1000ML BAXH (IV SOLUTION) ×2 IMPLANT
KIT TURNOVER CYSTO (KITS) ×2 IMPLANT
NDL BRACHY 18G 5PK (NEEDLE) ×8 IMPLANT
NDL BRACHY 18G SINGLE (NEEDLE) IMPLANT
NDL PK MORGANSTERN STABILIZ (NEEDLE) ×2 IMPLANT
NEEDLE BRACHY 18G 5PK (NEEDLE) ×8 IMPLANT
NEEDLE BRACHY 18G SINGLE (NEEDLE) IMPLANT
NEEDLE PK MORGANSTERN STABILIZ (NEEDLE) ×2 IMPLANT
PACK CYSTO (CUSTOM PROCEDURE TRAY) ×2 IMPLANT
SHEATH ULTRASOUND LF (SHEATH) IMPLANT
SHEATH ULTRASOUND LTX NONSTRL (SHEATH) IMPLANT
SLEEVE SCD COMPRESS KNEE MED (STOCKING) ×2 IMPLANT
SUT BONE WAX W31G (SUTURE) IMPLANT
SYR 10ML LL (SYRINGE) ×2 IMPLANT
TOWEL OR 17X24 6PK STRL BLUE (TOWEL DISPOSABLE) ×2 IMPLANT
UNDERPAD 30X36 HEAVY ABSORB (UNDERPADS AND DIAPERS) ×4 IMPLANT
WATER STERILE IRR 500ML POUR (IV SOLUTION) ×2 IMPLANT

## 2023-07-06 NOTE — Anesthesia Procedure Notes (Signed)
Procedure Name: Intubation Date/Time: 07/06/2023 1:16 PM  Performed by: Bishop Limbo, CRNAPre-anesthesia Checklist: Patient identified, Emergency Drugs available, Suction available and Patient being monitored Patient Re-evaluated:Patient Re-evaluated prior to induction Oxygen Delivery Method: Circle System Utilized Preoxygenation: Pre-oxygenation with 100% oxygen Induction Type: IV induction Ventilation: Mask ventilation without difficulty Laryngoscope Size: Mac and 4 Grade View: Grade I Tube type: Oral Tube size: 7.0 mm Number of attempts: 1 Airway Equipment and Method: Stylet Placement Confirmation: ETT inserted through vocal cords under direct vision, positive ETCO2 and breath sounds checked- equal and bilateral Secured at: 22 cm Tube secured with: Tape Dental Injury: Teeth and Oropharynx as per pre-operative assessment

## 2023-07-06 NOTE — Transfer of Care (Signed)
Immediate Anesthesia Transfer of Care Note  Patient: LELTON FREEDMAN  Procedure(s) Performed: RADIOACTIVE SEED IMPLANT/BRACHYTHERAPY IMPLANT (Prostate) SPACE OAR INSTILLATION (Prostate) CYSTOSCOPY FLEXIBLE (Bladder)  Patient Location: PACU  Anesthesia Type:General  Level of Consciousness: awake, alert , oriented, and patient cooperative  Airway & Oxygen Therapy: Patient Spontanous Breathing and Patient connected to nasal cannula oxygen  Post-op Assessment: Report given to RN and Post -op Vital signs reviewed and stable  Post vital signs: Reviewed and stable  Last Vitals:  Vitals Value Taken Time  BP 125/82 07/06/23 1438  Temp    Pulse 61 07/06/23 1439  Resp 17 07/06/23 1439  SpO2 100 % 07/06/23 1439  Vitals shown include unfiled device data.  Last Pain:  Vitals:   07/06/23 1048  TempSrc: Oral  PainSc: 0-No pain      Patients Stated Pain Goal: 3 (07/06/23 1048)  Complications: No notable events documented.

## 2023-07-06 NOTE — Anesthesia Preprocedure Evaluation (Addendum)
Anesthesia Evaluation  Patient identified by MRN, date of birth, ID band Patient awake    Reviewed: Allergy & Precautions, NPO status , Patient's Chart, lab work & pertinent test results  History of Anesthesia Complications Negative for: history of anesthetic complications  Airway Mallampati: III  TM Distance: >3 FB Neck ROM: Full    Dental  (+) Dental Advisory Given, Teeth Intact   Pulmonary neg pulmonary ROS   Pulmonary exam normal        Cardiovascular Normal cardiovascular exam+ dysrhythmias Atrial Fibrillation    '23 TTE - EF 60 to 65%. 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     Neuro/Psych negative neurological ROS  negative psych ROS   GI/Hepatic negative GI ROS, Neg liver ROS,,,  Endo/Other  negative endocrine ROS    Renal/GU Renal InsufficiencyRenal disease    Prostate cancer     Musculoskeletal negative musculoskeletal ROS (+)    Abdominal   Peds  Hematology negative hematology ROS (+)   Anesthesia Other Findings   Reproductive/Obstetrics                             Anesthesia Physical Anesthesia Plan  ASA: 3  Anesthesia Plan: General   Post-op Pain Management: Tylenol PO (pre-op)*   Induction: Intravenous  PONV Risk Score and Plan: 2 and Treatment may vary due to age or medical condition, Ondansetron and Dexamethasone  Airway Management Planned: Oral ETT  Additional Equipment: None  Intra-op Plan:   Post-operative Plan: Extubation in OR  Informed Consent: I have reviewed the patients History and Physical, chart, labs and discussed the procedure including the risks, benefits and alternatives for the proposed anesthesia with the patient or authorized representative who has indicated his/her understanding and acceptance.     Dental advisory given  Plan Discussed with: CRNA and  Anesthesiologist  Anesthesia Plan Comments:         Anesthesia Quick Evaluation

## 2023-07-06 NOTE — Progress Notes (Signed)
Radiation Oncology         [phone] ________________________________  Name: Arthur Barrett MRN: [id number]  Date: 07/06/2023  DOB: [birth year]       Prostate Seed Implant  XB:MWUXL, Jonny Ruiz, MD  No ref. provider found  DIAGNOSIS:  67 y.o. gentleman with Stage T2a adenocarcinoma of the prostate with Gleason score of 3+4, and PSA of 2.35.   Oncology History  Malignant neoplasm of prostate (HCC)  02/18/2023 Cancer Staging   Staging form: Prostate, AJCC 8th Edition - Clinical stage from 02/18/2023: Stage IIB (cT2a, cN0, cM0, PSA: 2.4, Grade Group: 2) - Signed by [name], [name] on 03/19/2023 Histopathologic type: Adenocarcinoma, NOS Stage prefix: Initial diagnosis Prostate specific antigen (PSA) range: Less than 10 Gleason primary pattern: 3 Gleason secondary pattern: 4 Gleason score: 7 Histologic grading system: 5 grade system Number of biopsy cores examined: 16 Number of biopsy cores positive: 2 Location of positive needle core biopsies: One side   03/19/2023 Initial Diagnosis   Malignant neoplasm of prostate (HCC)       ICD-10-CM   1. Pre-op testing  Z01.818 CBG per Guidelines for Diabetes Management for Patients Undergoing Surgery ([facility], [facility], and [facility] only)    CBG per protocol    I-Stat, Chem 8 on day of surgery per protocol      PROCEDURE: Insertion of radioactive I-125 seeds into the prostate gland.  RADIATION DOSE: 145 Gy, definitive therapy.  TECHNIQUE: ALPHONS ROZON was brought to the operating room with the urologist. He was placed in the dorsolithotomy position. He was catheterized and a rectal tube was inserted. The perineum was shaved, prepped and draped. The ultrasound probe was then introduced by me into the rectum to see the prostate gland.  TREATMENT DEVICE: I attached the needle grid to the ultrasound probe stand and anchor needles were placed.  3D PLANNING: The prostate was imaged in 3D using a sagittal sweep of the prostate probe. These images  were transferred to the planning computer. There, the prostate, urethra and rectum were defined on each axial reconstructed image. Then, the software created an optimized 3D plan and a few seed positions were adjusted. The quality of the plan was reviewed using [facility] information for the target and the following two organs at risk:  Urethra and Rectum.  Then the accepted plan was printed and handed off to the radiation therapist.  Under my supervision, the custom loading of the seeds and spacers was carried out using the quick loader.  These pre-loaded needles were then placed into the needle holder.[name]  PROSTATE VOLUME STUDY:  Using transrectal ultrasound the volume of the prostate was verified to be 36 cc.  SPECIAL TREATMENT PROCEDURE/SUPERVISION AND HANDLING: The pre-loaded needles were then delivered by the urologist under sagittal guidance. A total of 18 needles were used to deposit 71 seeds in the prostate gland. The individual seed activity was 0.381 mCi.  SpaceOAR:  Yes  COMPLEX SIMULATION: At the end of the procedure, an anterior radiograph of the pelvis was obtained to document seed positioning and count. Cystoscopy was performed by the urologist to check the urethra and bladder.  MICRODOSIMETRY: At the end of the procedure, the patient was emitting 0.11 mR/hr at 1 meter. Accordingly, he was considered safe for hospital discharge.  PLAN: The patient will return to the radiation oncology clinic for post implant CT dosimetry in three weeks.   ________________________________  [name], M.D.

## 2023-07-06 NOTE — Discharge Instructions (Addendum)
Activity:  You are encouraged to ambulate frequently (about every hour during waking hours) to help prevent blood clots from forming in your legs or lungs.    Diet: You should advance your diet as instructed by your physician.  It will be normal to have some bloating, nausea, and abdominal discomfort intermittently.  Prescriptions:  You will be provided a prescription for pain medication to take as needed.  If your pain is not severe enough to require the prescription pain medication, you may take extra strength Tylenol instead which will have less side effects.  You should also take a prescribed stool softener to avoid straining with bowel movements as the prescription pain medication may constipate you.  What to call us about: You should call the office [phone]) if you develop fever > 101 or develop persistent vomiting. Activity:  You are encouraged to ambulate frequently (about every hour during waking hours) to help prevent blood clots from forming in your legs or lungs.     Post Anesthesia Home Care Instructions  Activity: Get plenty of rest for the remainder of the day. A responsible individual must stay with you for 24 hours following the procedure.  For the next 24 hours, DO NOT: -Drive a car -Advertising copywriter -Drink alcoholic beverages -Take any medication unless instructed by your physician -Make any legal decisions or sign important papers.  Meals: Start with liquid foods such as gelatin or soup. Progress to regular foods as tolerated. Avoid greasy, spicy, heavy foods. If nausea and/or vomiting occur, drink only clear liquids until the nausea and/or vomiting subsides. Call your physician if vomiting continues.  Special Instructions/Symptoms: Your throat may feel dry or sore from the anesthesia or the breathing tube placed in your throat during surgery. If this causes discomfort, gargle with warm salt water. The discomfort should disappear within 24 hours.  PROSTATE CANCER  TREATMENT WITH RADIOACTIVE IODINE-125 SEED IMPLANT  This instruction sheet is intended to discuss implantation of Iodine-125 seeds as treatment for cancer of the prostate. It will explain in detail what you may expect from this treatment and what precautions are necessary as a result of the treatment. Iodine-125 emits a relatively low energy radiation. The radioactive seeds are surgically implanted directly into the prostate gland. Most of the radiation is contained within the prostate gland. A very small amount is present outside the body.The precautions that we ask you to take are to ensure that those around you are protected from unnecessary radiation. The principles of radiation safety that you need to understand are:  DISTANCE: The further a person is from the radioactive implant the less radiation they will be receiving. The amount of radiation received falls off quite rapidly with distance. More specific guidelines are given in the table on the last page.  TIME: The amount of radiation a person is exposed to is directly proportional to the amount of time that is spent in close proximity to the radioactive implant. Very little radiation will be received during short periods. See the table on the last page for more specific guideline.  CHILDREN UNDER AGE 53 Children should not be allowed to sit on your lap or otherwise be in very close contact for more than a few minutes for the first 6-8 weeks following the implant. You may affectionately greet (hug/kiss) a child for a short period of time, but remember, the longer you are in close proximity with that child the more radiation they are being exposed to. At a distance of 6 feet  there is no limit to the length of time you may spend together. See specific guidelines on the last page.  PREGNANT OR POSSIBLY PREGNANT WOMEN Pregnant women should avoid prolonged close physical contact with you for the first 6-8 weeks after implant. At a distance of 6 feet  there is no limit to the length of time you may spend together. Pregnant women or possibly pregnant women can safely be in close contact with you for a limited period of time. See the last page for guidelines.  FAMILY RELATIONS You may sleep in the same bed as your partner (provided she is not pregnant or under the age of 31). Sexual intercourse, using a condom, may be resumed 2 weeks after the implant. Your semen may be discolored, dark brown or black. This is normal and is the result of bleeding that may have occurred during the implant. After 3-4 weeks it will not be necessary to use a condom.  DAILY ACTIVITIES You may resume normal activities in a few days (example: work, shopping, church) without the risk of harmful radiation exposure to those around you provided you keep in mind the time and distance precautions. Objects that you touch or item that you use do not become radioactive. Linens, clothing, tableware, and dishes may be used by other persons without special precautions. Your bodily wastes (urine and stool) are not radioactive.  SPECIAL PRECAUTIONS It is possible to lose implanted Iodine-125 seed(s) through urination. Although it is possible to pass seeds indefinitely, it is most likely to occur immediately after catheter removal. To prevent this from happening the catheter that was in place during the implant procedure is removed immediately after the implant and a cystoscopy procedure is performed. The process of removing the catheter and the cystoscopy procedure should dislodge and remove any seeds that are not firmly imbedded in the prostate tissue. However, you should watch for seeds if/when you remove your catheter at home. The seeds are silver colored and the size of a grain of rice. In the unlikely event that a seed is seen after urination, simply flush the seed down the toilet. The seed should not be handled with your fingers, not even with a glove or napkin. A spoon or tweezers can  be used to pick up a seed. The Radiation Oncology department is open Monday - Friday from 8:00 am to 5:30 pm with a Radiation Oncologist on call at all times. He or she may be reached by calling [phone]. If you are to be hospitalized or if death should occur, your family should notify the Actor.  SIDE EFFECTS There are very few side effects associate with the implant procedure. Minor burning with urination, weak stream, hesitancy, intermittency, frequency, mild pain or feeling unable to pass your urine freely are common and usually stop in one to four months. If these symptoms are extremely uncomfortable, contact your physician.  RADIATION SAFETY GUIDELINES PROSTATE CANCER TREATMENT WITH RADIOACTIVE IODINE-125 SEED IMPLANT  The following guidelines will limit exposure to less than naturally occurring background radiation.  PERSONS AGE 8-45 (if able to become pregnant)  FOR 8 WEEKS FOLLOWING IMPLANT  At a distance of 1 foot: limit time to less than 2 hours/week At a distance of 3 feet: limit time to 20 hours/week At a distance of 6 feet: no restrictions  AFTER 8 WEEKS No restrictions  CHILDREN UNDER AGE 8, PREGNANT WOMEN OR POSSIBLY PREGNANT WOMEN  FOR 8 WEEKS FOLLOWING IMPLANT At a distance of 1 foot: limit  time to 10 minutes/week At a distance of 3 feet: limit time to 2 hours/week At a distance of 6 feet: no restrictions  AFTER 8 WEEKS No restrictions  PERSONS OVER THE AGE OF 45 AND DO NOT EXPECT TO HAVE ANY MORE CHILDREN No restrictions  Updated by [facility] in January 2020

## 2023-07-06 NOTE — H&P (Signed)
Office Visit Report     06/30/2023   --------------------------------------------------------------------------------   Arthur Barrett  MRN: [id number]  DOB: 02/23/1956, 67 year old Male  SSN: [id number]   PRIMARY CARE:  Gwen Pounds, MD  PRIMARY CARE FAX:  [phone]  REFERRING:  Jannifer Hick, MD  PROVIDER:  Marcine Matar, M.D.  TREATING:  Ulyses Amor, [place]  LOCATION:  [facility] [phone]     --------------------------------------------------------------------------------   CC/HPI: Pt presents today for pre-operative history and physical exam in anticipation of brachytherapy and space oar placement by Dr. Cardell Peach on 07/06/23. He is doing well and is without complaint.   He received cardiac clearance and is ok to stop Eliquis 3 days prior to procedure.   Pt denies F/C, HA, CP, SOB, N/V, diarrhea/constipation, back pain, flank pain, hematuria, and dysuria.     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   #1. Urolithiasis:  -Pre 2023: MET x1 over 30 years ago  -CT 12/2021 with 4 mm stone in the proximal ureter and 4 mm stone in the right upper pole.  -S/p R URS/LL of right ureteral stone on 02/03/2022 and second stage R URS on 02/17/2022.  -He denies abdominal pain or flank pain.   #2. Urinary urgency: He developed urinary urgency in 2023 and was started on Myrbetriq which she has seen significant improvement. His IPSS score is 6. He reports a strong flow stream. He has 2 time nocturia.   3. Localized favorable intermediate risk prostate cancer:  -Patient underwent prostate biopsy on 02/03/22 for a palpable right sided prostatic nodule. PSA 12/2021 was normal at 1.83. Biopsy revealed GS 3+3=6, adenocarcinoma of the prostate with 2/12 cores positive (5-10%) in the left apex lateral and left apex, TRUS volume of 45 cm3.  -Prostate MRI 01/13/2023 with small  PI-RADS category 4 lesion left anterior peripheral zone at the apex. No adenopathy. No bone metastasis. Prostate volume 31.9 cc.  -MRI fusion biopsy 02/18/2023 with GS 3+4 = 7 (GG2) at the ROI (left anterior peripheral zone at the apex) in 2/4 cores (5%, 50%) BPH in all remaining cores. TRUS volume 32 cc.   Recent PSA trend:  1.83 in 12/2021  2.28 and 06/2022  2.35 in 01/2023   Prostate MRI 01/13/2023 with small PI-RADS category 4 lesion left anterior peripheral zone at the apex. No adenopathy. No bone metastasis. Prostate volume 31.9 cc.   Denies new or worsening bone or back pain. Good appetite and stable weight.   Family history: Denies  Imaging studies: None   PMH: Atrial fibrillation on Eliquis  PSH: Appendectomy   TNM stage: [id number]  PSA: 2.35 in 01/2023  Gleason score: GS 3+4 = 7  Biopsy: 5//2024  Left: GS 3+4 = 7 at the left lateral anterior apex  Right: BPH only  Prostate volume: 32 cc  PSAD: 0.07   Nomogram  CSS (15 year): 99%  PFS (5 year, 10 year): 92%, 86%  EPE: 20 %  LNI: 2%  SVI: 1%   IPSS: 6  SHIM: He does have erectile dysfunction.   He has met with Dr. Kathrynn Running and has elected proceed with brachytherapy.   #4. Erectile dysfunction: He complains of difficulty obtaining maintain erection satisfactory for intercourse his most trouble is maintaining his erection. He estimates he obtains about 50% rigid erection. He denies taking nitroglycerin. He continues sildenafil with success.     ALLERGIES:  No Allergies    MEDICATIONS: Lipitor 20 mg tablet  Myrbetriq 25 mg tablet, extended release 24 hr 1 tablet PO Daily  Tamsulosin Hcl 0.4 mg capsule 1 capsule PO Q HS  Eliquis 5 mg tablet  Flomax 0.4 mg capsule  Lopressor 50 mg tablet  Voltaren Arthritis Pain     GU PSH: Cystoscopy Insert Stent - 02/17/2022 Cystoscopy Ureteroscopy - 02/17/2022 Prostate Needle Biopsy - 02/18/2023, 02/03/2022 Ureteroscopic laser litho - 02/03/2022       PSH Notes: Appendectomy  Back  surgery  cardiac ablation       NON-GU PSH: Appendectomy - 2012 Surgical Pathology, Gross And Microscopic Examination For Prostate Needle - 02/18/2023 Visit Complexity (formerly GPC1X) - 04/02/2023, 03/05/2023, 01/28/2023     GU PMH: ED due to arterial insufficiency - 04/02/2023, (Stable), - 03/05/2023, - 02/18/2023, - 01/28/2023, - 07/14/2022, - 03/12/2022, Erectile dysfunction due to arterial insufficiency, - 2014 Prostate Cancer - 04/02/2023, - 03/05/2023, - 02/18/2023, - 01/28/2023, - 07/14/2022, - 03/12/2022, - 02/11/2022 Renal calculus - 04/02/2023, - 02/18/2023, - 07/14/2022, - 03/12/2022, - 02/11/2022 Urinary Urgency - 04/02/2023, - 03/05/2023, - 01/28/2023, - 07/14/2022, - 03/12/2022, - 02/11/2022, - 2023 Ureteral calculus - 2023, - 2023 Prostate nodule w/ LUTS - 2023 Encounter for Prostate Cancer screening, Prostate cancer screening - 2014      PMH Notes:  1898-10-20 00:00:00 - Note: Normal Routine History And Physical Adult  2010-10-30 14:43:18 - Note: Skull Fracture   NON-GU PMH: Decreased libido, Decreased libido - 2014 Personal history of other mental and behavioral disorders, History of depression - 2014 Atrial Fibrillation    FAMILY HISTORY: 1 Daughter - Runs in Family 1 son - Runs in Family Chronic Obstructive Pulmonary Disease - Father Diabetes - Father Family Health Status Number - Runs In Family   SOCIAL HISTORY: Marital Status: Widowed Ethnicity: Not Hispanic Or Latino; Race: White Current Smoking Status: Patient has never smoked.   Tobacco Use Assessment Completed: Used Tobacco in last 30 days? Does not use smokeless tobacco. Has never drank.  Does not use drugs. Drinks 2 caffeinated drinks per day. Has not had a blood transfusion.     Notes: Alcohol Use, Occupation:, Marital History - Currently Married, Tobacco Use, Caffeine Use   REVIEW OF SYSTEMS:    GU Review Male:   Patient denies frequent urination, hard to postpone urination, burning/ pain with urination, get up at  night to urinate, leakage of urine, stream starts and stops, trouble starting your stream, have to strain to urinate , erection problems, and penile pain.  Gastrointestinal (Upper):   Patient denies nausea, vomiting, and indigestion/ heartburn.  Gastrointestinal (Lower):   Patient denies diarrhea and constipation.  Constitutional:   Patient denies fever, night sweats, weight loss, and fatigue.  Skin:   Patient denies skin rash/ lesion and itching.  Eyes:   Patient denies blurred vision and double vision.  Ears/ Nose/ Throat:   Patient denies sore throat and sinus problems.  Hematologic/Lymphatic:   Patient denies swollen glands and easy bruising.  Cardiovascular:   Patient denies leg swelling and chest pains.  Respiratory:   Patient denies cough and shortness of breath.  Endocrine:   Patient denies excessive thirst.  Musculoskeletal:   Patient denies back pain and joint pain.  Neurological:   Patient denies headaches and dizziness.  Psychologic:   Patient denies depression and anxiety.   VITAL SIGNS:      06/30/2023 01:19 PM  Weight 175 lb / 79.38 kg  Height 69 in /  175.26 cm  BP 123/73 mmHg  Heart Rate 52 /min  Temperature 98.6 F / 37 C  BMI 25.8 kg/m   MULTI-SYSTEM PHYSICAL EXAMINATION:    Constitutional: Well-nourished. No physical deformities. Normally developed. Good grooming.  Neck: Neck symmetrical, not swollen. Normal tracheal position.  Respiratory: Normal breath sounds. No labored breathing, no use of accessory muscles.   Cardiovascular: Regular rate and rhythm. No murmur, no gallop.   Lymphatic: No enlargement of neck, axillae, groin.  Skin: No paleness, no jaundice, no cyanosis. No lesion, no ulcer, no rash.  Neurologic / Psychiatric: Oriented to time, oriented to place, oriented to person. No depression, no anxiety, no agitation.  Gastrointestinal: No mass, no tenderness, no rigidity, non obese abdomen.  Eyes: Normal conjunctivae. Normal eyelids.  Ears, Nose, Mouth,  and Throat: Left ear no scars, no lesions, no masses. Right ear no scars, no lesions, no masses. Nose no scars, no lesions, no masses. Normal hearing. Normal lips.  Musculoskeletal: Normal gait and station of head and neck.     Complexity of Data:  Records Review:   Previous Patient Records  Urine Test Review:   Urinalysis   06/30/23  Urinalysis  Urine Appearance Clear   Urine Color Yellow   Urine Glucose Neg mg/dL  Urine Bilirubin Neg mg/dL  Urine Ketones Neg mg/dL  Urine Specific Gravity 1.025   Urine Blood Neg ery/uL  Urine pH 5.5   Urine Protein Neg mg/dL  Urine Urobilinogen 1.0 mg/dL  Urine Nitrites Neg   Urine Leukocyte Esterase Neg leu/uL   PROCEDURES:          Urinalysis - 81003 Dipstick Dipstick Cont'd  Color: Yellow Bilirubin: Neg mg/dL  Appearance: Clear Ketones: Neg mg/dL  Specific Gravity: 1.478 Blood: Neg ery/uL  pH: 5.5 Protein: Neg mg/dL  Glucose: Neg mg/dL Urobilinogen: 1.0 mg/dL    Nitrites: Neg    Leukocyte Esterase: Neg leu/uL    ASSESSMENT:      ICD-10 Details  1 GU:   Prostate Cancer - C61    PLAN:           Schedule Return Visit/Planned Activity: Keep Scheduled Appointment - Schedule Surgery          Document Letter(s):  Created for Patient: Clinical Summary         Notes:   There are no changes in the patients history or physical exam since last evaluation by Dr. Cardell Peach. Pt is scheduled to undergo brachytherapy and space oar on 07/06/23.   All pt's questions were answered to the best of my ability.          Next Appointment:      Next Appointment: 07/06/2023 01:00 PM    Appointment Type: Surgery     Location: [facility] [phone]    Provider: Jettie Pagan, M.D.    Reason for Visit: NE/OP BRACHYTHERAPY AND SPACE OAR    Urology Preoperative H&P   Chief Complaint: Prostate cancer  History of Present Illness: Arthur Barrett is a 67 y.o. male with prostate cancer here for brachytherapy with SpaceOAR. Denies  fevers, chills, dysuria.  Past Medical History:  Diagnosis Date   History of abnormal  myocardial perfusion imaging scan 12/26/2021   mild systolic dysfunction ef 46 % intermediate risk study see further results epic report   History of atrial fibrillation    s/p ablation 04-29-2022 with dr Elberta Fortis   History of COVID-19 03/2021   mild all symptoms resolved   History of kidney stones  yrs ago per pt   History of skull fracture    13 yrs ago tree fell on pt no surgery done no residual treated at [facility]   History of transesophageal echocardiography (TEE) 12/06/2021   successful cardioversion done, ef 50 to 55 %   Intermittent palpitations    Prostate cancer (HCC)    Wears glasses     Past Surgical History:  Procedure Laterality Date   APPENDECTOMY     61 yrs agp per pt at [facility]   ATRIAL FIBRILLATION ABLATION N/A 04/29/2022   Procedure: ATRIAL FIBRILLATION ABLATION;  Surgeon: Regan Lemming, MD;  Location: [facility] INVASIVE CV LAB;  Service: Cardiovascular;  Laterality: N/A;   CARDIOVERSION N/A 12/06/2021   Procedure: CARDIOVERSION;  Surgeon: Chrystie Nose, MD;  Location: [facility] ENDOSCOPY;  Service: Cardiovascular;  Laterality: N/A;   colonscopy      2018 or 2019 normal per pt   CYSTOSCOPY/URETEROSCOPY/HOLMIUM LASER/STENT PLACEMENT Right 02/03/2022   Procedure: CYSTOSCOPY/RETROGRADE/URETEROSCOPY/HOLMIUM LASER/STENT PLACEMENT;  Surgeon: Jannifer Hick, MD;  Location[facility];  Service: Urology;  Laterality: Right;   CYSTOSCOPY/URETEROSCOPY/HOLMIUM LASER/STENT PLACEMENT Right 02/17/2022   Procedure: CYSTOSCOPY/ RETROGRADE/URETEROSCOPY/STENT EXCHANGED;  Surgeon: Jannifer Hick, MD;  Location[facility];  Service: Urology;  Laterality: Right;   LUMBAR LAMINECTOMY/DECOMPRESSION MICRODISCECTOMY Left 01/31/2015   Procedure: MICRO LUMBAR DECOMPRESSION LUMBAR FIVE TO SACRAL ONE ON LEFT;  Surgeon: Jene Every, MD;  Location: [facility] ORS;  Service:  Orthopedics;  Laterality: Left;   PROSTATE BIOPSY N/A 02/03/2022   Procedure: BIOPSY TRANSRECTAL ULTRASONIC PROSTATE (TUBP);  Surgeon: Jannifer Hick, MD;  Location[facility];  Service: Urology;  Laterality: N/A;   TEE WITHOUT CARDIOVERSION N/A 12/06/2021   Procedure: TRANSESOPHAGEAL ECHOCARDIOGRAM (TEE);  Surgeon: Chrystie Nose, MD;  Location[facility] ENDOSCOPY;  Service: Cardiovascular;  Laterality: N/A;    Allergies: No Known Allergies  Family History  Family history unknown: Yes    Social History:  reports that he has never smoked. He has never used smokeless tobacco. He reports that he does not drink alcohol and does not use drugs.  ROS: A complete review of systems was performed.  All systems are negative except for pertinent findings as noted.  Physical Exam:  Vital signs in last 24 hours: Temp:  [97.8 F (36.6 C)] 97.8 F (36.6 C) (09/16 1048) Pulse Rate:  [56] 56 (09/16 1048) Resp:  [17] 17 (09/16 1048) BP: (123)/(94) 123/94 (09/16 1048) SpO2:  [100 %] 100 % (09/16 1048) Weight:  [76.3 kg] 76.3 kg (09/16 1048) Constitutional:  Alert and oriented, No acute distress Cardiovascular: Regular rate and rhythm Respiratory: Normal respiratory effort, Lungs clear bilaterally GI: Abdomen is soft, nontender, nondistended, no abdominal masses GU: No CVA tenderness Lymphatic: No lymphadenopathy Neurologic: Grossly intact, no focal deficits Psychiatric: Normal mood and affect  Laboratory Data:  Recent Labs    07/06/23 1056  HGB 15.0  HCT 44.0    Recent Labs    07/06/23 1056  NA 142  K 3.6  CL 102  GLUCOSE 92  BUN 9  CREATININE 0.90     Results for orders placed or performed during the hospital encounter of 07/06/23 (from the past 24 hour(s))  I-STAT, chem 8     Status: None   Collection Time: 07/06/23 10:56 AM  Result Value Ref Range   Sodium 142 135 - 145 mmol/L   Potassium 3.6 3.5 - 5.1 mmol/L   Chloride 102 98 - 111 mmol/L  BUN 9 8 - 23  mg/dL   Creatinine, Ser 1.61 0.61 - 1.24 mg/dL   Glucose, Bld 92 70 - 99 mg/dL   Calcium, Ion 0.96 0.45 - 1.40 mmol/L   TCO2 24 22 - 32 mmol/L   Hemoglobin 15.0 13.0 - 17.0 g/dL   HCT 40.9 81.1 - 91.4 %   No results found for this or any previous visit (from the past 240 hour(s)).  Renal Function: Recent Labs    07/06/23 1056  CREATININE 0.90   Estimated Creatinine Clearance: 80.7 mL/min (by C-G formula based on SCr of 0.9 mg/dL).  Radiologic Imaging: No results found.  I independently reviewed the above imaging studies.  Assessment and Plan Arthur Barrett is a 66 y.o. male with prostate cancer here for brachytherapy with Space Oar.  Arthur R. Kyden Potash MD 07/06/2023, 12:52 PM  [facility] Pager: [phone]

## 2023-07-06 NOTE — Op Note (Signed)
PATIENT:  Arthur Barrett  PRE-OPERATIVE DIAGNOSIS:  Adenocarcinoma of the prostate  POST-OPERATIVE DIAGNOSIS:  Same  PROCEDURE:  1. I-125 radioactive seed implantation 2. Cystoscopy  3. Placement of SpaceOAR  SURGEON:  Jettie Pagan, MD  Radiation oncologist: Margaretmary Dys, MD  ANESTHESIA:  General  EBL:  Minimal  DRAINS: None  INDICATION: Arthur Barrett  Description of procedure: After informed consent the patient was brought to the major OR, placed on the table and administered general anesthesia. He was then moved to the modified lithotomy position with his perineum perpendicular to the floor. His perineum and genitalia were then sterilely prepped. An official timeout was then performed. A 16 French Foley catheter was then placed in the bladder and filled with dilute contrast, a rectal tube was placed in the rectum and the transrectal ultrasound probe was placed in the rectum and affixed to the stand. He was then sterilely draped.  Real time ultrasonography was used along with the seed planning software. This was used to develop the seed plan including the number of needles as well as number of seeds required for complete and adequate coverage. Real-time ultrasonography was then used along with the previously developed plan and the Nucletron device to implant a total of 71 seeds using 18 needles. This proceeded without difficulty or complication.   I then proceeded with placement of SpaceOAR by introducing a needle with the bevel angled inferiorly approximately 2 cm superior to the anus. This was angled downward and under direct ultrasound was placed within the space between the prostatic capsule and rectum. This was confirmed with a small amount of sterile saline injected and this was performed under direct ultrasound. I then attached the SpaceOAR to the needle and injected this in the space between the prostate and rectum with good placement noted.  A Foley catheter was  then removed as well as the transrectal ultrasound probe and rectal probe. Flexible cystoscopy was then performed using the 16 French flexible scope which revealed a normal urethra throughout its length down to the sphincter which appeared intact. The prostatic urethra revealed bilobar hypertrophy but no evidence of obstruction, seeds, spacers or lesions. The bladder was then entered and fully and systematically inspected. The ureteral orifices were noted to be of normal configuration and position. The mucosa revealed no evidence of tumors. There were also no stones identified within the bladder. I noted no seeds or spacers on the floor of the bladder and retroflexion of the scope revealed no seeds protruding from the base of the prostate.  The cystoscope was then removed and the patient was awakened and taken to recovery room in stable and satisfactory condition. He tolerated procedure well and there were no intraoperative complications.   Matt R. Robey Massmann MD [facility]  Pager: [phone]

## 2023-07-07 ENCOUNTER — Other Ambulatory Visit (HOSPITAL_COMMUNITY): Payer: Self-pay

## 2023-07-07 ENCOUNTER — Encounter (HOSPITAL_BASED_OUTPATIENT_CLINIC_OR_DEPARTMENT_OTHER): Payer: Self-pay | Admitting: Urology

## 2023-07-07 DIAGNOSIS — E785 Hyperlipidemia, unspecified: Secondary | ICD-10-CM | POA: Diagnosis not present

## 2023-07-07 MED ORDER — ATORVASTATIN CALCIUM 20 MG PO TABS
20.0000 mg | ORAL_TABLET | Freq: Every day | ORAL | 1 refills | Status: DC
  Filled 2023-07-07: qty 90, 90d supply, fill #0
  Filled 2023-10-02: qty 90, 90d supply, fill #1

## 2023-07-09 NOTE — Anesthesia Postprocedure Evaluation (Signed)
Anesthesia Post Note  Patient: Arthur Barrett  Procedure(s) Performed: RADIOACTIVE SEED IMPLANT/BRACHYTHERAPY IMPLANT (Prostate) SPACE OAR INSTILLATION (Prostate) CYSTOSCOPY FLEXIBLE (Bladder)     Patient location during evaluation: PACU Anesthesia Type: General Level of consciousness: sedated and patient cooperative Pain management: pain level controlled Vital Signs Assessment: post-procedure vital signs reviewed and stable Respiratory status: spontaneous breathing Cardiovascular status: stable Anesthetic complications: no   No notable events documented.  Last Vitals:  Vitals:   07/06/23 1515 07/06/23 1536  BP: 134/84 (!) 142/82  Pulse: (!) 55 (!) 57  Resp: 16 16  Temp:  36.7 C  SpO2: 95% 96%    Last Pain:  Vitals:   07/07/23 0934  TempSrc:   PainSc: 2                  Lewie Loron

## 2023-07-13 DIAGNOSIS — E0580 Other thyrotoxicosis without thyrotoxic crisis or storm: Secondary | ICD-10-CM | POA: Diagnosis not present

## 2023-07-13 DIAGNOSIS — Z0000 Encounter for general adult medical examination without abnormal findings: Secondary | ICD-10-CM | POA: Diagnosis not present

## 2023-07-13 DIAGNOSIS — C61 Malignant neoplasm of prostate: Secondary | ICD-10-CM | POA: Diagnosis not present

## 2023-07-13 DIAGNOSIS — Z7901 Long term (current) use of anticoagulants: Secondary | ICD-10-CM | POA: Diagnosis not present

## 2023-07-13 DIAGNOSIS — R82998 Other abnormal findings in urine: Secondary | ICD-10-CM | POA: Diagnosis not present

## 2023-07-13 DIAGNOSIS — E785 Hyperlipidemia, unspecified: Secondary | ICD-10-CM | POA: Diagnosis not present

## 2023-07-13 DIAGNOSIS — D6869 Other thrombophilia: Secondary | ICD-10-CM | POA: Diagnosis not present

## 2023-07-13 DIAGNOSIS — I252 Old myocardial infarction: Secondary | ICD-10-CM | POA: Diagnosis not present

## 2023-07-13 DIAGNOSIS — I4892 Unspecified atrial flutter: Secondary | ICD-10-CM | POA: Diagnosis not present

## 2023-07-13 DIAGNOSIS — I77819 Aortic ectasia, unspecified site: Secondary | ICD-10-CM | POA: Diagnosis not present

## 2023-07-13 DIAGNOSIS — I5020 Unspecified systolic (congestive) heart failure: Secondary | ICD-10-CM | POA: Diagnosis not present

## 2023-07-13 NOTE — Progress Notes (Signed)
Patient was a RadOnc Consult on 03/20/23 for his stage T2a adenocarcinoma of the prostate with Gleason score of 3+4, and PSA of 2.35.  Patient proceed with treatment recommendations of brachytherapy and had his treatment on 07/06/23.   Patient is scheduled for a post seed CT Simulation on 10/9, and a post op appointment with [facility] on 07/20/23.

## 2023-07-16 ENCOUNTER — Other Ambulatory Visit (HOSPITAL_COMMUNITY): Payer: Self-pay

## 2023-07-16 ENCOUNTER — Other Ambulatory Visit: Payer: Self-pay

## 2023-07-17 ENCOUNTER — Other Ambulatory Visit: Payer: Self-pay

## 2023-07-20 ENCOUNTER — Other Ambulatory Visit (HOSPITAL_COMMUNITY): Payer: Self-pay

## 2023-07-20 DIAGNOSIS — R102 Pelvic and perineal pain: Secondary | ICD-10-CM | POA: Diagnosis not present

## 2023-07-20 MED ORDER — HYDROCODONE-ACETAMINOPHEN 5-325 MG PO TABS
0.5000 | ORAL_TABLET | Freq: Four times a day (QID) | ORAL | 0 refills | Status: DC | PRN
Start: 2023-07-20 — End: 2023-08-03
  Filled 2023-07-20: qty 20, 5d supply, fill #0

## 2023-07-21 ENCOUNTER — Other Ambulatory Visit (HOSPITAL_COMMUNITY): Payer: Self-pay

## 2023-07-28 ENCOUNTER — Telehealth: Payer: Self-pay | Admitting: *Deleted

## 2023-07-28 NOTE — Telephone Encounter (Signed)
CALLED PATIENT TO REMIND OF POST SEED APPTS. FOR 07-29-23, LVM FOR A RETURN CALL

## 2023-07-29 ENCOUNTER — Encounter: Payer: Self-pay | Admitting: Urology

## 2023-07-29 ENCOUNTER — Ambulatory Visit
Admission: RE | Admit: 2023-07-29 | Discharge: 2023-07-29 | Disposition: A | Discharge status: Home / Self Care | Payer: PPO | Source: Ambulatory Visit | Attending: Radiation Oncology | Admitting: Radiation Oncology

## 2023-07-29 ENCOUNTER — Ambulatory Visit
Admission: RE | Admit: 2023-07-29 | Discharge: 2023-07-29 | Disposition: A | Discharge status: Home / Self Care | Payer: PPO | Source: Ambulatory Visit | Attending: Urology | Admitting: Urology

## 2023-07-29 VITALS — BP 118/84 | HR 118 | Temp 97.5°F | Resp 18 | Ht 69.0 in | Wt 163.4 lb

## 2023-07-29 DIAGNOSIS — C61 Malignant neoplasm of prostate: Secondary | ICD-10-CM | POA: Insufficient documentation

## 2023-07-29 DIAGNOSIS — Z7901 Long term (current) use of anticoagulants: Secondary | ICD-10-CM | POA: Insufficient documentation

## 2023-07-29 DIAGNOSIS — Z923 Personal history of irradiation: Secondary | ICD-10-CM | POA: Insufficient documentation

## 2023-07-29 DIAGNOSIS — Z79899 Other long term (current) drug therapy: Secondary | ICD-10-CM | POA: Insufficient documentation

## 2023-07-29 NOTE — Progress Notes (Signed)
Radiation Oncology         [phone]) [phone] ________________________________  Name: Arthur Barrett MRN: [id number]  Date: 07/29/2023  DOB: 1956/01/08  Post-Seed Follow-Up Visit Note  CC: Creola Corn, MD  Jannifer Hick, MD  Diagnosis:   67 y.o. gentleman with Stage T2a adenocarcinoma of the prostate with Gleason score of 3+4, and PSA of 2.35.     ICD-10-CM   1. Malignant neoplasm of prostate (HCC)  C61       Interval Since Last Radiation:  3 weeks 07/06/23:  Insertion of radioactive I-125 seeds into the prostate gland; 145 Gy, definitive therapy with placement of SpaceOAR gel.  Narrative:  The patient returns today for routine follow-up.  He is complaining of increased urinary frequency and urinary hesitation symptoms. He filled out a questionnaire regarding urinary function today providing and overall IPSS score of 25 characterizing his symptoms as severe with.urgency, frequency, weak flow of stream, hesitancy, intermittency and feelings of incomplete bladder emptying despite taking Flomax daily as prescribed. He denies gross hematuria, dysuria, fever or chills.  His pre-implant score was 6. He has been taking Vicodin prn significant groin and perineal discomfort and feels that this is gradually improving over the past 2 days. He denies any abdominal pain or bowel symptoms. He has had some decreased stamina but has been able to remain active.  ALLERGIES:  has No Known Allergies.  Meds: Current Outpatient Medications  Medication Sig Dispense Refill   acetaminophen (TYLENOL) 500 MG tablet Take 500 mg by mouth every 6 (six) hours as needed.     ALPRAZolam (XANAX) 0.25 MG tablet Take 1 tablet (0.25 mg total) by mouth at bedtime as needed. 30 tablet 3   apixaban (ELIQUIS) 5 MG TABS tablet Take 1 tablet (5 mg total) by mouth 2 (two) times daily. 60 tablet 5   Ascorbic Acid (VITAMIN C PO) Take 1 tablet by mouth daily. (Patient not taking: Reported on 09/19/2022)     atorvastatin (LIPITOR) 20  MG tablet Take 1 tablet (20 mg total) by mouth at bedtime. 90 tablet 1   diphenhydrAMINE (BENADRYL) 25 mg capsule Take 25 mg by mouth at bedtime.     docusate sodium (COLACE) 100 MG capsule Take 1 capsule (100 mg total) by mouth daily as needed 30 capsule 0   fluticasone (FLONASE) 50 MCG/ACT nasal spray Place 1 spray into both nostrils at bedtime.     GLUCOSAMINE-CHONDROITIN PO Take 2 tablets by mouth daily. (Patient not taking: Reported on 06/26/2023)     HYDROcodone-acetaminophen (NORCO/VICODIN) 5-325 MG tablet Take 1/2-1 tablet by mouth every 6 (six) hours as needed. 20 tablet 0   metoprolol succinate (TOPROL-XL) 50 MG 24 hr tablet Take 1 tablet (50 mg total) by mouth at bedtime. Take with or immediately following a meal. 90 tablet 2   mirabegron ER (MYRBETRIQ) 25 MG TB24 tablet Take 1 tablet (25 mg total) by mouth daily. 90 tablet 3   Multiple Vitamins-Minerals (MULTIVITAMIN WITH MINERALS) tablet Take 1 tablet by mouth daily. men's (Patient not taking: Reported on 06/26/2023)     Omega-3 Fatty Acids (FISH OIL) 1000 MG CAPS Take 2,000 mg by mouth daily. (Patient not taking: Reported on 06/26/2023)     oxyCODONE-acetaminophen (PERCOCET) 5-325 MG tablet Take 1 tablet by mouth every 4 (four) hours as needed for severe pain. 18 tablet 0   Saw Palmetto, Serenoa repens, (SAW PALMETTO PO) Take 2 capsules by mouth daily. (Patient not taking: Reported on 06/26/2023)     tamsulosin (  FLOMAX) 0.4 MG CAPS capsule Take 1 capsule (0.4 mg total) by mouth daily. (Patient taking differently: Take 0.4 mg by mouth at bedtime.) 90 capsule 3   No current facility-administered medications for this visit.    Physical Findings: In general this is a well appearing Caucasian male in no acute distress. He's alert and oriented x4 and appropriate throughout the examination. Cardiopulmonary assessment is negative for acute distress and he exhibits normal effort.   Lab Findings: Lab Results  Component Value Date   WBC 6.3  05/05/2022   HGB 15.0 07/06/2023   HCT 44.0 07/06/2023   MCV 94 05/05/2022   PLT 217 05/05/2022    Radiographic Findings:  Patient underwent CT imaging in our clinic for post implant dosimetry. The CT will be reviewed by Dr. Kathrynn Running to confirm there is an adequate distribution of radioactive seeds throughout the prostate gland and ensure that there are no seeds in or near the rectum.  We suspect the final radiation plan and dosimetry will show appropriate coverage of the prostate gland. He understands that we will call and inform him of any unexpected findings on further review of his imaging and dosimetry.  Impression/Plan: The patient is recovering from the effects of radiation. His urinary symptoms should gradually improve over the next 4-6 months. We talked about this today and we are going to try increasing his Flomax to BID for the next several weeks to see if this will help with his LUTS. He is encouraged by his improvement already and is otherwise pleased with his outcome. We also talked about long-term follow-up for prostate cancer following seed implant. He understands that ongoing PSA determinations and digital rectal exams will help perform surveillance to rule out disease recurrence. He was seen in the urology office with Dr. Annabell Howells on 07/20/23 with severe perineal discomfort and was prescribed Vicodin to use prn for pain since he is unable to take NSAIDs and was not getting any relief with the Tramadol that he got at discharge from the surgery center. There was no obvious hematoma or ulceration on exam and fortunately, the pain has been improving over past 2 days. He has a follow up appointment scheduled with Anne Fu, NP 08/07/23 and then will see Dr. Cardell Peach in 10/2023. He understands what to expect with his PSA measures. Patient was also educated today about some of the long-term effects from radiation including a small risk for rectal bleeding and possibly erectile dysfunction. We talked  about some of the general management approaches to these potential complications. However, I did encourage the patient to contact our office or return at any point if he has questions or concerns related to his previous radiation and prostate cancer.    Marguarite Arbour, [name]

## 2023-07-29 NOTE — Progress Notes (Signed)
Post-seed nursing interview for a diagnosis of Stage T2a adenocarcinoma of the prostate with Gleason score of 3+4, and PSA of 2.35.  Patient identity verified x2. Patient reports groin and urinary discomfort 5/10. Patient denies any other related issues at this time.  Meaningful use complete.  I-PSS score- 25 - Severe SHIM score- 1- No sexual activity within 6 months. Urinary Management medication(s) Tamsulosin Urology appointment date- None currently with Dr. Cardell Peach at [facility]   Vitals- BP 118/84 (BP Location: Left Arm, Patient Position: Sitting, Cuff Size: Normal)   Pulse (!) 118   Temp (!) 97.5 F (36.4 C) (Temporal)   Resp 18   Ht 5\' 9"  (1.753 m)   Wt 163 lb 6.4 oz (74.1 kg)   SpO2 99%   BMI 24.13 kg/m   This concludes the interaction.  Ruel Favors, LPN

## 2023-07-29 NOTE — Progress Notes (Signed)
Radiation Oncology         (336) [phone] ________________________________  Name: Arthur Barrett MRN: [id number]  Date: 07/29/2023  DOB: [birth year]  COMPLEX SIMULATION NOTE  NARRATIVE:  The patient was brought to the CT Simulation planning suite today following prostate seed implantation approximately one month ago.  Identity was confirmed.  All relevant records and images related to the planned course of therapy were reviewed.  Then, the patient was set-up supine.  CT images were obtained.  The CT images were loaded into the planning software.  Then the prostate and rectum were contoured.  Treatment planning then occurred.  The implanted iodine 125 seeds were identified by the physics staff for projection of radiation distribution  I have requested : 3D Simulation  I have requested a DVH of the following structures: Prostate and rectum.    ________________________________  [name], M.D.

## 2023-07-31 ENCOUNTER — Other Ambulatory Visit (HOSPITAL_COMMUNITY): Payer: Self-pay

## 2023-07-31 MED ORDER — ALPRAZOLAM 0.25 MG PO TABS
0.2500 mg | ORAL_TABLET | Freq: Every evening | ORAL | 3 refills | Status: DC | PRN
  Filled 2023-07-31: qty 30, 30d supply, fill #0
  Filled 2023-08-23 – 2023-08-31 (×2): qty 30, 30d supply, fill #1
  Filled 2023-10-02: qty 30, 30d supply, fill #2
  Filled 2023-10-30: qty 30, 30d supply, fill #3

## 2023-08-03 ENCOUNTER — Other Ambulatory Visit: Payer: Self-pay | Admitting: Urology

## 2023-08-03 ENCOUNTER — Encounter: Payer: Self-pay | Admitting: Radiation Oncology

## 2023-08-03 DIAGNOSIS — Z191 Hormone sensitive malignancy status: Secondary | ICD-10-CM | POA: Diagnosis not present

## 2023-08-03 DIAGNOSIS — C61 Malignant neoplasm of prostate: Secondary | ICD-10-CM | POA: Diagnosis not present

## 2023-08-03 MED ORDER — HYDROCODONE-ACETAMINOPHEN 5-325 MG PO TABS
0.5000 | ORAL_TABLET | Freq: Four times a day (QID) | ORAL | 0 refills | Status: AC | PRN
  Filled 2023-08-03 – 2023-08-04 (×2): qty 30, 8d supply, fill #0

## 2023-08-03 NOTE — Progress Notes (Signed)
Radiation Oncology         [phone] ________________________________  Name: Arthur Barrett MRN: [id number]  Date: 08/03/2023  DOB: 02-08-1956  3D Planning Note   Prostate Brachytherapy Post-Implant Dosimetry  Diagnosis: 67 y.o. gentleman with Stage T2a adenocarcinoma of the prostate with Gleason score of 3+4, and PSA of 2.35.   Narrative: On a previous date, Arthur Barrett returned following prostate seed implantation for post implant planning. He underwent CT scan complex simulation to delineate the three-dimensional structures of the pelvis and demonstrate the radiation distribution.  Since that time, the seed localization, and complex isodose planning with dose volume histograms have now been completed.  Results:   Prostate Coverage - The dose of radiation delivered to the 90% or more of the prostate gland (D90) was 103.24% of the prescription dose. This exceeds our goal of greater than 90%. Rectal Sparing - The volume of rectal tissue receiving the prescription dose or higher was 0.0 cc. This falls under our thresholds tolerance of 1.0 cc.  Impression: The prostate seed implant appears to show adequate target coverage and appropriate rectal sparing.  Plan:  The patient will continue to follow with urology for ongoing PSA determinations. I would anticipate a high likelihood for local tumor control with minimal risk for rectal morbidity.  ________________________________  [name], M.D.

## 2023-08-03 NOTE — Radiation Completion Notes (Addendum)
  Radiation Oncology         [phone] ________________________________  Name: Arthur Barrett MRN: [id number]  Date: 08/03/2023  DOB: 1956/10/19  Referring Physician: DONNICE SIAD, M.D. Date of Service: 2023-08-03 Radiation Oncologist: Adina Barge, M.D. [facility]     RADIATION ONCOLOGY END OF TREATMENT NOTE     Diagnosis: 66 y.o. gentleman with Stage T2a adenocarcinoma of the prostate with Gleason score of 3+4, and PSA of 2.35.   Intent: Curative     ==========DELIVERED PLANS==========  Prostate Seed Implant Date: 2023-07-06   Plan Name: Prostate Seed Implant Site: Prostate Technique: Radioactive Seed Implant I-125 Mode: Brachytherapy Dose Per Fraction: 145 Gy Prescribed Dose (Delivered / Prescribed): 145 Gy / 145 Gy Prescribed Fxs (Delivered / Prescribed): 1 / 1     ==========ON TREATMENT VISIT DATES========== 2023-07-06     The patient will receive a call in about one month from the radiation oncology department. He will continue follow up with Dr. SIAD as well.  ------------------------------------------------   DONNICE Barge, MD [facility]  Radiation Oncology Direct Dial: [phone]  Fax: [phone] [email or web address]  Skype  LinkedIn

## 2023-08-04 ENCOUNTER — Telehealth: Payer: Self-pay

## 2023-08-04 ENCOUNTER — Other Ambulatory Visit (HOSPITAL_COMMUNITY): Payer: Self-pay

## 2023-08-04 ENCOUNTER — Other Ambulatory Visit: Payer: Self-pay

## 2023-08-04 NOTE — Telephone Encounter (Signed)
RN called patient back with pain medication refill request per [name], [name] he was told it was sent to his pharmacy on file [facility].  He was appreciative of the call and will pick it up today.

## 2023-08-06 ENCOUNTER — Encounter: Payer: Self-pay | Admitting: *Deleted

## 2023-08-07 ENCOUNTER — Other Ambulatory Visit (HOSPITAL_COMMUNITY): Payer: Self-pay

## 2023-08-07 ENCOUNTER — Encounter: Payer: Self-pay | Admitting: *Deleted

## 2023-08-07 DIAGNOSIS — C61 Malignant neoplasm of prostate: Secondary | ICD-10-CM | POA: Diagnosis not present

## 2023-08-07 DIAGNOSIS — R102 Pelvic and perineal pain: Secondary | ICD-10-CM | POA: Diagnosis not present

## 2023-08-07 MED ORDER — TAMSULOSIN HCL 0.4 MG PO CAPS
0.4000 mg | ORAL_CAPSULE | Freq: Two times a day (BID) | ORAL | 11 refills | Status: AC
  Filled 2023-08-09 – 2023-08-25 (×3): qty 60, 30d supply, fill #0
  Filled 2023-09-19 – 2023-09-25 (×3): qty 60, 30d supply, fill #1
  Filled 2023-10-23: qty 60, 30d supply, fill #2
  Filled 2023-11-19 – 2023-11-23 (×2): qty 60, 30d supply, fill #3
  Filled 2023-12-19: qty 60, 30d supply, fill #4
  Filled 2024-01-18: qty 60, 30d supply, fill #5
  Filled 2024-02-17: qty 60, 30d supply, fill #6
  Filled 2024-03-16: qty 60, 30d supply, fill #7
  Filled 2024-04-22: qty 60, 30d supply, fill #8
  Filled 2024-05-20: qty 60, 30d supply, fill #9
  Filled 2024-06-25: qty 60, 30d supply, fill #10
  Filled 2024-07-22: qty 60, 30d supply, fill #11

## 2023-08-09 ENCOUNTER — Other Ambulatory Visit (HOSPITAL_COMMUNITY): Payer: Self-pay

## 2023-08-09 ENCOUNTER — Other Ambulatory Visit: Payer: Self-pay | Admitting: Cardiology

## 2023-08-09 DIAGNOSIS — I4891 Unspecified atrial fibrillation: Secondary | ICD-10-CM

## 2023-08-10 ENCOUNTER — Other Ambulatory Visit: Payer: Self-pay

## 2023-08-10 ENCOUNTER — Other Ambulatory Visit (HOSPITAL_COMMUNITY): Payer: Self-pay

## 2023-08-10 MED ORDER — APIXABAN 5 MG PO TABS
5.0000 mg | ORAL_TABLET | Freq: Two times a day (BID) | ORAL | 5 refills | Status: DC
  Filled 2023-08-10: qty 60, 30d supply, fill #0
  Filled 2023-09-10: qty 60, 30d supply, fill #1
  Filled 2023-10-23: qty 60, 30d supply, fill #2
  Filled 2023-11-18: qty 60, 30d supply, fill #3
  Filled 2023-12-17: qty 60, 30d supply, fill #4
  Filled 2024-01-16: qty 60, 30d supply, fill #5

## 2023-08-10 NOTE — Telephone Encounter (Signed)
Prescription refill request for Eliquis received. Indication:afib Last office visit:9/24 Scr:0.90  9/24 Age: 67 Weight:74.1  kg  Prescription refilled

## 2023-08-11 ENCOUNTER — Other Ambulatory Visit: Payer: Self-pay

## 2023-08-17 ENCOUNTER — Ambulatory Visit: Payer: PPO | Attending: Cardiology | Admitting: Cardiology

## 2023-08-17 ENCOUNTER — Encounter: Payer: Self-pay | Admitting: Cardiology

## 2023-08-17 VITALS — BP 120/84 | HR 61 | Ht 69.0 in | Wt 166.8 lb

## 2023-08-17 DIAGNOSIS — Z79899 Other long term (current) drug therapy: Secondary | ICD-10-CM | POA: Diagnosis not present

## 2023-08-17 DIAGNOSIS — I480 Paroxysmal atrial fibrillation: Secondary | ICD-10-CM | POA: Diagnosis not present

## 2023-08-17 DIAGNOSIS — D6869 Other thrombophilia: Secondary | ICD-10-CM

## 2023-08-17 NOTE — Progress Notes (Signed)
Electrophysiology Office Note:   Date:  08/17/2023  ID:  ANCE REDIC, DOB [birth year], MRN [id number]  Primary Cardiologist: Thomasene Ripple, DO Electrophysiologist: Regan Lemming, MD      History of Present Illness:   Arthur Barrett is a 67 y.o. male with h/o atrial fibrillation seen today for routine electrophysiology followup.       He has a history significant for atrial fibrillation. He was admitted February 2023 to [facility] hospital was noted to be in atrial fibrillation at the time. He had a TEE and cardioversion. He was again seen in clinic 12/18/2021 and was experiencing palpitations. He was started on flecainide at that time. He had a Myoview that showed no ischemia but evidence of an infarct. He was switched to amiodarone. He is now status post atrial fibrillation ablation 04/29/2022.         Since last being seen in our clinic the patient reports doing overall well.  He recently had prostate surgery with beads implanted.  He has been recovering from this slowly.  He was also found to be hypothyroid by his primary physician, though I do not have access to those labs.  His primary physician felt that it may be related to amiodarone.  he denies chest pain, palpitations, dyspnea, PND, orthopnea, nausea, vomiting, dizziness, syncope, edema, weight gain, or early satiety.   Review of systems complete and found to be negative unless listed in HPI.   EP Information / Studies Reviewed:    EKG is ordered today. Personal review as below.  EKG Interpretation Date/Time:  Monday August 17 2023 08:56:03 EDT Ventricular Rate:  61 PR Interval:  172 QRS Duration:  102 QT Interval:  436 QTC Calculation: 438 R Axis:   53  Text Interpretation: Normal sinus rhythm Incomplete right bundle branch block Borderline ECG When compared with ECG of 27-May-2022 13:35, No significant change was found Confirmed by Kassidee Narciso ([id number]) on 08/17/2023 9:00:16 AM     Risk  Assessment/Calculations:    CHA2DS2-VASc Score = 3   This indicates a 3.2% annual risk of stroke. The patient's score is based upon: CHF History: 1 HTN History: 0 Diabetes History: 0 Stroke History: 0 Vascular Disease History: 1 Age Score: 1 Gender Score: 0             Physical Exam:   VS:  BP 120/84   Pulse 61   Ht 5\' 9"  (1.753 m)   Wt 166 lb 12.8 oz (75.7 kg)   SpO2 93%   BMI 24.63 kg/m    Wt Readings from Last 3 Encounters:  08/17/23 166 lb 12.8 oz (75.7 kg)  07/29/23 163 lb 6.4 oz (74.1 kg)  07/06/23 168 lb 3.2 oz (76.3 kg)     GEN: Well nourished, well developed in no acute distress NECK: No JVD; No carotid bruits CARDIAC: Regular rate and rhythm, no murmurs, rubs, gallops RESPIRATORY:  Clear to auscultation without rales, wheezing or rhonchi  ABDOMEN: Soft, non-tender, non-distended EXTREMITIES:  No edema; No deformity   ASSESSMENT AND PLAN:    1.  Paroxysmal atrial fibrillation/typical atrial flutter: Post ablation 04/29/2022.  Currently on Toprol-XL.  Remains in sinus rhythm.  Potentially had atrial fibrillation over the summer but has not had any further.  Was dehydrated during that episode.  Anzel Kearse continue with current management.  2.  Chronic systolic heart failure: Repeat echo with normalization of ejection fraction.  Continue current management.  3.  Secondary hypercoagulable state: Currently on Eliquis for atrial fibrillation  4.  Hypothyroidism: Reported by the patient on labs by his primary physician.  He was on amiodarone, but was only on it for short period of time.  Further management per primary physician.  Follow up with Dr. Elberta Fortis in 12 months  Signed, Taseen Marasigan Jorja Loa, MD

## 2023-08-17 NOTE — Patient Instructions (Signed)
Medication Instructions:  Your physician recommends that you continue on your current medications as directed. Please refer to the Current Medication list given to you today.  *If you need a refill on your cardiac medications before your next appointment, please call your pharmacy*   Lab Work: None ordered   Testing/Procedures: None ordered   Follow-Up: At [facility], you and your health needs are our priority.  As part of our continuing mission to provide you with exceptional heart care, we have created designated Provider Care Teams.  These Care Teams include your primary Cardiologist (physician) and Advanced Practice Providers (APPs -  Physician Assistants and Nurse Practitioners) who all work together to provide you with the care you need, when you need it.  Your next appointment:   1 year(s)  The format for your next appointment:   In Person  Provider:   Will Camnitz, MD    Thank you for choosing [facility]!!   Amariyon Maynes, RN [phone]  Other Instructions          

## 2023-08-18 LAB — LAB REPORT - SCANNED: EGFR: 84.4

## 2023-08-19 ENCOUNTER — Encounter: Payer: Self-pay | Admitting: Internal Medicine

## 2023-08-20 DIAGNOSIS — H1031 Unspecified acute conjunctivitis, right eye: Secondary | ICD-10-CM | POA: Diagnosis not present

## 2023-08-20 DIAGNOSIS — H1045 Other chronic allergic conjunctivitis: Secondary | ICD-10-CM | POA: Diagnosis not present

## 2023-08-20 DIAGNOSIS — H11001 Unspecified pterygium of right eye: Secondary | ICD-10-CM | POA: Diagnosis not present

## 2023-08-24 ENCOUNTER — Other Ambulatory Visit: Payer: Self-pay

## 2023-08-24 DIAGNOSIS — H5203 Hypermetropia, bilateral: Secondary | ICD-10-CM | POA: Diagnosis not present

## 2023-08-24 DIAGNOSIS — H35371 Puckering of macula, right eye: Secondary | ICD-10-CM | POA: Diagnosis not present

## 2023-08-24 DIAGNOSIS — H52223 Regular astigmatism, bilateral: Secondary | ICD-10-CM | POA: Diagnosis not present

## 2023-08-24 DIAGNOSIS — H0100A Unspecified blepharitis right eye, upper and lower eyelids: Secondary | ICD-10-CM | POA: Diagnosis not present

## 2023-08-24 DIAGNOSIS — H524 Presbyopia: Secondary | ICD-10-CM | POA: Diagnosis not present

## 2023-08-24 DIAGNOSIS — H11003 Unspecified pterygium of eye, bilateral: Secondary | ICD-10-CM | POA: Diagnosis not present

## 2023-08-24 DIAGNOSIS — H0100B Unspecified blepharitis left eye, upper and lower eyelids: Secondary | ICD-10-CM | POA: Diagnosis not present

## 2023-08-25 ENCOUNTER — Other Ambulatory Visit (HOSPITAL_COMMUNITY): Payer: Self-pay

## 2023-08-31 ENCOUNTER — Other Ambulatory Visit: Payer: Self-pay

## 2023-09-01 DIAGNOSIS — H11021 Central pterygium of right eye: Secondary | ICD-10-CM | POA: Diagnosis not present

## 2023-09-03 ENCOUNTER — Inpatient Hospital Stay: Payer: PPO | Attending: Adult Health | Admitting: *Deleted

## 2023-09-03 ENCOUNTER — Encounter: Payer: Self-pay | Admitting: *Deleted

## 2023-09-03 DIAGNOSIS — C61 Malignant neoplasm of prostate: Secondary | ICD-10-CM

## 2023-09-03 NOTE — Progress Notes (Signed)
SCP reviewed and completed. Pt will have post-tx PSA labs in January 2025 at [facility] with Dr. Cardell Peach.

## 2023-09-08 ENCOUNTER — Other Ambulatory Visit (HOSPITAL_COMMUNITY): Payer: Self-pay

## 2023-09-10 ENCOUNTER — Other Ambulatory Visit: Payer: Self-pay

## 2023-09-11 ENCOUNTER — Other Ambulatory Visit: Payer: Self-pay

## 2023-09-18 ENCOUNTER — Other Ambulatory Visit (HOSPITAL_COMMUNITY): Payer: Self-pay

## 2023-09-19 ENCOUNTER — Other Ambulatory Visit (HOSPITAL_COMMUNITY): Payer: Self-pay

## 2023-09-21 DIAGNOSIS — E0580 Other thyrotoxicosis without thyrotoxic crisis or storm: Secondary | ICD-10-CM | POA: Diagnosis not present

## 2023-09-25 ENCOUNTER — Other Ambulatory Visit (HOSPITAL_COMMUNITY): Payer: Self-pay

## 2023-10-02 ENCOUNTER — Other Ambulatory Visit (HOSPITAL_COMMUNITY): Payer: Self-pay

## 2023-10-02 ENCOUNTER — Other Ambulatory Visit: Payer: Self-pay

## 2023-10-23 ENCOUNTER — Other Ambulatory Visit (HOSPITAL_COMMUNITY): Payer: Self-pay

## 2023-10-28 DIAGNOSIS — C61 Malignant neoplasm of prostate: Secondary | ICD-10-CM | POA: Diagnosis not present

## 2023-10-31 ENCOUNTER — Other Ambulatory Visit (HOSPITAL_COMMUNITY): Payer: Self-pay

## 2023-11-02 ENCOUNTER — Other Ambulatory Visit: Payer: Self-pay

## 2023-11-03 DIAGNOSIS — R3915 Urgency of urination: Secondary | ICD-10-CM | POA: Diagnosis not present

## 2023-11-03 DIAGNOSIS — C61 Malignant neoplasm of prostate: Secondary | ICD-10-CM | POA: Diagnosis not present

## 2023-11-03 DIAGNOSIS — N200 Calculus of kidney: Secondary | ICD-10-CM | POA: Diagnosis not present

## 2023-11-18 ENCOUNTER — Other Ambulatory Visit: Payer: Self-pay

## 2023-11-19 ENCOUNTER — Other Ambulatory Visit: Payer: Self-pay

## 2023-11-19 ENCOUNTER — Other Ambulatory Visit (HOSPITAL_COMMUNITY): Payer: Self-pay

## 2023-11-23 ENCOUNTER — Other Ambulatory Visit (HOSPITAL_COMMUNITY): Payer: Self-pay

## 2023-11-26 ENCOUNTER — Other Ambulatory Visit (HOSPITAL_COMMUNITY): Payer: Self-pay

## 2023-11-27 ENCOUNTER — Other Ambulatory Visit: Payer: Self-pay

## 2023-11-27 ENCOUNTER — Other Ambulatory Visit (HOSPITAL_COMMUNITY): Payer: Self-pay

## 2023-11-27 MED ORDER — ALPRAZOLAM 0.25 MG PO TABS
0.2500 mg | ORAL_TABLET | Freq: Every evening | ORAL | 3 refills | Status: DC | PRN
  Filled 2023-11-27 – 2023-12-03 (×2): qty 30, 30d supply, fill #0
  Filled 2024-01-02: qty 30, 30d supply, fill #1
  Filled 2024-01-30: qty 30, 30d supply, fill #2
  Filled 2024-03-02: qty 30, 30d supply, fill #3

## 2023-12-03 ENCOUNTER — Other Ambulatory Visit: Payer: Self-pay

## 2023-12-03 ENCOUNTER — Other Ambulatory Visit (HOSPITAL_COMMUNITY): Payer: Self-pay

## 2023-12-10 ENCOUNTER — Other Ambulatory Visit (HOSPITAL_COMMUNITY): Payer: Self-pay

## 2023-12-17 ENCOUNTER — Other Ambulatory Visit (HOSPITAL_COMMUNITY): Payer: Self-pay

## 2023-12-17 ENCOUNTER — Other Ambulatory Visit: Payer: Self-pay

## 2023-12-17 ENCOUNTER — Other Ambulatory Visit (HOSPITAL_BASED_OUTPATIENT_CLINIC_OR_DEPARTMENT_OTHER): Payer: Self-pay

## 2023-12-17 MED ORDER — MIRABEGRON ER 25 MG PO TB24
25.0000 mg | ORAL_TABLET | Freq: Every day | ORAL | 2 refills | Status: DC
  Filled 2023-12-17: qty 90, 90d supply, fill #0
  Filled 2024-03-12: qty 90, 90d supply, fill #1
  Filled 2024-06-10: qty 90, 90d supply, fill #2

## 2023-12-21 ENCOUNTER — Other Ambulatory Visit (HOSPITAL_COMMUNITY): Payer: Self-pay

## 2023-12-22 ENCOUNTER — Other Ambulatory Visit: Payer: Self-pay

## 2024-01-02 ENCOUNTER — Other Ambulatory Visit (HOSPITAL_COMMUNITY): Payer: Self-pay

## 2024-01-05 ENCOUNTER — Other Ambulatory Visit: Payer: Self-pay | Admitting: Cardiology

## 2024-01-07 ENCOUNTER — Other Ambulatory Visit (HOSPITAL_COMMUNITY): Payer: Self-pay

## 2024-01-07 DIAGNOSIS — T462X5A Adverse effect of other antidysrhythmic drugs, initial encounter: Secondary | ICD-10-CM | POA: Diagnosis not present

## 2024-01-07 DIAGNOSIS — I77819 Aortic ectasia, unspecified site: Secondary | ICD-10-CM | POA: Diagnosis not present

## 2024-01-07 DIAGNOSIS — N200 Calculus of kidney: Secondary | ICD-10-CM | POA: Diagnosis not present

## 2024-01-07 DIAGNOSIS — K921 Melena: Secondary | ICD-10-CM | POA: Diagnosis not present

## 2024-01-07 DIAGNOSIS — C61 Malignant neoplasm of prostate: Secondary | ICD-10-CM | POA: Diagnosis not present

## 2024-01-07 DIAGNOSIS — I5020 Unspecified systolic (congestive) heart failure: Secondary | ICD-10-CM | POA: Diagnosis not present

## 2024-01-07 DIAGNOSIS — B351 Tinea unguium: Secondary | ICD-10-CM | POA: Diagnosis not present

## 2024-01-07 DIAGNOSIS — I700 Atherosclerosis of aorta: Secondary | ICD-10-CM | POA: Diagnosis not present

## 2024-01-07 DIAGNOSIS — E0580 Other thyrotoxicosis without thyrotoxic crisis or storm: Secondary | ICD-10-CM | POA: Diagnosis not present

## 2024-01-07 DIAGNOSIS — E785 Hyperlipidemia, unspecified: Secondary | ICD-10-CM | POA: Diagnosis not present

## 2024-01-07 DIAGNOSIS — I252 Old myocardial infarction: Secondary | ICD-10-CM | POA: Diagnosis not present

## 2024-01-07 DIAGNOSIS — I4892 Unspecified atrial flutter: Secondary | ICD-10-CM | POA: Diagnosis not present

## 2024-01-07 MED ORDER — ATORVASTATIN CALCIUM 20 MG PO TABS
20.0000 mg | ORAL_TABLET | Freq: Every day | ORAL | 0 refills | Status: DC
  Filled 2024-01-07: qty 30, 30d supply, fill #0

## 2024-01-14 ENCOUNTER — Other Ambulatory Visit (HOSPITAL_COMMUNITY): Payer: Self-pay

## 2024-01-16 ENCOUNTER — Other Ambulatory Visit (HOSPITAL_COMMUNITY): Payer: Self-pay

## 2024-01-18 ENCOUNTER — Other Ambulatory Visit (HOSPITAL_COMMUNITY): Payer: Self-pay

## 2024-02-01 ENCOUNTER — Other Ambulatory Visit: Payer: Self-pay

## 2024-02-01 ENCOUNTER — Other Ambulatory Visit (HOSPITAL_COMMUNITY): Payer: Self-pay

## 2024-02-10 ENCOUNTER — Other Ambulatory Visit: Payer: Self-pay | Admitting: Cardiology

## 2024-02-10 DIAGNOSIS — I4891 Unspecified atrial fibrillation: Secondary | ICD-10-CM

## 2024-02-11 MED ORDER — METOPROLOL SUCCINATE ER 50 MG PO TB24
50.0000 mg | ORAL_TABLET | Freq: Every day | ORAL | 1 refills | Status: DC
  Filled 2024-02-11: qty 90, 90d supply, fill #0
  Filled 2024-05-11: qty 90, 90d supply, fill #1

## 2024-02-11 MED ORDER — ATORVASTATIN CALCIUM 20 MG PO TABS
20.0000 mg | ORAL_TABLET | Freq: Every day | ORAL | 0 refills | Status: DC
  Filled 2024-02-11: qty 15, 15d supply, fill #0

## 2024-02-12 ENCOUNTER — Other Ambulatory Visit: Payer: Self-pay

## 2024-02-12 ENCOUNTER — Other Ambulatory Visit (HOSPITAL_COMMUNITY): Payer: Self-pay

## 2024-02-12 MED ORDER — APIXABAN 5 MG PO TABS
5.0000 mg | ORAL_TABLET | Freq: Two times a day (BID) | ORAL | 1 refills | Status: DC
  Filled 2024-02-12: qty 60, 30d supply, fill #0
  Filled 2024-03-13: qty 60, 30d supply, fill #1
  Filled 2024-04-22: qty 60, 30d supply, fill #2
  Filled 2024-05-21: qty 60, 30d supply, fill #3
  Filled 2024-06-20: qty 60, 30d supply, fill #4
  Filled 2024-07-20: qty 60, 30d supply, fill #5

## 2024-02-12 NOTE — Telephone Encounter (Signed)
 Prescription refill request for Eliquis  received. Indication: a fib Last office visit: 08/17/23 Scr: 0.9 epic 07/06/23 Age: 68 Weight: 75kg

## 2024-02-17 ENCOUNTER — Other Ambulatory Visit (HOSPITAL_COMMUNITY): Payer: Self-pay

## 2024-02-22 ENCOUNTER — Telehealth: Payer: Self-pay

## 2024-02-22 DIAGNOSIS — K920 Hematemesis: Secondary | ICD-10-CM | POA: Diagnosis not present

## 2024-02-22 DIAGNOSIS — K625 Hemorrhage of anus and rectum: Secondary | ICD-10-CM | POA: Diagnosis not present

## 2024-02-22 DIAGNOSIS — Z1211 Encounter for screening for malignant neoplasm of colon: Secondary | ICD-10-CM | POA: Diagnosis not present

## 2024-02-22 DIAGNOSIS — I482 Chronic atrial fibrillation, unspecified: Secondary | ICD-10-CM | POA: Diagnosis not present

## 2024-02-22 NOTE — Telephone Encounter (Signed)
   Pre-operative Risk Assessment    Patient Name: Arthur Barrett  DOB: 10/11/1956 MRN: [id number]   Date of last office visit: 08/17/23 WILL CAMNITZ, MD Date of next office visit: NONE   Request for Surgical Clearance    Procedure:   COLONOSCOPY  Date of Surgery:  Clearance 03/10/24                                Surgeon:  DR Nickey Barn Surgeon's Group or Practice Name:  [facility], [place] Phone number:  [phone] Fax number:  [phone]   Type of Clearance Requested:   - Medical  - Pharmacy:  Hold Apixaban  (Eliquis )     Type of Anesthesia:   PROPOFOL    Additional requests/questions:    SignedCollin Deal   02/22/2024, 5:13 PM

## 2024-02-23 ENCOUNTER — Other Ambulatory Visit: Payer: Self-pay

## 2024-02-23 ENCOUNTER — Telehealth: Payer: Self-pay

## 2024-02-23 NOTE — Telephone Encounter (Signed)
 Patient is scheduled for 03/03/2024. Med rec and consent has been done.

## 2024-02-23 NOTE — Telephone Encounter (Signed)
  Patient Consent for Virtual Visit        Arthur Barrett has provided verbal consent on 02/23/2024 for a virtual visit (video or telephone).  Patient is scheduled for 03/03/2024. Med rec and consent has been done. Call patient at [phone].    CONSENT FOR VIRTUAL VISIT FOR:  Arthur Barrett  By participating in this virtual visit I agree to the following:  I hereby voluntarily request, consent and authorize [facility] and its employed or contracted physicians, physician assistants, nurse practitioners or other licensed health care professionals (the Practitioner), to provide me with telemedicine health care services (the "Services") as deemed necessary by the treating Practitioner. I acknowledge and consent to receive the Services by the Practitioner via telemedicine. I understand that the telemedicine visit will involve communicating with the Practitioner through live audiovisual communication technology and the disclosure of certain medical information by electronic transmission. I acknowledge that I have been given the opportunity to request an in-person assessment or other available alternative prior to the telemedicine visit and am voluntarily participating in the telemedicine visit.  I understand that I have the right to withhold or withdraw my consent to the use of telemedicine in the course of my care at any time, without affecting my right to future care or treatment, and that the Practitioner or I may terminate the telemedicine visit at any time. I understand that I have the right to inspect all information obtained and/or recorded in the course of the telemedicine visit and may receive copies of available information for a reasonable fee.  I understand that some of the potential risks of receiving the Services via telemedicine include:  Delay or interruption in medical evaluation due to technological equipment failure or disruption; Information transmitted may not  be sufficient (e.g. poor resolution of images) to allow for appropriate medical decision making by the Practitioner; and/or  In rare instances, security protocols could fail, causing a breach of personal health information.  Furthermore, I acknowledge that it is my responsibility to provide information about my medical history, conditions and care that is complete and accurate to the best of my ability. I acknowledge that Practitioner's advice, recommendations, and/or decision may be based on factors not within their control, such as incomplete or inaccurate data provided by me or distortions of diagnostic images or specimens that may result from electronic transmissions. I understand that the practice of medicine is not an exact science and that Practitioner makes no warranties or guarantees regarding treatment outcomes. I acknowledge that a copy of this consent can be made available to me via my patient portal [name] MyChart), or I can request a printed copy by calling the office of [facility].    I understand that my insurance will be billed for this visit.   I have read or had this consent read to me. I understand the contents of this consent, which adequately explains the benefits and risks of the Services being provided via telemedicine.  I have been provided ample opportunity to ask questions regarding this consent and the Services and have had my questions answered to my satisfaction. I give my informed consent for the services to be provided through the use of telemedicine in my medical care

## 2024-02-23 NOTE — Telephone Encounter (Signed)
 Primary Cardiologist:Kardie Tobb, DO   Preoperative team, please contact this patient and set up a phone call appointment for further preoperative risk assessment. Please obtain consent and complete medication review. Thank you for your help.   Per office protocol, patient can hold Eliquis  for 2 days prior to procedure.   Patient will not need bridging with Lovenox (enoxaparin) around procedure.  I also confirmed the patient resides in the state of [place] . As per [facility] Medical Board telemedicine laws, the patient must reside in the state in which the provider is licensed.   Gerldine Koch, NP-C  02/23/2024, 3:27 PM [address], [address] [place], [place] [address] Office [phone] Fax [phone]

## 2024-02-23 NOTE — Telephone Encounter (Signed)
 Patient with diagnosis of atrial fibrillation on Eliquis  for anticoagulation.    Procedure:   COLONOSCOPY   Date of Surgery:  Clearance 03/10/24     CHA2DS2-VASc Score = 3   This indicates a 3.2% annual risk of stroke. The patient's score is based upon: CHF History: 1 HTN History: 0 Diabetes History: 0 Stroke History: 0 Vascular Disease History: 1 Age Score: 1 Gender Score: 0  CrCl 85 Platelet count 176  Patient has not had an Afib/aflutter ablation within the last 3 months or DCCV within the last 30 days  Per office protocol, patient can hold Eliquis  for 2 days prior to procedure.   Patient will not need bridging with Lovenox (enoxaparin) around procedure.  **This guidance is not considered finalized until pre-operative APP has relayed final recommendations.**

## 2024-02-28 IMAGING — DX DG ABDOMEN 1V
2 series · 2 of 2 positions shown · non-contrast
Comparison: None.

CLINICAL DATA: Urolithiasis

EXAM:
ABDOMEN - 1 VIEW

[abdomen kub (1 of 2)]
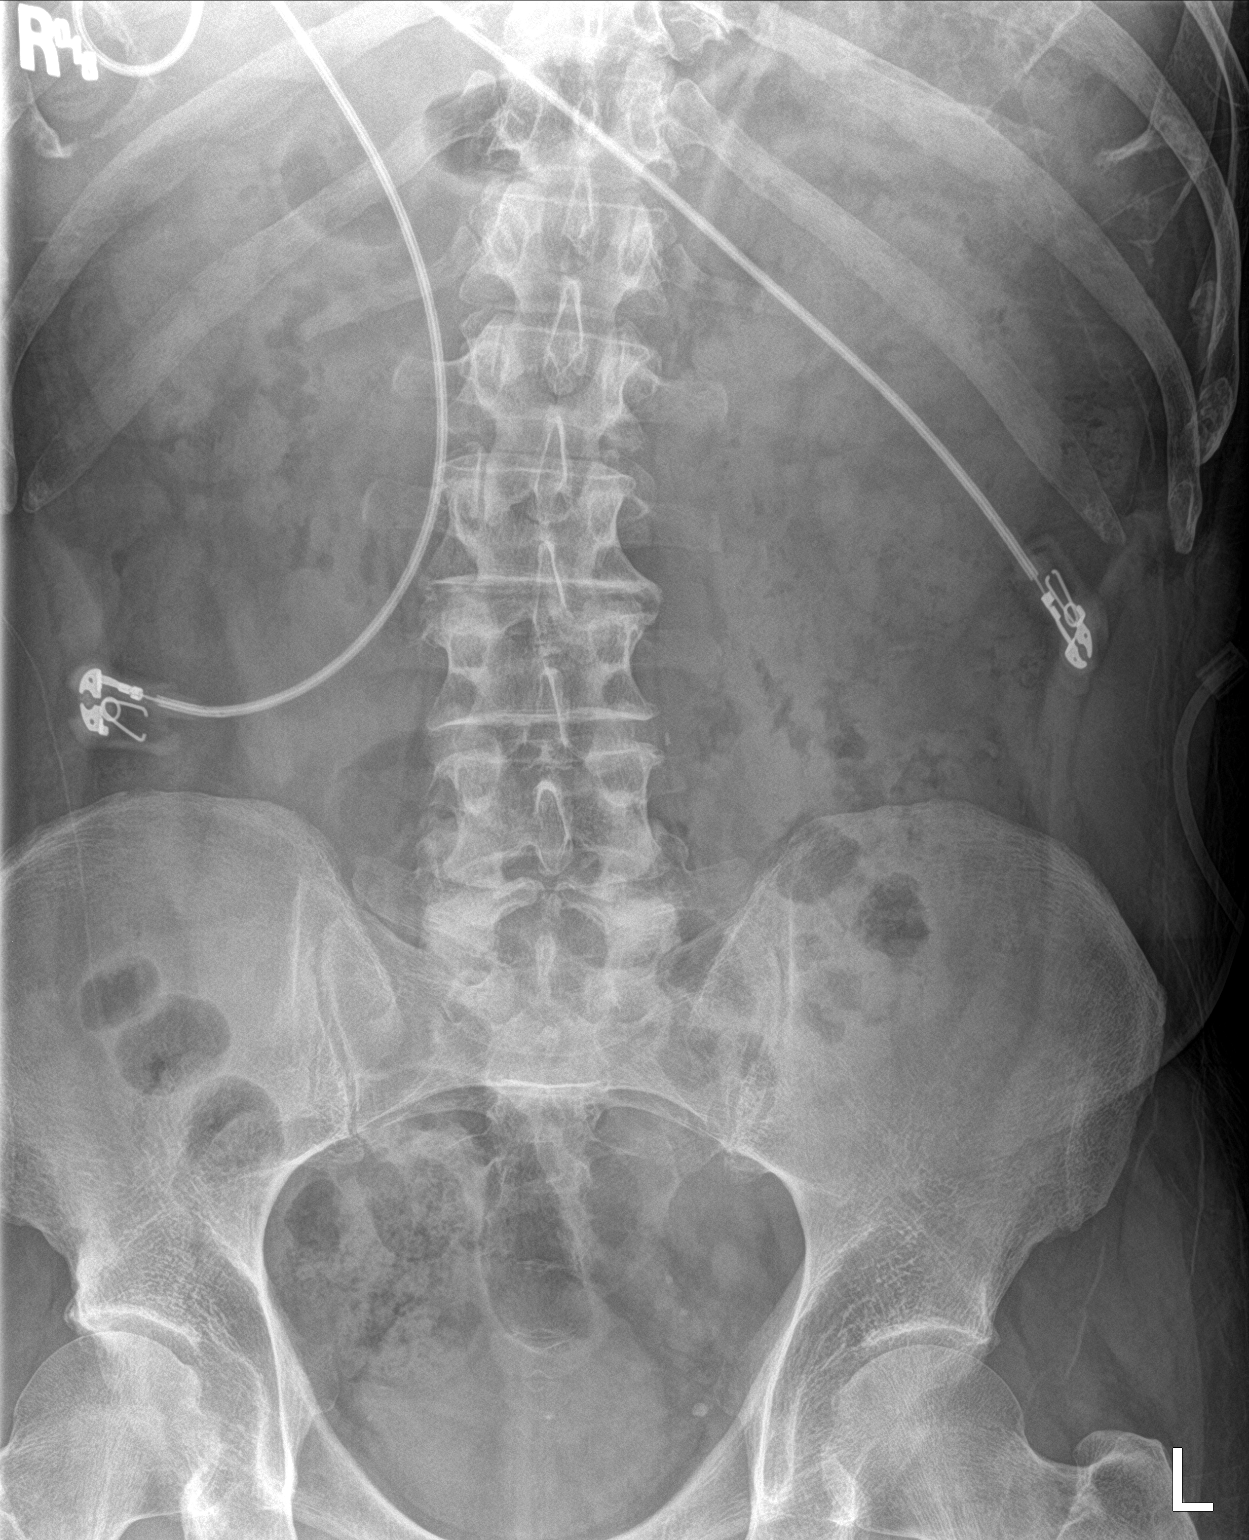

[abdomen kub (2 of 2)]
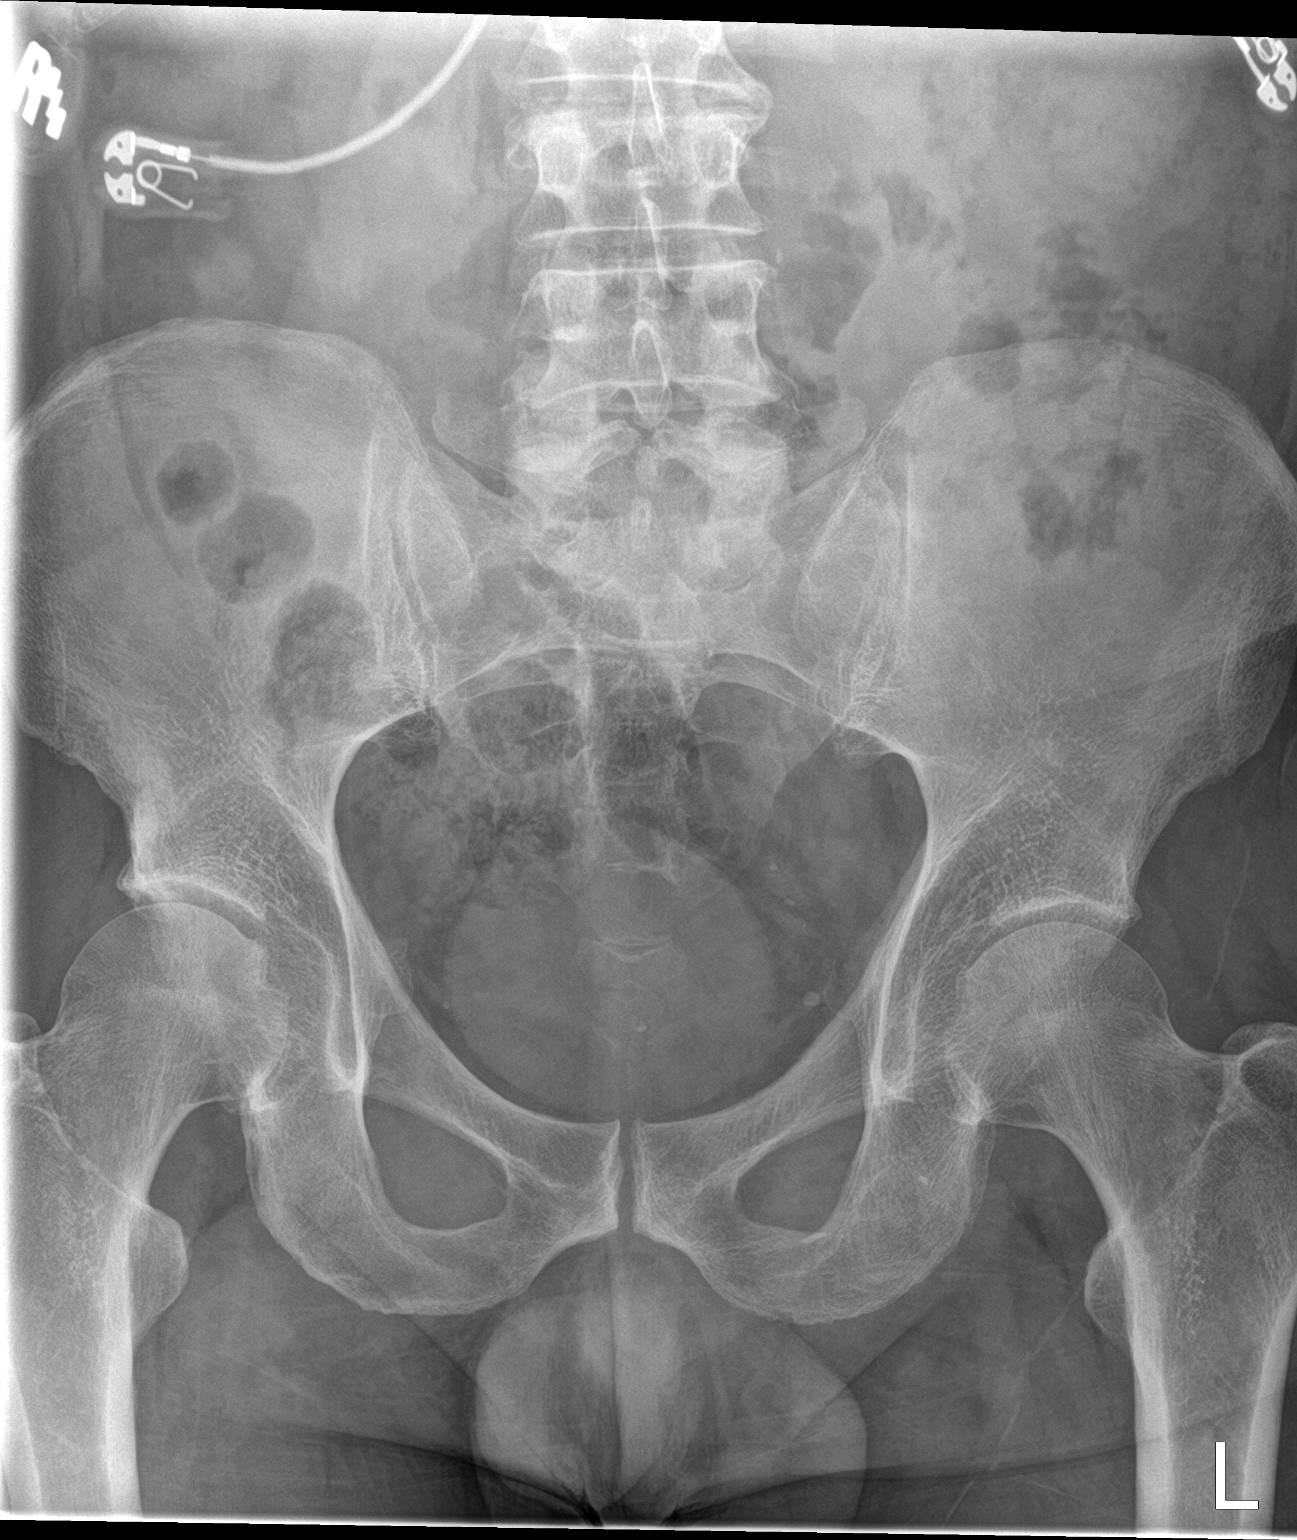

[2 of 2 positions shown; findings below may reference images not displayed]

FINDINGS: The bowel gas pattern is normal. No radio-opaque calculi or other
significant radiographic abnormality are seen. Calcifications in the
pelvis, likely due to phleboliths.
IMPRESSION: No visible nephrolithiasis or urolithiasis, although overlying bowel
gas limits evaluation.

## 2024-03-02 ENCOUNTER — Other Ambulatory Visit: Payer: Self-pay | Admitting: Cardiology

## 2024-03-03 ENCOUNTER — Other Ambulatory Visit: Payer: Self-pay

## 2024-03-03 ENCOUNTER — Ambulatory Visit: Attending: Cardiovascular Disease | Admitting: Emergency Medicine

## 2024-03-03 ENCOUNTER — Other Ambulatory Visit (HOSPITAL_COMMUNITY): Payer: Self-pay

## 2024-03-03 DIAGNOSIS — Z01810 Encounter for preprocedural cardiovascular examination: Secondary | ICD-10-CM

## 2024-03-03 NOTE — Progress Notes (Signed)
 Virtual Visit via Telephone Note   Because of [name] co-morbid illnesses, he is at least at moderate risk for complications without adequate follow up.  This format is felt to be most appropriate for this patient at this time.  Due to technical limitations with video connection (technology), today's appointment will be conducted as an audio only telehealth visit, and [name] verbally agreed to proceed in this manner.   All issues noted in this document were discussed and addressed.  No physical exam could be performed with this format.  Evaluation Performed:  Preoperative cardiovascular risk assessment _____________   Date:  03/03/2024   Patient ID:  Arthur Barrett, DOB [birth year], MRN [id number] Patient Location:  Home Provider location:   Office  Primary Care Provider:  Margarete Sharps, MD Primary Cardiologist:  Kardie Tobb, DO  Chief Complaint / Patient Profile   68 y.o. y/o male with a h/o atrial fibrillation/flutter, chronic systolic heart failure, hypothyroidism, hyperlipidemia who is pending colonoscopy on 03/10/2024 with [facility] by Dr. Nickey Barn and presents today for telephonic preoperative cardiovascular risk assessment.  History of Present Illness    Arthur Barrett is a 68 y.o. male who presents via audio/video conferencing for a telehealth visit today.  Pt was last seen in cardiology clinic on 08/17/2023 by Dr. Lawana Pray.  At that time CHUCKIE VILLAPUDUA was doing well.  The patient is now pending procedure as outlined above. Since his last visit, he denies chest pain, shortness of breath, lower extremity edema, fatigue, palpitations, melena, hematuria, hemoptysis, diaphoresis, weakness, presyncope, syncope, orthopnea, and PND.  Today patient is doing well overall.  He is without any acute cardiovascular concerns or complaints.  He is very active and stretches daily for at least 30 minutes and then walks/exercises every day.  He gets at  least 2 to 4 miles in a day.  He monitors his heart rate and rhythm on his Apple Watch.  He denies any paroxysms.  His heart rate averages 120s when he is exercising and 50s when he sleeps.  He is without any anginal symptoms at this time.  Doing well overall and can clearly complete greater than 4 METS.  Past Medical History    Past Medical History:  Diagnosis Date   History of abnormal  myocardial perfusion imaging scan 12/26/2021   mild systolic dysfunction ef 46 % intermediate risk study see further results epic report   History of atrial fibrillation    s/p ablation 04-29-2022 with dr Lawana Pray   History of COVID-19 03/2021   mild all symptoms resolved   History of kidney stones    yrs ago per pt   History of skull fracture    13 yrs ago tree fell on pt no surgery done no residual treated at [facility]   History of transesophageal echocardiography (TEE) 12/06/2021   successful cardioversion done, ef 50 to 55 %   Intermittent palpitations    Prostate cancer (HCC)    Wears glasses    Past Surgical History:  Procedure Laterality Date   APPENDECTOMY     61 yrs agp per pt at [facility]   ATRIAL FIBRILLATION ABLATION N/A 04/29/2022   Procedure: ATRIAL FIBRILLATION ABLATION;  Surgeon: Lei Pump, MD;  Location: [facility] INVASIVE CV LAB;  Service: Cardiovascular;  Laterality: N/A;   CARDIOVERSION N/A 12/06/2021   Procedure: CARDIOVERSION;  Surgeon: Hazle Lites, MD;  Location: [facility] ENDOSCOPY;  Service: Cardiovascular;  Laterality: N/A;   colonscopy  2018 or 2019 normal per pt   CYSTOSCOPY  07/06/2023   Procedure: CYSTOSCOPY FLEXIBLE;  Surgeon: Lahoma Pigg, MD;  Location: [facility];  Service: Urology;;  No seeds detected in bladder per Dr. Freddi Jaeger   CYSTOSCOPY/URETEROSCOPY/HOLMIUM LASER/STENT PLACEMENT Right 02/03/2022   Procedure: CYSTOSCOPY/RETROGRADE/URETEROSCOPY/HOLMIUM LASER/STENT PLACEMENT;  Surgeon: Lahoma Pigg, MD;  Location: [facility];  Service: Urology;  Laterality: Right;   CYSTOSCOPY/URETEROSCOPY/HOLMIUM LASER/STENT PLACEMENT Right 02/17/2022   Procedure: CYSTOSCOPY/ RETROGRADE/URETEROSCOPY/STENT EXCHANGED;  Surgeon: Lahoma Pigg, MD;  Location: [facility];  Service: Urology;  Laterality: Right;   LUMBAR LAMINECTOMY/DECOMPRESSION MICRODISCECTOMY Left 01/31/2015   Procedure: MICRO LUMBAR DECOMPRESSION LUMBAR FIVE TO SACRAL ONE ON LEFT;  Surgeon: Orvan Blanch, MD;  Location: [facility]S;  Service: Orthopedics;  Laterality: Left;   PROSTATE BIOPSY N/A 02/03/2022   Procedure: BIOPSY TRANSRECTAL ULTRASONIC PROSTATE (TUBP);  Surgeon: Lahoma Pigg, MD;  Location: [facility];  Service: Urology;  Laterality: N/A;   RADIOACTIVE SEED IMPLANT N/A 07/06/2023   Procedure: RADIOACTIVE SEED IMPLANT/BRACHYTHERAPY IMPLANT;  Surgeon: Lahoma Pigg, MD;  Location[facility];  Service: Urology;  Laterality: N/A;  90 MINUTES NEEDED FOR CASE   SPACE OAR INSTILLATION N/A 07/06/2023   Procedure: SPACE OAR INSTILLATION;  Surgeon: Lahoma Pigg, MD;  Location: [facility];  Service: Urology;  Laterality: N/A;   TEE WITHOUT CARDIOVERSION N/A 12/06/2021   Procedure: TRANSESOPHAGEAL ECHOCARDIOGRAM (TEE);  Surgeon: Hazle Lites, MD;  Location: [facility] ENDOSCOPY;  Service: Cardiovascular;  Laterality: N/A;    Allergies  No Known Allergies  Home Medications    Prior to Admission medications   Medication Sig Start Date End Date Taking? Authorizing Provider  ALPRAZolam  (XANAX ) 0.25 MG tablet Take 1 tablet (0.25 mg total) by mouth at bedtime as needed. 11/27/23     apixaban  (ELIQUIS ) 5 MG TABS tablet Take 1 tablet (5 mg total) by mouth 2 (two) times daily. 02/12/24   Camnitz, Babetta Lesch, MD  Ascorbic Acid (VITAMIN C PO) Take 1 tablet by mouth daily.    [provider]  atorvastatin  (LIPITOR) 20 MG tablet Take 1 tablet (20 mg total) by mouth at bedtime. 02/11/24   Tobb, Kardie,  DO  diphenhydrAMINE  (BENADRYL ) 25 mg capsule Take 25 mg by mouth at bedtime.    [provider]  docusate sodium  (COLACE) 100 MG capsule Take 1 capsule (100 mg total) by mouth daily as needed Patient not taking: Reported on 09/03/2023 07/06/23   Lahoma Pigg, MD  fluticasone [facility]) 50 MCG/ACT nasal spray Place 1 spray into both nostrils at bedtime.    [provider]  GLUCOSAMINE-CHONDROITIN PO Take 2 tablets by mouth daily. Patient not taking: Reported on 09/03/2023    [provider]  HYDROcodone -acetaminophen  (NORCO/VICODIN) 5-325 MG tablet Take 1/2 - 1 tablet by mouth every 6 (six) hours as needed. Patient not taking: Reported on 09/03/2023 08/03/23   Bruning, Ashlyn, [name]  metoprolol  succinate (TOPROL -XL) 50 MG 24 hr tablet Take 1 tablet (50 mg total) by mouth at bedtime. Take with or immediately following a meal. 02/11/24   Camnitz, Babetta Lesch, MD  mirabegron  ER (MYRBETRIQ ) 25 MG TB24 tablet Take 1 tablet (25 mg total) by mouth daily. 12/17/23     Multiple Vitamins-Minerals (MULTIVITAMIN WITH MINERALS) tablet Take 1 tablet by mouth daily. men's    [provider]  Omega-3 Fatty Acids (FISH OIL) 1000 MG CAPS Take 2,000 mg by mouth daily.  [provider]  tamsulosin  (FLOMAX ) 0.4 MG CAPS capsule Take 1 capsule (0.4 mg total) by mouth 2 (two) times daily. 08/07/23       Physical Exam    Vital Signs:  BUNNIE GILLITZER does not have vital signs available for review today.  Given telephonic nature of communication, physical exam is limited. AAOx3. NAD. Normal affect.  Speech and respirations are unlabored.  Accessory Clinical Findings    None  Assessment & Plan    1.  Preoperative Cardiovascular Risk Assessment: According to the Revised Cardiac Risk Index (RCRI), his Perioperative Risk of Major Cardiac Event is (%): 0.4. His Functional Capacity in METs is: 6.61 according to the Duke Activity Status Index (DASI). Therefore, based on  ACC/AHA guidelines, patient would be at acceptable risk for the planned procedure without further cardiovascular testing.  The patient was advised that if he develops new symptoms prior to surgery to contact our office to arrange for a follow-up visit, and he verbalized understanding.  Per office protocol, patient can hold Eliquis  for 2 days prior to procedure.   Patient will not need bridging with Lovenox (enoxaparin) around procedure.  A copy of this note will be routed to requesting surgeon.  Time:   Today, I have spent 10 minutes with the patient with telehealth technology discussing medical history, symptoms, and management plan.     Ava Boatman, NP  03/03/2024, 9:15 AM

## 2024-03-07 ENCOUNTER — Other Ambulatory Visit (HOSPITAL_COMMUNITY): Payer: Self-pay

## 2024-03-10 DIAGNOSIS — K635 Polyp of colon: Secondary | ICD-10-CM | POA: Diagnosis not present

## 2024-03-10 DIAGNOSIS — K5730 Diverticulosis of large intestine without perforation or abscess without bleeding: Secondary | ICD-10-CM | POA: Diagnosis not present

## 2024-03-10 DIAGNOSIS — D122 Benign neoplasm of ascending colon: Secondary | ICD-10-CM | POA: Diagnosis not present

## 2024-03-10 DIAGNOSIS — Z1211 Encounter for screening for malignant neoplasm of colon: Secondary | ICD-10-CM | POA: Diagnosis not present

## 2024-03-12 ENCOUNTER — Other Ambulatory Visit: Payer: Self-pay | Admitting: Cardiology

## 2024-03-12 ENCOUNTER — Other Ambulatory Visit (HOSPITAL_COMMUNITY): Payer: Self-pay

## 2024-03-13 ENCOUNTER — Other Ambulatory Visit: Payer: Self-pay

## 2024-03-15 ENCOUNTER — Other Ambulatory Visit: Payer: Self-pay

## 2024-03-16 ENCOUNTER — Other Ambulatory Visit: Payer: Self-pay | Admitting: Cardiology

## 2024-03-16 ENCOUNTER — Other Ambulatory Visit (HOSPITAL_COMMUNITY): Payer: Self-pay

## 2024-03-16 ENCOUNTER — Other Ambulatory Visit: Payer: Self-pay

## 2024-03-17 ENCOUNTER — Other Ambulatory Visit: Payer: Self-pay

## 2024-03-17 ENCOUNTER — Other Ambulatory Visit (HOSPITAL_COMMUNITY): Payer: Self-pay

## 2024-03-17 MED ORDER — ATORVASTATIN CALCIUM 20 MG PO TABS
20.0000 mg | ORAL_TABLET | Freq: Every day | ORAL | 0 refills | Status: DC
  Filled 2024-03-17: qty 15, 15d supply, fill #0

## 2024-04-07 ENCOUNTER — Other Ambulatory Visit (HOSPITAL_COMMUNITY): Payer: Self-pay

## 2024-04-08 ENCOUNTER — Other Ambulatory Visit (HOSPITAL_COMMUNITY): Payer: Self-pay

## 2024-04-08 MED ORDER — ALPRAZOLAM 0.25 MG PO TABS
0.2500 mg | ORAL_TABLET | Freq: Every evening | ORAL | 0 refills | Status: DC | PRN
  Filled 2024-04-08: qty 30, 30d supply, fill #0

## 2024-04-22 ENCOUNTER — Other Ambulatory Visit: Payer: Self-pay | Admitting: Cardiology

## 2024-04-23 ENCOUNTER — Other Ambulatory Visit (HOSPITAL_COMMUNITY): Payer: Self-pay

## 2024-04-25 ENCOUNTER — Other Ambulatory Visit: Payer: Self-pay

## 2024-04-26 ENCOUNTER — Other Ambulatory Visit (HOSPITAL_COMMUNITY): Payer: Self-pay

## 2024-04-26 ENCOUNTER — Other Ambulatory Visit: Payer: Self-pay

## 2024-04-26 DIAGNOSIS — C61 Malignant neoplasm of prostate: Secondary | ICD-10-CM | POA: Diagnosis not present

## 2024-04-26 MED ORDER — ATORVASTATIN CALCIUM 20 MG PO TABS
20.0000 mg | ORAL_TABLET | Freq: Every day | ORAL | 0 refills | Status: DC
  Filled 2024-04-26: qty 15, 15d supply, fill #0

## 2024-05-03 DIAGNOSIS — C61 Malignant neoplasm of prostate: Secondary | ICD-10-CM | POA: Diagnosis not present

## 2024-05-03 DIAGNOSIS — N5201 Erectile dysfunction due to arterial insufficiency: Secondary | ICD-10-CM | POA: Diagnosis not present

## 2024-05-03 DIAGNOSIS — R3915 Urgency of urination: Secondary | ICD-10-CM | POA: Diagnosis not present

## 2024-05-04 ENCOUNTER — Other Ambulatory Visit: Payer: Self-pay | Admitting: Cardiology

## 2024-05-11 ENCOUNTER — Other Ambulatory Visit (HOSPITAL_COMMUNITY): Payer: Self-pay

## 2024-05-11 ENCOUNTER — Other Ambulatory Visit: Payer: Self-pay

## 2024-05-20 ENCOUNTER — Encounter: Payer: Self-pay | Admitting: Cardiology

## 2024-05-20 ENCOUNTER — Other Ambulatory Visit (HOSPITAL_COMMUNITY): Payer: Self-pay

## 2024-05-21 ENCOUNTER — Other Ambulatory Visit (HOSPITAL_COMMUNITY): Payer: Self-pay

## 2024-05-23 ENCOUNTER — Other Ambulatory Visit (HOSPITAL_COMMUNITY): Payer: Self-pay

## 2024-05-23 ENCOUNTER — Other Ambulatory Visit: Payer: Self-pay

## 2024-05-23 MED ORDER — ATORVASTATIN CALCIUM 20 MG PO TABS
20.0000 mg | ORAL_TABLET | Freq: Every day | ORAL | 0 refills | Status: DC
  Filled 2024-05-23: qty 30, 30d supply, fill #0

## 2024-05-23 MED ORDER — ALPRAZOLAM 0.25 MG PO TABS
0.2500 mg | ORAL_TABLET | Freq: Every evening | ORAL | 0 refills | Status: DC | PRN
  Filled 2024-05-23: qty 30, 30d supply, fill #0

## 2024-05-24 ENCOUNTER — Other Ambulatory Visit: Payer: Self-pay

## 2024-06-10 ENCOUNTER — Other Ambulatory Visit (HOSPITAL_COMMUNITY): Payer: Self-pay

## 2024-06-15 ENCOUNTER — Other Ambulatory Visit: Payer: Self-pay | Admitting: Cardiology

## 2024-06-16 ENCOUNTER — Other Ambulatory Visit (HOSPITAL_COMMUNITY): Payer: Self-pay

## 2024-06-17 ENCOUNTER — Other Ambulatory Visit (HOSPITAL_COMMUNITY): Payer: Self-pay

## 2024-06-20 ENCOUNTER — Other Ambulatory Visit: Payer: Self-pay

## 2024-06-21 ENCOUNTER — Other Ambulatory Visit (HOSPITAL_COMMUNITY): Payer: Self-pay

## 2024-06-21 ENCOUNTER — Other Ambulatory Visit: Payer: Self-pay

## 2024-06-21 MED ORDER — ATORVASTATIN CALCIUM 20 MG PO TABS
20.0000 mg | ORAL_TABLET | Freq: Every day | ORAL | 0 refills | Status: DC
  Filled 2024-06-21: qty 30, 30d supply, fill #0

## 2024-06-21 NOTE — Progress Notes (Signed)
 Refill sent to preferred pharmacy.

## 2024-06-25 ENCOUNTER — Other Ambulatory Visit (HOSPITAL_COMMUNITY): Payer: Self-pay

## 2024-06-27 ENCOUNTER — Other Ambulatory Visit (HOSPITAL_COMMUNITY): Payer: Self-pay

## 2024-06-27 ENCOUNTER — Other Ambulatory Visit (HOSPITAL_BASED_OUTPATIENT_CLINIC_OR_DEPARTMENT_OTHER): Payer: Self-pay

## 2024-06-27 DIAGNOSIS — M25571 Pain in right ankle and joints of right foot: Secondary | ICD-10-CM | POA: Diagnosis not present

## 2024-06-27 DIAGNOSIS — M21621 Bunionette of right foot: Secondary | ICD-10-CM | POA: Diagnosis not present

## 2024-06-27 DIAGNOSIS — M205X1 Other deformities of toe(s) (acquired), right foot: Secondary | ICD-10-CM | POA: Diagnosis not present

## 2024-06-27 DIAGNOSIS — M19072 Primary osteoarthritis, left ankle and foot: Secondary | ICD-10-CM | POA: Diagnosis not present

## 2024-06-27 MED ORDER — ALPRAZOLAM 0.25 MG PO TABS
0.2500 mg | ORAL_TABLET | Freq: Every evening | ORAL | 0 refills | Status: DC | PRN
  Filled 2024-06-27: qty 30, 30d supply, fill #0

## 2024-07-15 ENCOUNTER — Other Ambulatory Visit (HOSPITAL_COMMUNITY): Payer: Self-pay

## 2024-07-15 ENCOUNTER — Other Ambulatory Visit: Payer: Self-pay

## 2024-07-15 ENCOUNTER — Other Ambulatory Visit: Payer: Self-pay | Admitting: Cardiology

## 2024-07-15 MED ORDER — ATORVASTATIN CALCIUM 20 MG PO TABS
20.0000 mg | ORAL_TABLET | Freq: Every day | ORAL | 0 refills | Status: DC
  Filled 2024-07-15: qty 30, 30d supply, fill #0

## 2024-07-20 ENCOUNTER — Other Ambulatory Visit (HOSPITAL_COMMUNITY): Payer: Self-pay

## 2024-07-22 ENCOUNTER — Other Ambulatory Visit: Payer: Self-pay | Admitting: Cardiology

## 2024-07-23 MED ORDER — ATORVASTATIN CALCIUM 20 MG PO TABS
20.0000 mg | ORAL_TABLET | Freq: Every day | ORAL | 0 refills | Status: DC
  Filled 2024-07-23 – 2024-08-13 (×2): qty 30, 30d supply, fill #0

## 2024-07-24 ENCOUNTER — Other Ambulatory Visit (HOSPITAL_COMMUNITY): Payer: Self-pay

## 2024-07-26 ENCOUNTER — Other Ambulatory Visit (HOSPITAL_BASED_OUTPATIENT_CLINIC_OR_DEPARTMENT_OTHER): Payer: Self-pay

## 2024-07-26 ENCOUNTER — Other Ambulatory Visit (HOSPITAL_COMMUNITY): Payer: Self-pay

## 2024-07-26 ENCOUNTER — Other Ambulatory Visit: Payer: Self-pay

## 2024-07-26 DIAGNOSIS — N3941 Urge incontinence: Secondary | ICD-10-CM | POA: Diagnosis not present

## 2024-07-26 DIAGNOSIS — E0580 Other thyrotoxicosis without thyrotoxic crisis or storm: Secondary | ICD-10-CM | POA: Diagnosis not present

## 2024-07-26 DIAGNOSIS — R82998 Other abnormal findings in urine: Secondary | ICD-10-CM | POA: Diagnosis not present

## 2024-07-26 DIAGNOSIS — Z1331 Encounter for screening for depression: Secondary | ICD-10-CM | POA: Diagnosis not present

## 2024-07-26 DIAGNOSIS — Z0000 Encounter for general adult medical examination without abnormal findings: Secondary | ICD-10-CM | POA: Diagnosis not present

## 2024-07-26 DIAGNOSIS — E785 Hyperlipidemia, unspecified: Secondary | ICD-10-CM | POA: Diagnosis not present

## 2024-07-26 DIAGNOSIS — I5020 Unspecified systolic (congestive) heart failure: Secondary | ICD-10-CM | POA: Diagnosis not present

## 2024-07-26 DIAGNOSIS — I77819 Aortic ectasia, unspecified site: Secondary | ICD-10-CM | POA: Diagnosis not present

## 2024-07-26 DIAGNOSIS — Z7901 Long term (current) use of anticoagulants: Secondary | ICD-10-CM | POA: Diagnosis not present

## 2024-07-26 DIAGNOSIS — D6869 Other thrombophilia: Secondary | ICD-10-CM | POA: Diagnosis not present

## 2024-07-26 DIAGNOSIS — I4892 Unspecified atrial flutter: Secondary | ICD-10-CM | POA: Diagnosis not present

## 2024-07-26 DIAGNOSIS — Z1339 Encounter for screening examination for other mental health and behavioral disorders: Secondary | ICD-10-CM | POA: Diagnosis not present

## 2024-07-26 DIAGNOSIS — C61 Malignant neoplasm of prostate: Secondary | ICD-10-CM | POA: Diagnosis not present

## 2024-07-26 DIAGNOSIS — I700 Atherosclerosis of aorta: Secondary | ICD-10-CM | POA: Diagnosis not present

## 2024-07-26 DIAGNOSIS — I252 Old myocardial infarction: Secondary | ICD-10-CM | POA: Diagnosis not present

## 2024-07-26 MED ORDER — ALPRAZOLAM 0.25 MG PO TABS
0.2500 mg | ORAL_TABLET | Freq: Every evening | ORAL | 0 refills | Status: DC | PRN
  Filled 2024-07-26: qty 30, 30d supply, fill #0

## 2024-08-09 ENCOUNTER — Ambulatory Visit (INDEPENDENT_AMBULATORY_CARE_PROVIDER_SITE_OTHER)

## 2024-08-09 ENCOUNTER — Encounter: Payer: Self-pay | Admitting: Cardiology

## 2024-08-09 ENCOUNTER — Ambulatory Visit: Attending: Cardiology | Admitting: Cardiology

## 2024-08-09 ENCOUNTER — Other Ambulatory Visit: Payer: Self-pay

## 2024-08-09 ENCOUNTER — Other Ambulatory Visit (HOSPITAL_COMMUNITY): Payer: Self-pay

## 2024-08-09 VITALS — BP 144/80 | HR 52 | Ht 69.0 in | Wt 184.4 lb

## 2024-08-09 DIAGNOSIS — I480 Paroxysmal atrial fibrillation: Secondary | ICD-10-CM | POA: Diagnosis not present

## 2024-08-09 DIAGNOSIS — I4891 Unspecified atrial fibrillation: Secondary | ICD-10-CM | POA: Diagnosis not present

## 2024-08-09 DIAGNOSIS — R5383 Other fatigue: Secondary | ICD-10-CM | POA: Diagnosis not present

## 2024-08-09 DIAGNOSIS — R001 Bradycardia, unspecified: Secondary | ICD-10-CM | POA: Diagnosis not present

## 2024-08-09 MED ORDER — CARVEDILOL 6.25 MG PO TABS
6.2500 mg | ORAL_TABLET | Freq: Two times a day (BID) | ORAL | 3 refills | Status: DC
  Filled 2024-08-09: qty 180, 90d supply, fill #0

## 2024-08-09 NOTE — Progress Notes (Unsigned)
 Enrolled patient for a 14 day Zio XT  monitor to be mailed to patients home

## 2024-08-09 NOTE — Patient Instructions (Addendum)
 Medication Instructions:  Your physician has recommended you make the following change in your medication:  STOP: Metoprolol  Succinate (Toprol -XL) START: Carvedilol (Coreg) 6.25 mg twice daily  *If you need a refill on your cardiac medications before your next appointment, please call your pharmacy*  Lab Work: Vit D If you have labs (blood work) drawn today and your tests are completely normal, you will receive your results only by: MyChart Message (if you have MyChart) OR A paper copy in the mail If you have any lab test that is abnormal or we need to change your treatment, we will call you to review the results.  Testing/Procedures: Your physician has requested that you have an echocardiogram. Echocardiography is a painless test that uses sound waves to create images of your heart. It provides your doctor with information about the size and shape of your heart and how well your heart's chambers and valves are working. This procedure takes approximately one hour. There are no restrictions for this procedure. Please do NOT wear cologne, perfume, aftershave, or lotions (deodorant is allowed). Please arrive 15 minutes prior to your appointment time.  Please note: We ask at that you not bring children with you during ultrasound (echo/ vascular) testing. Due to room size and safety concerns, children are not allowed in the ultrasound rooms during exams. Our front office staff cannot provide observation of children in our lobby area while testing is being conducted. An adult accompanying a patient to their appointment will only be allowed in the ultrasound room at the discretion of the ultrasound technician under special circumstances. We apologize for any inconvenience.  ZIO XT- Long Term Monitor Instructions  Your physician has requested you wear a ZIO patch monitor for 14 days.  This is a single patch monitor. [employer] supplies one patch monitor per enrollment. Additional stickers are not  available. Please do not apply patch if you will be having a Nuclear Stress Test,  Echocardiogram, Cardiac CT, MRI, or Chest Xray during the period you would be wearing the  monitor. The patch cannot be worn during these tests. You cannot remove and re-apply the  ZIO XT patch monitor.  Your ZIO patch monitor will be mailed 3 day USPS to your address on file. It may take 3-5 days  to receive your monitor after you have been enrolled.  Once you have received your monitor, please review the enclosed instructions. Your monitor  has already been registered assigning a specific monitor serial # to you.  Billing and Patient Assistance Program Information  We have supplied [employer] with any of your insurance information on file for billing purposes. [employer] offers a sliding scale Patient Assistance Program for patients that do not have  insurance, or whose insurance does not completely cover the cost of the ZIO monitor.  You must apply for the Patient Assistance Program to qualify for this discounted rate.  To apply, please call [employer] at [phone], select option 4, select option 2, ask to apply for  Patient Assistance Program. Meredeth will ask your household income, and how many people  are in your household. They will quote your out-of-pocket cost based on that information.  [employer] will also be able to set up a 42-month, interest-free payment plan if needed.  Applying the monitor   Shave hair from upper left chest.  Hold abrader disc by orange tab. Rub abrader in 40 strokes over the upper left chest as  indicated in your monitor instructions.  Clean area with 4 enclosed alcohol pads.  Let dry.  Apply patch as indicated in monitor instructions. Patch will be placed under collarbone on left  side of chest with arrow pointing upward.  Rub patch adhesive wings for 2 minutes. Remove white label marked 1. Remove the white  label marked 2. Rub patch adhesive wings for 2 additional minutes.   While looking in a mirror, press and release button in center of patch. A small green light will  flash 3-4 times. This will be your only indicator that the monitor has been turned on.  Do not shower for the first 24 hours. You may shower after the first 24 hours.  Press the button if you feel a symptom. You will hear a small click. Record Date, Time and  Symptom in the Patient Logbook.  When you are ready to remove the patch, follow instructions on the last 2 pages of Patient  Logbook. Stick patch monitor onto the last page of Patient Logbook.  Place Patient Logbook in the blue and white box. Use locking tab on box and tape box closed  securely. The blue and white box has prepaid postage on it. Please place it in the mailbox as  soon as possible. Your physician should have your test results approximately 7 days after the  monitor has been mailed back to [facility].  Call [facility] Customer Care at [phone] if you have questions regarding  your ZIO XT patch monitor. Call them immediately if you see an orange light blinking on your  monitor.  If your monitor falls off in less than 4 days, contact our Monitor department at [phone].  If your monitor becomes loose or falls off after 4 days call [employer] at [phone] for  suggestions on securing your monitor   Follow-Up: At [facility], you and your health needs are our priority.  As part of our continuing mission to provide you with exceptional heart care, our providers are all part of one team.  This team includes your primary Cardiologist (physician) and Advanced Practice Providers or APPs (Physician Assistants and Nurse Practitioners) who all work together to provide you with the care you need, when you need it.  Your next appointment:   16 week(s)  Provider:   Kardie Tobb, DO

## 2024-08-09 NOTE — Progress Notes (Signed)
 Cardiology Office Note:    Date:  08/09/2024   ID:  Arthur Barrett, DOB August 08, 1956, MRN [id number]  PCP:  Onita Rush, MD  Cardiologist:  Dub Huntsman, DO  Electrophysiologist:  Soyla Gladis Norton, MD   Referring MD: Onita Rush, MD    I am Ok   History of Present Illness:    Arthur Barrett is a 68 y.o. male with a hx of paroxysmal atrial fibrillation/atrial flutter, was on flecainide  did not tolerate that medication, then amiodarone  which was stopped because he did not tolerate that as well he is status post ablation he is currently on Eliquis  5 mg twice daily and Toprol -XL 50 mg at bedtime.  Heart failure with reduced ejection fraction which improved post ablation.    Since his last visit with me in 2023 he had follow-up in our office with EP and did most recently had a virtual telephone visit with Lum Louis, NP.  At that visit he was cleared for an upcoming procedure.  Today he shared that he has been experiencing significant fatigue and dizziness.  He is concerned about this he notes that his Apple Watch has mentioned that he may be in A-fib at least 1 to 2% of the time.  No chest pain no reported shortness of breath.  Past Medical History:  Diagnosis Date   History of abnormal  myocardial perfusion imaging scan 12/26/2021   mild systolic dysfunction ef 46 % intermediate risk study see further results epic report   History of atrial fibrillation    s/p ablation 04-29-2022 with dr norton   History of COVID-19 03/2021   mild all symptoms resolved   History of kidney stones    yrs ago per pt   History of skull fracture    13 yrs ago tree fell on pt no surgery done no residual treated at [facility]   History of transesophageal echocardiography (TEE) 12/06/2021   successful cardioversion done, ef 50 to 55 %   Intermittent palpitations    Prostate cancer (HCC)    Wears glasses     Past Surgical History:  Procedure Laterality Date   APPENDECTOMY     60 yrs  agp per pt at [facility]   ATRIAL FIBRILLATION ABLATION N/A 04/29/2022   Procedure: ATRIAL FIBRILLATION ABLATION;  Surgeon: Norton Soyla Gladis, MD;  Location: [facility];  Service: Cardiovascular;  Laterality: N/A;   CARDIOVERSION N/A 12/06/2021   Procedure: CARDIOVERSION;  Surgeon: Mona Vinie BROCKS, MD;  Location: [facility] ENDOSCOPY;  Service: Cardiovascular;  Laterality: N/A;   colonscopy      2018 or 2019 normal per pt   CYSTOSCOPY  07/06/2023   Procedure: CYSTOSCOPY FLEXIBLE;  Surgeon: Selma Donnice SAUNDERS, MD;  Location: [facility];  Service: Urology;;  No seeds detected in bladder per Dr. Selma   CYSTOSCOPY/URETEROSCOPY/HOLMIUM LASER/STENT PLACEMENT Right 02/03/2022   Procedure: CYSTOSCOPY/RETROGRADE/URETEROSCOPY/HOLMIUM LASER/STENT PLACEMENT;  Surgeon: Selma Donnice SAUNDERS, MD;  Location: [facility];  Service: Urology;  Laterality: Right;   CYSTOSCOPY/URETEROSCOPY/HOLMIUM LASER/STENT PLACEMENT Right 02/17/2022   Procedure: CYSTOSCOPY/ RETROGRADE/URETEROSCOPY/STENT EXCHANGED;  Surgeon: Selma Donnice SAUNDERS, MD;  Location: [facility];  Service: Urology;  Laterality: Right;   LUMBAR LAMINECTOMY/DECOMPRESSION MICRODISCECTOMY Left 01/31/2015   Procedure: MICRO LUMBAR DECOMPRESSION LUMBAR FIVE TO SACRAL ONE ON LEFT;  Surgeon: Reyes Billing, MD;  Location: [facility];  Service: Orthopedics;  Laterality: Left;   PROSTATE BIOPSY N/A 02/03/2022   Procedure: BIOPSY TRANSRECTAL ULTRASONIC PROSTATE (TUBP);  Surgeon: Selma Donnice  R, MD;  Location: [facility];  Service: Urology;  Laterality: N/A;   RADIOACTIVE SEED IMPLANT N/A 07/06/2023   Procedure: RADIOACTIVE SEED IMPLANT/BRACHYTHERAPY IMPLANT;  Surgeon: Selma Donnice SAUNDERS, MD;  Location: [facility];  Service: Urology;  Laterality: N/A;  90 MINUTES NEEDED FOR CASE   SPACE OAR INSTILLATION N/A 07/06/2023   Procedure: SPACE OAR INSTILLATION;  Surgeon: Selma Donnice SAUNDERS, MD;  Location: [facility];  Service: Urology;  Laterality: N/A;   TEE WITHOUT CARDIOVERSION N/A 12/06/2021   Procedure: TRANSESOPHAGEAL ECHOCARDIOGRAM (TEE);  Surgeon: Mona Vinie BROCKS, MD;  Location: [facility] ENDOSCOPY;  Service: Cardiovascular;  Laterality: N/A;    Current Medications: Current Meds  Medication Sig   ALPRAZolam  (XANAX ) 0.25 MG tablet Take 1 tablet (0.25 mg total) by mouth at bedtime as needed as directed   apixaban  (ELIQUIS ) 5 MG TABS tablet Take 1 tablet (5 mg total) by mouth 2 (two) times daily.   Ascorbic Acid (VITAMIN C PO) Take 1 tablet by mouth daily.   atorvastatin  (LIPITOR) 20 MG tablet Take 1 tablet (20 mg total) by mouth at bedtime. Please keep your appt on 10/21 with Dr. Sheena   diphenhydrAMINE  (BENADRYL ) 25 mg capsule Take 25 mg by mouth at bedtime.   docusate sodium  (COLACE) 100 MG capsule Take 1 capsule (100 mg total) by mouth daily as needed   fluticasone (FLONASE) 50 MCG/ACT nasal spray Place 1 spray into both nostrils at bedtime.   GLUCOSAMINE-CHONDROITIN PO Take 2 tablets by mouth daily.   HYDROcodone -acetaminophen  (NORCO/VICODIN) 5-325 MG tablet Take 1/2 - 1 tablet by mouth every 6 (six) hours as needed.   metoprolol  succinate (TOPROL -XL) 50 MG 24 hr tablet Take 1 tablet (50 mg total) by mouth at bedtime. Take with or immediately following a meal.   mirabegron  ER (MYRBETRIQ ) 25 MG TB24 tablet Take 1 tablet (25 mg total) by mouth daily.   Multiple Vitamins-Minerals (MULTIVITAMIN WITH MINERALS) tablet Take 1 tablet by mouth daily. men's   Omega-3 Fatty Acids (FISH OIL) 1000 MG CAPS Take 2,000 mg by mouth daily.   tamsulosin  (FLOMAX ) 0.4 MG CAPS capsule Take 1 capsule (0.4 mg total) by mouth 2 (two) times daily.     Allergies:   Patient has no known allergies.   Social History   Socioeconomic History   Marital status: Widowed    Spouse name: Not on file   Number of children: Not on file   Years of education: Not on file   Highest education level: Not on file  Occupational  History   Occupation: retired  Tobacco Use   Smoking status: Never   Smokeless tobacco: Never  Vaping Use   Vaping status: Never Used  Substance and Sexual Activity   Alcohol use: No   Drug use: No   Sexual activity: Not on file  Other Topics Concern   Not on file  Social History Narrative   Not on file   Social Drivers of Health   Financial Resource Strain: Not on file  Food Insecurity: No Food Insecurity (07/29/2023)   Hunger Vital Sign    Worried About Running Out of Food in the Last Year: Never true    Ran Out of Food in the Last Year: Never true  Transportation Needs: No Transportation Needs (07/29/2023)   PRAPARE - Administrator, Civil Service (Medical): No    Lack of Transportation (Non-Medical): No  Physical Activity: Not on file  Stress: Not on file  Social  Connections: Not on file     Family History: The patient's Family history is unknown by patient.  ROS:   Review of Systems  Constitution: Negative for decreased appetite, fever and weight gain.  HENT: Negative for congestion, ear discharge, hoarse voice and sore throat.   Eyes: Negative for discharge, redness, vision loss in right eye and visual halos.  Cardiovascular: Negative for chest pain, dyspnea on exertion, leg swelling, orthopnea and palpitations.  Respiratory: Negative for cough, hemoptysis, shortness of breath and snoring.   Endocrine: Negative for heat intolerance and polyphagia.  Hematologic/Lymphatic: Negative for bleeding problem. Does not bruise/bleed easily.  Skin: Negative for flushing, nail changes, rash and suspicious lesions.  Musculoskeletal: Negative for arthritis, joint pain, muscle cramps, myalgias, neck pain and stiffness.  Gastrointestinal: Negative for abdominal pain, bowel incontinence, diarrhea and excessive appetite.  Genitourinary: Negative for decreased libido, genital sores and incomplete emptying.  Neurological: Negative for brief paralysis, focal weakness,  headaches and loss of balance.  Psychiatric/Behavioral: Negative for altered mental status, depression and suicidal ideas.  Allergic/Immunologic: Negative for HIV exposure and persistent infections.    EKGs/Labs/Other Studies Reviewed:    The following studies were reviewed today:   EKG:  None today   TEE2/17/2023 IMPRESSIONS   1. Left ventricular ejection fraction, by estimation, is 50 to 55%. The  left ventricle has low normal function.   2. Right ventricular systolic function is normal. The right ventricular  size is normal.   3. Left atrial size was moderately dilated. No left atrial/left atrial  appendage thrombus was detected.   4. The mitral valve is grossly normal. Trivial mitral valve  regurgitation.   5. The aortic valve is tricuspid. Aortic valve regurgitation is not  visualized.   6. Aortic dilatation noted. There is borderline dilatation of the  ascending aorta, measuring 38 mm.   Conclusion(s)/Recommendation(s): No LA/LAA thrombus identified. Successful  cardioversion performed with restoration of normal sinus rhythm.   FINDINGS   Left Ventricle: Left ventricular ejection fraction, by estimation, is 50  to 55%. The left ventricle has low normal function. The left ventricular  internal cavity size was normal in size. There is no left ventricular  hypertrophy.   Right Ventricle: The right ventricular size is normal. No increase in  right ventricular wall thickness. Right ventricular systolic function is  normal.   Left Atrium: Left atrial size was moderately dilated. No left atrial/left  atrial appendage thrombus was detected.   Right Atrium: Right atrial size was normal in size.   Pericardium: There is no evidence of pericardial effusion.   Mitral Valve: The mitral valve is grossly normal. Trivial mitral valve  regurgitation.   Tricuspid Valve: The tricuspid valve is grossly normal. Tricuspid valve  regurgitation is trivial.   Aortic Valve: The aortic  valve is tricuspid. Aortic valve regurgitation is  not visualized.   Pulmonic Valve: The pulmonic valve was normal in structure. Pulmonic valve  regurgitation is not visualized.   Aorta: Aortic dilatation noted. There is borderline dilatation of the  ascending aorta, measuring 38 mm.   IAS/Shunts: No atrial level shunt detected by color flow Doppler.         LV Volumes (MOD)  LV vol d, MOD A4C: 80.3 ml  LV vol s, MOD A4C: 38.5 ml  LV SV MOD A4C:     80.3 ml   Vinie Maxcy MD  Electronically signed by Vinie Maxcy MD  Signature Date/Time: 12/06/2021/3:19:14 PM         Pharmacologic  nuclear stress test December 24, 2021   Findings are consistent with prior myocardial infarction. The study is intermediate risk.   No ST deviation was noted.   LV perfusion is abnormal. Defect 1: There is a small defect with mild reduction in uptake present in the apex location(s) that is fixed. There is abnormal wall motion in the defect area. Consistent with infarction. Defect 2: There is a medium defect with mild reduction in uptake present in the apical to basal inferior location(s) that is fixed. There is normal wall motion in the defect area. Consistent with artifact caused by diaphragmatic attenuation.   Left ventricular function is abnormal. End diastolic cavity size is mildly enlarged. End systolic cavity size is mildly enlarged.   Prior study not available for comparison.   Fixed perfusion defect at apex with hypokinesis consistent with infarct Fixed inferior perfusion defect with normal wall motion suggests artifact Mild systolic dysfunction (EF 46%) Intermediate risk study due to systolic dysfunction.  No ischemia.    Recent Labs: No results found for requested labs within last 365 days.  Recent Lipid Panel    Component Value Date/Time   CHOL 178 12/05/2021 0331   TRIG 80 12/05/2021 0331   HDL 38 (L) 12/05/2021 0331   CHOLHDL 4.7 12/05/2021 0331   VLDL 16 12/05/2021 0331   LDLCALC  124 (H) 12/05/2021 0331    Physical Exam:    VS:  BP (!) 144/80 (BP Location: Left Arm, Patient Position: Sitting, Cuff Size: Normal)   Pulse (!) 52   Ht 5' 9 (1.753 m)   Wt 184 lb 6.4 oz (83.6 kg)   SpO2 97%   BMI 27.23 kg/m     Wt Readings from Last 3 Encounters:  08/09/24 184 lb 6.4 oz (83.6 kg)  08/17/23 166 lb 12.8 oz (75.7 kg)  07/29/23 163 lb 6.4 oz (74.1 kg)     GEN: Well nourished, well developed in no acute distress HEENT: Normal NECK: No JVD; No carotid bruits LYMPHATICS: No lymphadenopathy CARDIAC: S1S2 noted,RRR, no murmurs, rubs, gallops RESPIRATORY:  Clear to auscultation without rales, wheezing or rhonchi  ABDOMEN: Soft, non-tender, non-distended, +bowel sounds, no guarding. EXTREMITIES: No edema, No cyanosis, no clubbing MUSCULOSKELETAL:  No deformity  SKIN: Warm and dry NEUROLOGIC:  Alert and oriented x 3, non-focal PSYCHIATRIC:  Normal affect, good insight  ASSESSMENT:    1. Atrial fibrillation with RVR (HCC)   2. PAF (paroxysmal atrial fibrillation) (HCC)     PLAN:    Given his symptoms of fatigue as well as dizziness and reported possible A-fib per his Apple Watch/there were no strips for me to be able to review at this time.  I am going to place a monitor on the patient for 14 days.  Heart rate in the office today is 52 bpm-I will switch the patient from Toprol -XL to carvedilol 6.25 mg twice daily given his elevated blood pressure in the setting of his bradycardia as well.  For stroke prevention continue the patient on his Eliquis  5 mg twice daily.  In the meantime a repeat echocardiogram would be appropriate at this time.  To reassess his LV function.   The patient is in agreement with the above plan. The patient left the office in stable condition.  The patient will follow up in 1 year   Medication Adjustments/Labs and Tests Ordered: Current medicines are reviewed at length with the patient today.  Concerns regarding medicines are outlined  above.  Orders Placed This Encounter  Procedures  EKG 12-Lead   No orders of the defined types were placed in this encounter.   There are no Patient Instructions on file for this visit.   Adopting a Healthy Lifestyle.  Know what a healthy weight is for you (roughly BMI <25) and aim to maintain this   Aim for 7+ servings of fruits and vegetables daily   65-80+ fluid ounces of water  or unsweet tea for healthy kidneys   Limit to max 1 drink of alcohol per day; avoid smoking/tobacco   Limit animal fats in diet for cholesterol and heart health - choose grass fed whenever available   Avoid highly processed foods, and foods high in saturated/trans fats   Aim for low stress - take time to unwind and care for your mental health   Aim for 150 min of moderate intensity exercise weekly for heart health, and weights twice weekly for bone health   Aim for 7-9 hours of sleep daily   When it comes to diets, agreement about the perfect plan isnt easy to find, even among the experts. Experts at the [facility] [employer] developed an idea known as the Healthy Eating Plate. Just imagine a plate divided into logical, healthy portions.   The emphasis is on diet quality:   Load up on vegetables and fruits - one-half of your plate: Aim for color and variety, and remember that potatoes dont count.   Go for whole grains - one-quarter of your plate: Whole wheat, barley, wheat berries, quinoa, oats, brown rice, and foods made with them. If you want pasta, go with whole wheat pasta.   Protein power - one-quarter of your plate: Fish, chicken, beans, and nuts are all healthy, versatile protein sources. Limit red meat.   The diet, however, does go beyond the plate, offering a few other suggestions.   Use healthy plant oils, such as olive, canola, soy, corn, sunflower and peanut. Check the labels, and avoid partially hydrogenated oil, which have unhealthy trans fats.   If youre thirsty,  drink water . Coffee and tea are good in moderation, but skip sugary drinks and limit milk and dairy products to one or two daily servings.   The type of carbohydrate in the diet is more important than the amount. Some sources of carbohydrates, such as vegetables, fruits, whole grains, and beans-are healthier than others.   Finally, stay active  Signed, Dub Huntsman, DO  08/09/2024 11:15 AM    [facility]

## 2024-08-10 LAB — VITAMIN D 25 HYDROXY (VIT D DEFICIENCY, FRACTURES): Vit D, 25-Hydroxy: 35.7 ng/mL (ref 30.0–100.0)

## 2024-08-11 ENCOUNTER — Ambulatory Visit: Payer: Self-pay | Admitting: Cardiology

## 2024-08-13 ENCOUNTER — Other Ambulatory Visit (HOSPITAL_COMMUNITY): Payer: Self-pay

## 2024-08-14 ENCOUNTER — Other Ambulatory Visit: Payer: Self-pay | Admitting: Cardiology

## 2024-08-14 DIAGNOSIS — I4891 Unspecified atrial fibrillation: Secondary | ICD-10-CM

## 2024-08-15 ENCOUNTER — Other Ambulatory Visit: Payer: Self-pay

## 2024-08-15 ENCOUNTER — Other Ambulatory Visit (HOSPITAL_COMMUNITY): Payer: Self-pay

## 2024-08-15 MED ORDER — APIXABAN 5 MG PO TABS
5.0000 mg | ORAL_TABLET | Freq: Two times a day (BID) | ORAL | 1 refills | Status: AC
  Filled 2024-08-15: qty 180, 90d supply, fill #0
  Filled 2024-11-11 – 2024-11-15 (×2): qty 180, 90d supply, fill #1

## 2024-08-15 NOTE — Telephone Encounter (Signed)
 Prescription refill request for Eliquis  received. Indication: PAF Last office visit: 08/09/24  MARLA Tobb MD Scr: 0.9 on 07/06/23  Epic Age: 68 Weight: 83.6kg  Based on above findings Eliquis  5mg  twice daily is the appropriate dose.  Refill approved.

## 2024-08-20 ENCOUNTER — Other Ambulatory Visit (HOSPITAL_BASED_OUTPATIENT_CLINIC_OR_DEPARTMENT_OTHER): Payer: Self-pay

## 2024-08-22 ENCOUNTER — Other Ambulatory Visit (HOSPITAL_BASED_OUTPATIENT_CLINIC_OR_DEPARTMENT_OTHER): Payer: Self-pay

## 2024-08-23 ENCOUNTER — Other Ambulatory Visit (HOSPITAL_BASED_OUTPATIENT_CLINIC_OR_DEPARTMENT_OTHER): Payer: Self-pay

## 2024-08-23 MED ORDER — ALPRAZOLAM 0.25 MG PO TABS
0.2500 mg | ORAL_TABLET | Freq: Every evening | ORAL | 0 refills | Status: DC | PRN
  Filled 2024-08-24: qty 30, 30d supply, fill #0

## 2024-08-24 ENCOUNTER — Other Ambulatory Visit (HOSPITAL_BASED_OUTPATIENT_CLINIC_OR_DEPARTMENT_OTHER): Payer: Self-pay

## 2024-08-27 ENCOUNTER — Other Ambulatory Visit (HOSPITAL_COMMUNITY): Payer: Self-pay

## 2024-08-31 DIAGNOSIS — I480 Paroxysmal atrial fibrillation: Secondary | ICD-10-CM | POA: Diagnosis not present

## 2024-09-01 ENCOUNTER — Other Ambulatory Visit (HOSPITAL_BASED_OUTPATIENT_CLINIC_OR_DEPARTMENT_OTHER): Payer: Self-pay

## 2024-09-01 MED ORDER — TAMSULOSIN HCL 0.4 MG PO CAPS
0.4000 mg | ORAL_CAPSULE | Freq: Every day | ORAL | 0 refills | Status: AC
  Filled 2024-09-01: qty 90, 90d supply, fill #0

## 2024-09-02 ENCOUNTER — Other Ambulatory Visit (HOSPITAL_COMMUNITY): Payer: Self-pay

## 2024-09-03 ENCOUNTER — Other Ambulatory Visit (HOSPITAL_COMMUNITY): Payer: Self-pay

## 2024-09-05 ENCOUNTER — Ambulatory Visit: Attending: Cardiology | Admitting: Cardiology

## 2024-09-05 ENCOUNTER — Encounter: Payer: Self-pay | Admitting: Cardiology

## 2024-09-05 VITALS — BP 120/88 | HR 58 | Ht 69.0 in | Wt 181.0 lb

## 2024-09-05 DIAGNOSIS — I480 Paroxysmal atrial fibrillation: Secondary | ICD-10-CM | POA: Diagnosis not present

## 2024-09-05 DIAGNOSIS — D6869 Other thrombophilia: Secondary | ICD-10-CM

## 2024-09-05 DIAGNOSIS — I483 Typical atrial flutter: Secondary | ICD-10-CM

## 2024-09-05 NOTE — Progress Notes (Signed)
 Electrophysiology Office Note:   Date:  09/05/2024  ID:  Arthur Barrett, DOB [birth year], MRN [id number]  Primary Cardiologist: Kardie Tobb, DO Primary Heart Failure: None Electrophysiologist: Jenipher Havel Gladis Norton, MD      History of Present Illness:   Arthur Barrett is a 68 y.o. male with h/o atrial fibrillation/flutter, chronic systolic heart failure, hypothyroidism seen today for routine electrophysiology followup.   Discussed the use of AI scribe software for clinical note transcription with the patient, who gave verbal consent to proceed.  History of Present Illness Arthur Barrett is a 68 year old male who presents for follow-up of heart rhythm monitoring.  He was placed on a heart monitor for a few weeks due to some complaints. He was told that the results showed no atrial fibrillation, but there were a few fast rhythms from the top chambers of his heart, each lasting less than 30 seconds. His phone sometimes shows a high heart rate, but these episodes are brief, lasting only a minute or so.  He feels lightheaded but not dizzy after exercising, particularly after about 20 minutes of working out in the mornings. This sensation occurs when he is finishing his routine, such as touching his toes, and it resolves quickly. He also feels very tired after physical activities likecutting trees, and splitting wood for about an hour and a half, which he did not find unusual.  He is concerned about feeling 'wiped out' and 'totally fatigued' during the last two days of wearing the monitor, despite not engaging in any physical activity at the time. He is unsure of the cause of this fatigue. He notes that his Apple Watch shows a less than 2% atrial fibrillation burden, which is the lowest reading the device can display.  No symptoms during the brief episodes of fast heart rhythms. He feels lightheaded but not dizzy after exercise, and he does not experience symptoms during the brief  episodes of increased heart rate.  he denies chest pain, palpitations, dyspnea, PND, orthopnea, nausea, vomiting, dizziness, syncope, edema, weight gain, or early satiety.   Review of systems complete and found to be negative unless listed in HPI.   EP Information / Studies Reviewed:    EKG is not ordered today. EKG from 08/09/2024 reviewed which showed sinus rhythm       Risk Assessment/Calculations:    CHA2DS2-VASc Score = 3   This indicates a 3.2% annual risk of stroke. The patient's score is based upon: CHF History: 1 HTN History: 0 Diabetes History: 0 Stroke History: 0 Vascular Disease History: 1 Age Score: 1 Gender Score: 0            Physical Exam:   VS:  There were no vitals taken for this visit.   Wt Readings from Last 3 Encounters:  08/09/24 184 lb 6.4 oz (83.6 kg)  08/17/23 166 lb 12.8 oz (75.7 kg)  07/29/23 163 lb 6.4 oz (74.1 kg)     GEN: Well nourished, well developed in no acute distress NECK: No JVD; No carotid bruits CARDIAC: Regular rate and rhythm, no murmurs, rubs, gallops RESPIRATORY:  Clear to auscultation without rales, wheezing or rhonchi  ABDOMEN: Soft, non-tender, non-distended EXTREMITIES:  No edema; No deformity   ASSESSMENT AND PLAN:    1.  Paroxysmal atrial fibrillation/typical atrial flutter: Post ablation in 04/29/2022.  Recent cardiac monitor-personally reviewed without evidence of atrial fibrillation.  Did have up to 30 seconds of SVT 160 bpm.  He is unaware of these episodes.  As he has not had any further A-fib, no changes at this time.  2.  Chronic systolic heart failure: Repeat echo with normalized ejection fraction.  Continue current management.  3.  Secondary hypercoagulable state: On Eliquis  for atrial fibrillation  4.  Hypothyroidism: Previously on amiodarone .  Further plan per primary physician  Follow up with EP Team in 12 months  Signed, Omunique Pederson Gladis Norton, MD

## 2024-09-06 ENCOUNTER — Other Ambulatory Visit (HOSPITAL_BASED_OUTPATIENT_CLINIC_OR_DEPARTMENT_OTHER): Payer: Self-pay

## 2024-09-06 ENCOUNTER — Other Ambulatory Visit (HOSPITAL_COMMUNITY): Payer: Self-pay

## 2024-09-06 DIAGNOSIS — R3915 Urgency of urination: Secondary | ICD-10-CM | POA: Diagnosis not present

## 2024-09-06 DIAGNOSIS — N400 Enlarged prostate without lower urinary tract symptoms: Secondary | ICD-10-CM | POA: Diagnosis not present

## 2024-09-06 DIAGNOSIS — N401 Enlarged prostate with lower urinary tract symptoms: Secondary | ICD-10-CM | POA: Diagnosis not present

## 2024-09-06 MED ORDER — TAMSULOSIN HCL 0.4 MG PO CAPS
0.4000 mg | ORAL_CAPSULE | Freq: Two times a day (BID) | ORAL | 3 refills | Status: AC
  Filled 2024-09-06 – 2024-10-14 (×7): qty 180, 90d supply, fill #0
  Filled ????-??-??: fill #0

## 2024-09-07 ENCOUNTER — Other Ambulatory Visit (HOSPITAL_COMMUNITY): Payer: Self-pay

## 2024-09-08 DIAGNOSIS — M7912 Myalgia of auxiliary muscles, head and neck: Secondary | ICD-10-CM | POA: Diagnosis not present

## 2024-09-08 DIAGNOSIS — M9901 Segmental and somatic dysfunction of cervical region: Secondary | ICD-10-CM | POA: Diagnosis not present

## 2024-09-08 DIAGNOSIS — M5387 Other specified dorsopathies, lumbosacral region: Secondary | ICD-10-CM | POA: Diagnosis not present

## 2024-09-08 DIAGNOSIS — M9902 Segmental and somatic dysfunction of thoracic region: Secondary | ICD-10-CM | POA: Diagnosis not present

## 2024-09-08 DIAGNOSIS — M9904 Segmental and somatic dysfunction of sacral region: Secondary | ICD-10-CM | POA: Diagnosis not present

## 2024-09-08 DIAGNOSIS — M79672 Pain in left foot: Secondary | ICD-10-CM | POA: Diagnosis not present

## 2024-09-08 DIAGNOSIS — M9905 Segmental and somatic dysfunction of pelvic region: Secondary | ICD-10-CM | POA: Diagnosis not present

## 2024-09-08 DIAGNOSIS — M9906 Segmental and somatic dysfunction of lower extremity: Secondary | ICD-10-CM | POA: Diagnosis not present

## 2024-09-08 DIAGNOSIS — M9903 Segmental and somatic dysfunction of lumbar region: Secondary | ICD-10-CM | POA: Diagnosis not present

## 2024-09-12 ENCOUNTER — Other Ambulatory Visit: Payer: Self-pay | Admitting: Cardiology

## 2024-09-13 ENCOUNTER — Ambulatory Visit (HOSPITAL_COMMUNITY)
Admission: RE | Admit: 2024-09-13 | Discharge: 2024-09-13 | Disposition: A | Discharge status: Home / Self Care | Source: Ambulatory Visit | Attending: Cardiology | Admitting: Cardiology

## 2024-09-13 DIAGNOSIS — E785 Hyperlipidemia, unspecified: Secondary | ICD-10-CM | POA: Insufficient documentation

## 2024-09-13 DIAGNOSIS — I509 Heart failure, unspecified: Secondary | ICD-10-CM | POA: Diagnosis not present

## 2024-09-13 DIAGNOSIS — I4892 Unspecified atrial flutter: Secondary | ICD-10-CM | POA: Insufficient documentation

## 2024-09-13 DIAGNOSIS — Z9889 Other specified postprocedural states: Secondary | ICD-10-CM | POA: Insufficient documentation

## 2024-09-13 DIAGNOSIS — I480 Paroxysmal atrial fibrillation: Secondary | ICD-10-CM | POA: Diagnosis not present

## 2024-09-13 DIAGNOSIS — R0609 Other forms of dyspnea: Secondary | ICD-10-CM | POA: Insufficient documentation

## 2024-09-13 DIAGNOSIS — I4891 Unspecified atrial fibrillation: Secondary | ICD-10-CM | POA: Insufficient documentation

## 2024-09-13 DIAGNOSIS — R5383 Other fatigue: Secondary | ICD-10-CM | POA: Insufficient documentation

## 2024-09-13 LAB — ECHOCARDIOGRAM COMPLETE
AR max vel: 2.6 cm2
AV Area VTI: 2.78 cm2
AV Area mean vel: 2.9 cm2
AV Mean grad: 5 mmHg
AV Peak grad: 12.4 mmHg
Ao pk vel: 1.76 m/s
Area-P 1/2: 3.95 cm2
S' Lateral: 2.32 cm

## 2024-09-14 ENCOUNTER — Other Ambulatory Visit: Payer: Self-pay

## 2024-09-14 ENCOUNTER — Other Ambulatory Visit (HOSPITAL_COMMUNITY): Payer: Self-pay

## 2024-09-14 MED ORDER — MIRABEGRON ER 25 MG PO TB24
25.0000 mg | ORAL_TABLET | Freq: Every day | ORAL | 0 refills | Status: DC
  Filled 2024-09-14: qty 30, 30d supply, fill #0

## 2024-09-17 ENCOUNTER — Encounter: Payer: Self-pay | Admitting: Cardiology

## 2024-09-19 ENCOUNTER — Other Ambulatory Visit (HOSPITAL_COMMUNITY): Payer: Self-pay

## 2024-09-19 ENCOUNTER — Other Ambulatory Visit: Payer: Self-pay

## 2024-09-19 MED ORDER — ATORVASTATIN CALCIUM 20 MG PO TABS
20.0000 mg | ORAL_TABLET | Freq: Every day | ORAL | 3 refills | Status: AC
  Filled 2024-09-19: qty 90, 90d supply, fill #0

## 2024-09-23 ENCOUNTER — Other Ambulatory Visit (HOSPITAL_COMMUNITY): Payer: Self-pay

## 2024-09-23 ENCOUNTER — Other Ambulatory Visit: Payer: Self-pay

## 2024-09-23 MED ORDER — CARVEDILOL 12.5 MG PO TABS
12.5000 mg | ORAL_TABLET | Freq: Two times a day (BID) | ORAL | 1 refills | Status: AC
  Filled 2024-09-23: qty 180, 90d supply, fill #0

## 2024-09-24 ENCOUNTER — Other Ambulatory Visit (HOSPITAL_BASED_OUTPATIENT_CLINIC_OR_DEPARTMENT_OTHER): Payer: Self-pay

## 2024-09-26 ENCOUNTER — Other Ambulatory Visit (HOSPITAL_BASED_OUTPATIENT_CLINIC_OR_DEPARTMENT_OTHER): Payer: Self-pay

## 2024-09-26 MED ORDER — ALPRAZOLAM 0.25 MG PO TABS
0.2500 mg | ORAL_TABLET | Freq: Every evening | ORAL | 0 refills | Status: DC | PRN
  Filled 2024-09-26: qty 30, 30d supply, fill #0

## 2024-10-05 ENCOUNTER — Other Ambulatory Visit: Payer: Self-pay

## 2024-10-06 ENCOUNTER — Other Ambulatory Visit: Payer: Self-pay

## 2024-10-06 ENCOUNTER — Other Ambulatory Visit (HOSPITAL_COMMUNITY): Payer: Self-pay

## 2024-10-07 ENCOUNTER — Other Ambulatory Visit (HOSPITAL_COMMUNITY): Payer: Self-pay

## 2024-10-08 ENCOUNTER — Other Ambulatory Visit (HOSPITAL_COMMUNITY): Payer: Self-pay

## 2024-10-09 ENCOUNTER — Other Ambulatory Visit (HOSPITAL_COMMUNITY): Payer: Self-pay

## 2024-10-11 ENCOUNTER — Other Ambulatory Visit (HOSPITAL_COMMUNITY): Payer: Self-pay

## 2024-10-14 ENCOUNTER — Other Ambulatory Visit (HOSPITAL_COMMUNITY): Payer: Self-pay

## 2024-10-18 ENCOUNTER — Other Ambulatory Visit (HOSPITAL_BASED_OUTPATIENT_CLINIC_OR_DEPARTMENT_OTHER): Payer: Self-pay

## 2024-10-18 MED ORDER — GEMTESA 75 MG PO TABS
1.0000 | ORAL_TABLET | Freq: Every day | ORAL | 3 refills | Status: AC
  Filled 2024-10-18: qty 90, 90d supply, fill #0

## 2024-10-22 ENCOUNTER — Other Ambulatory Visit (HOSPITAL_BASED_OUTPATIENT_CLINIC_OR_DEPARTMENT_OTHER): Payer: Self-pay

## 2024-10-24 ENCOUNTER — Other Ambulatory Visit (HOSPITAL_BASED_OUTPATIENT_CLINIC_OR_DEPARTMENT_OTHER): Payer: Self-pay

## 2024-10-26 ENCOUNTER — Other Ambulatory Visit (HOSPITAL_BASED_OUTPATIENT_CLINIC_OR_DEPARTMENT_OTHER): Payer: Self-pay

## 2024-10-26 MED ORDER — ALPRAZOLAM 0.25 MG PO TABS
0.2500 mg | ORAL_TABLET | Freq: Every evening | ORAL | 0 refills | Status: AC | PRN
  Filled 2024-10-26: qty 30, 30d supply, fill #0

## 2024-11-13 ENCOUNTER — Other Ambulatory Visit (HOSPITAL_COMMUNITY): Payer: Self-pay

## 2024-11-14 ENCOUNTER — Other Ambulatory Visit: Payer: Self-pay

## 2024-11-14 ENCOUNTER — Other Ambulatory Visit (HOSPITAL_COMMUNITY): Payer: Self-pay

## 2024-11-15 ENCOUNTER — Other Ambulatory Visit (HOSPITAL_COMMUNITY): Payer: Self-pay

## 2024-11-15 ENCOUNTER — Other Ambulatory Visit: Payer: Self-pay

## 2024-11-24 ENCOUNTER — Other Ambulatory Visit (HOSPITAL_COMMUNITY): Payer: Self-pay

## 2024-12-01 ENCOUNTER — Ambulatory Visit: Admitting: Cardiology
